# Patient Record
Sex: Female | Born: 1960 | Hispanic: No | Marital: Married | Smoking: Current every day smoker
Health system: Southern US, Community
[De-identification: ages and names within clinical notes are randomized; demographics above are authoritative.]

## PROBLEM LIST (undated history)

## (undated) DIAGNOSIS — E7800 Pure hypercholesterolemia, unspecified: Secondary | ICD-10-CM

## (undated) DIAGNOSIS — I639 Cerebral infarction, unspecified: Secondary | ICD-10-CM

## (undated) HISTORY — PX: ABDOMINAL HYSTERECTOMY: SHX81

---

## 2002-02-25 ENCOUNTER — Other Ambulatory Visit: Admission: RE | Admit: 2002-02-25 | Discharge: 2002-02-25 | Payer: Self-pay | Admitting: Obstetrics and Gynecology

## 2009-01-30 ENCOUNTER — Emergency Department (HOSPITAL_BASED_OUTPATIENT_CLINIC_OR_DEPARTMENT_OTHER): Admission: EM | Admit: 2009-01-30 | Discharge: 2009-01-30 | Payer: Self-pay | Admitting: Emergency Medicine

## 2012-04-27 IMAGING — CR LEFT HIP - COMPLETE 2+ VIEW
3 series · 3 of 3 positions shown · non-contrast
Comparison: None.

CLINICAL DATA: Left hip pain.

EXAM:
LEFT HIP - COMPLETE 2+ VIEW

[t pelvis a.p.]
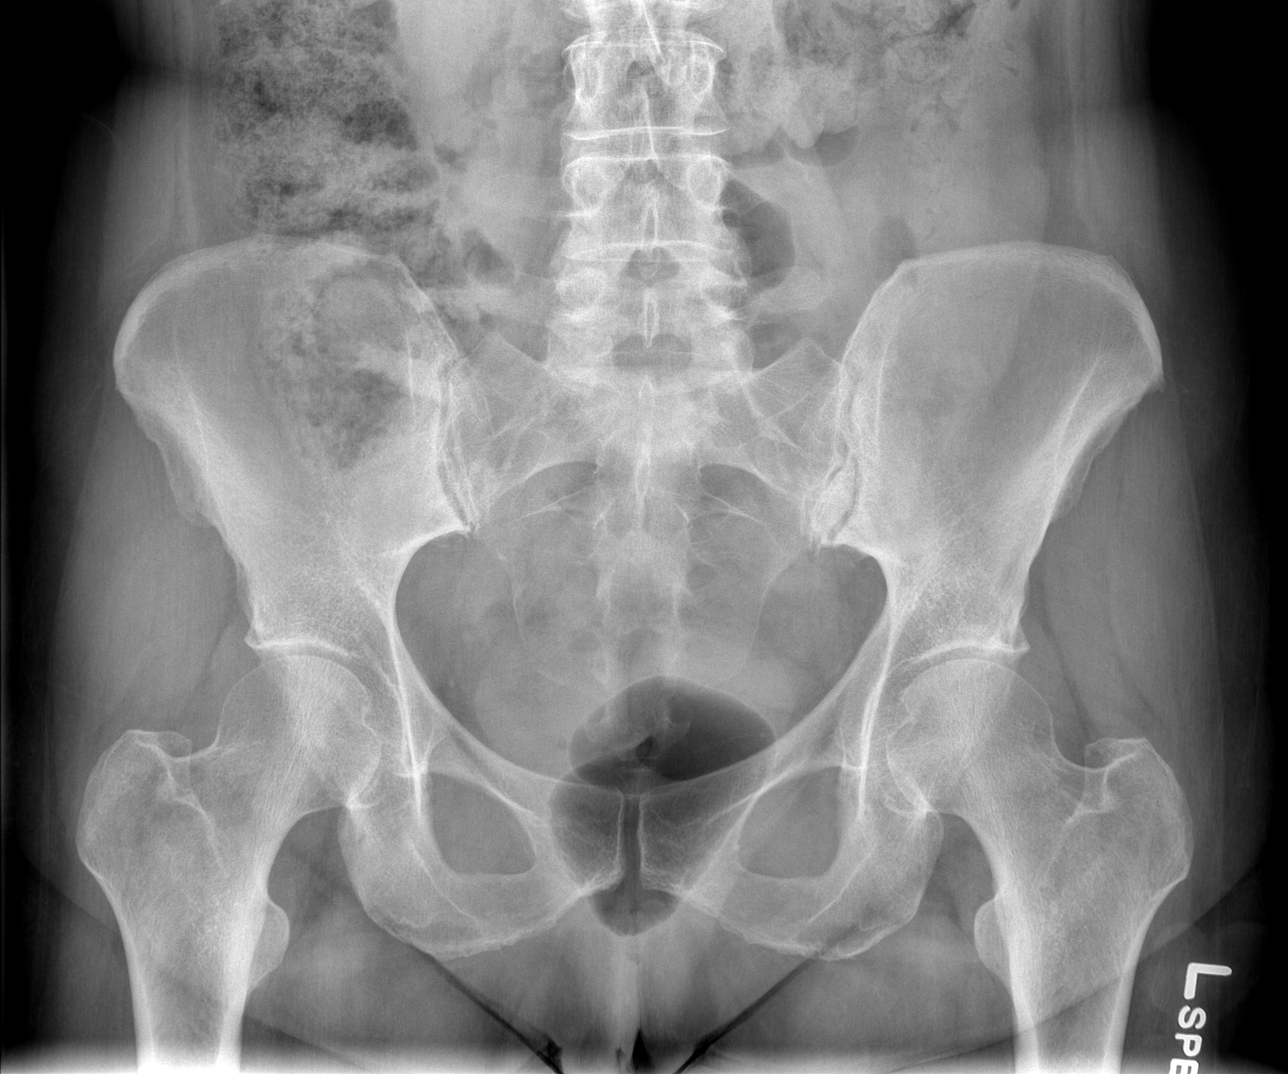

[t hip ap left]
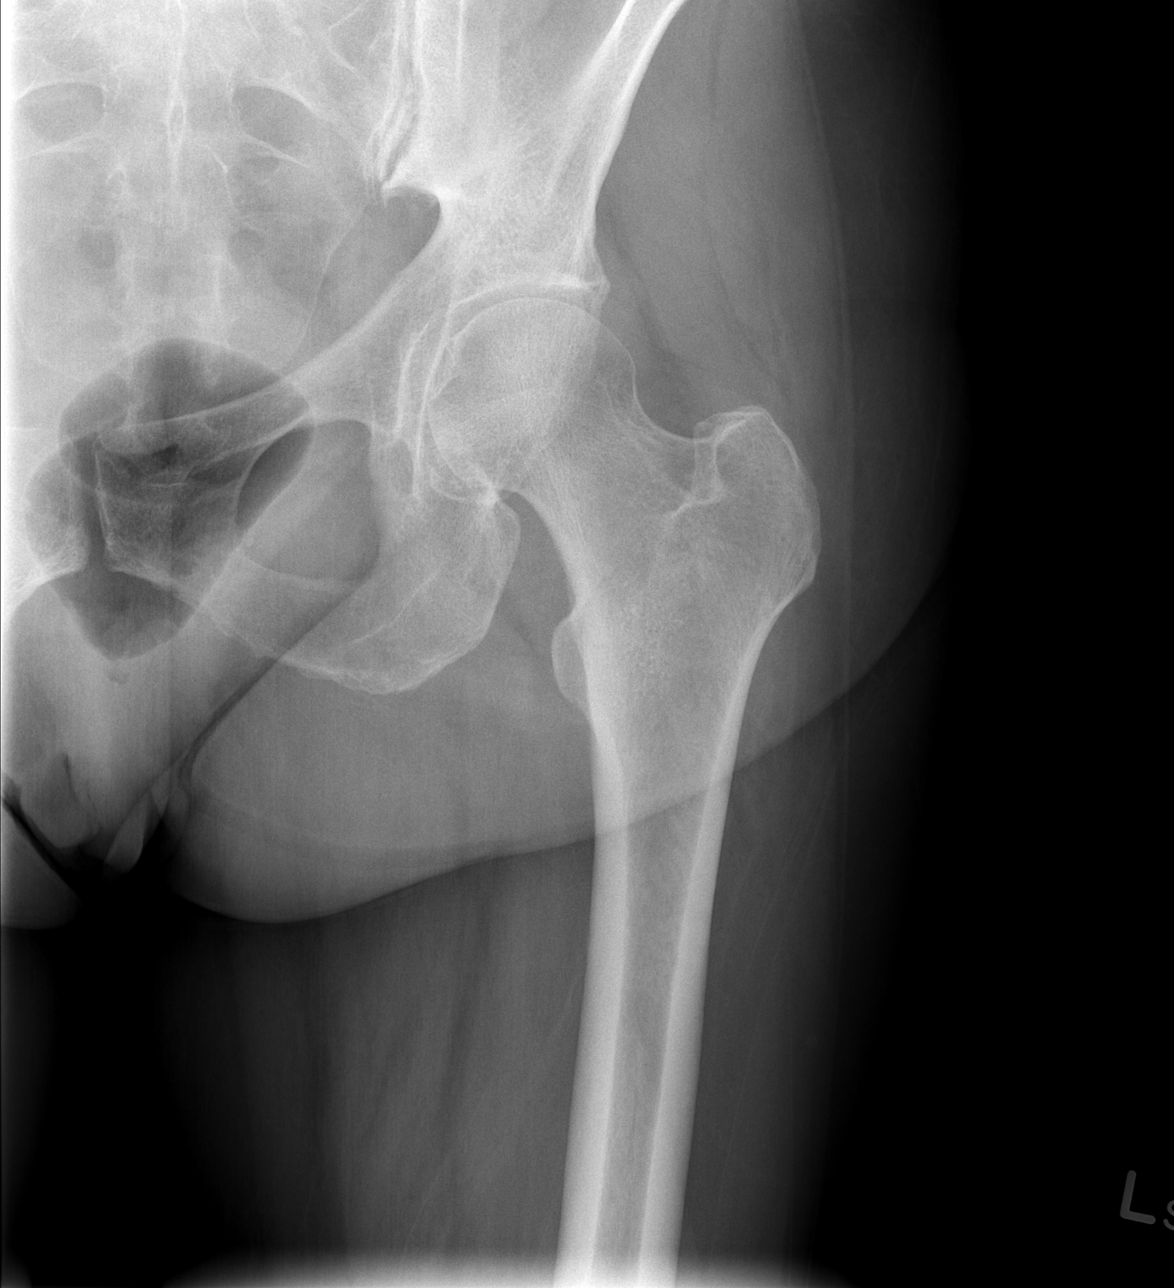

[t hip frog leg left]
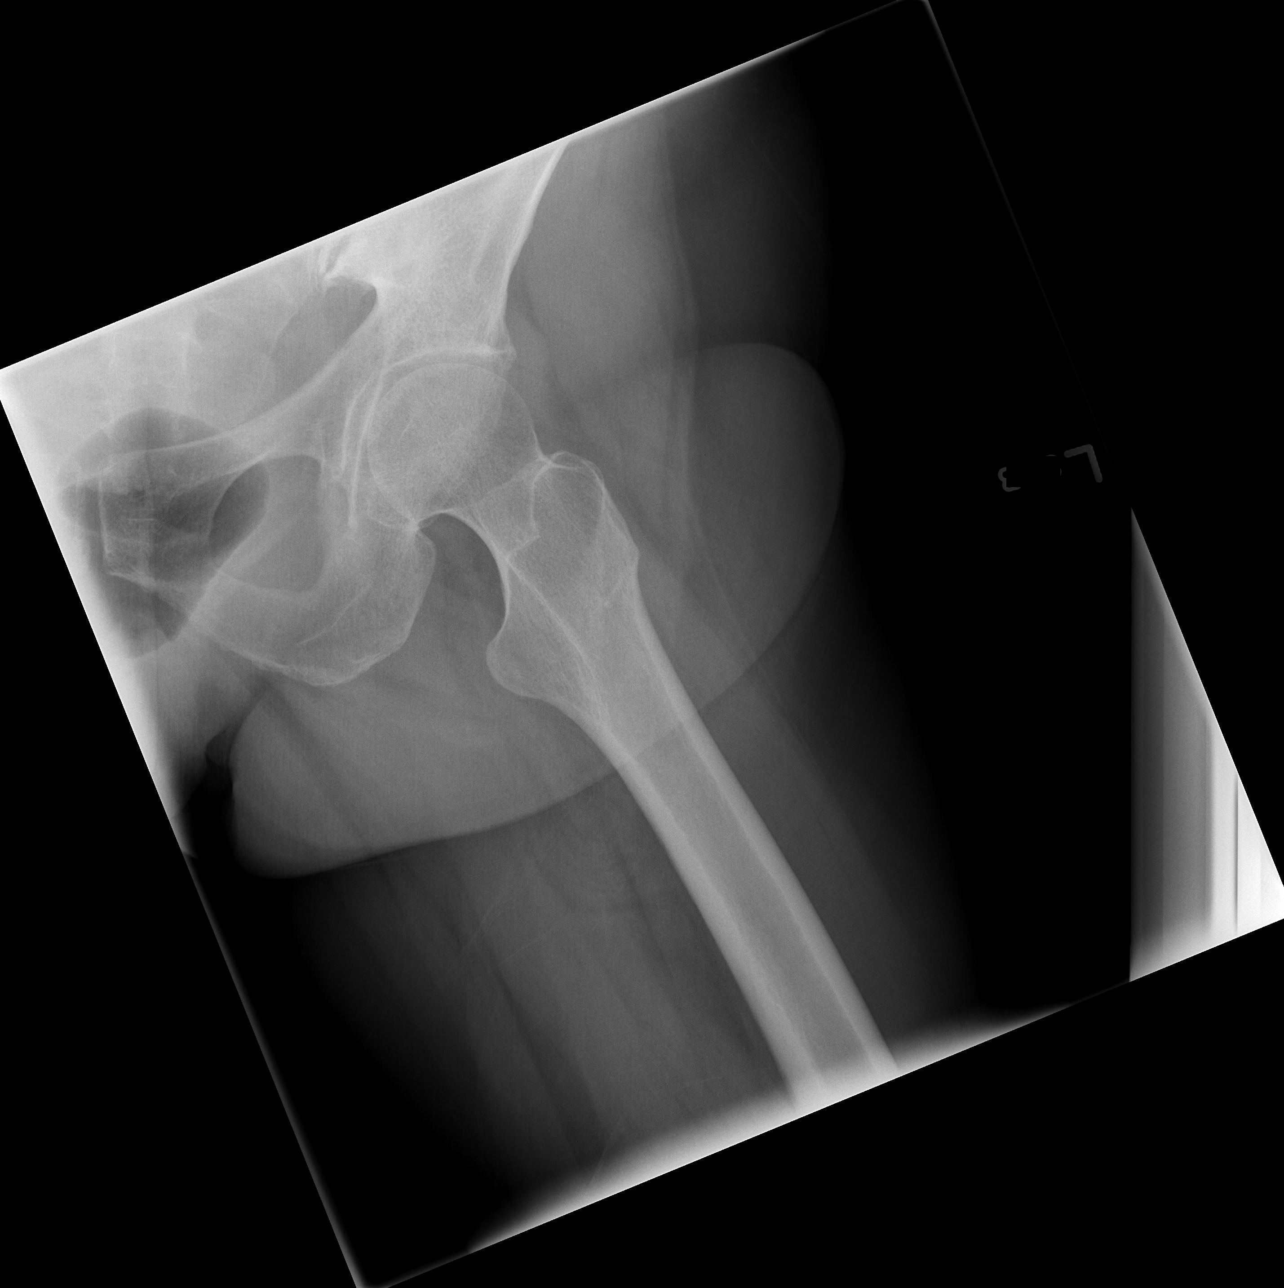

[3 of 3 positions shown; findings below may reference images not displayed]

FINDINGS: Both hips are normally located. No acute fracture or plain film
evidence of avascular necrosis. The pubic symphysis and SI joints
are intact.
IMPRESSION: No acute bony findings.

## 2013-07-08 ENCOUNTER — Encounter (HOSPITAL_BASED_OUTPATIENT_CLINIC_OR_DEPARTMENT_OTHER): Payer: Self-pay | Admitting: *Deleted

## 2013-07-08 ENCOUNTER — Emergency Department (HOSPITAL_BASED_OUTPATIENT_CLINIC_OR_DEPARTMENT_OTHER): Payer: Worker's Compensation

## 2013-07-08 ENCOUNTER — Emergency Department (HOSPITAL_BASED_OUTPATIENT_CLINIC_OR_DEPARTMENT_OTHER)
Admission: EM | Admit: 2013-07-08 | Discharge: 2013-07-08 | Disposition: A | Discharge status: Home / Self Care | Payer: Worker's Compensation | Attending: Emergency Medicine | Admitting: Emergency Medicine

## 2013-07-08 DIAGNOSIS — E7800 Pure hypercholesterolemia, unspecified: Secondary | ICD-10-CM | POA: Diagnosis not present

## 2013-07-08 DIAGNOSIS — F17200 Nicotine dependence, unspecified, uncomplicated: Secondary | ICD-10-CM | POA: Diagnosis not present

## 2013-07-08 DIAGNOSIS — M79609 Pain in unspecified limb: Secondary | ICD-10-CM | POA: Diagnosis present

## 2013-07-08 DIAGNOSIS — Z79899 Other long term (current) drug therapy: Secondary | ICD-10-CM | POA: Insufficient documentation

## 2013-07-08 DIAGNOSIS — M5430 Sciatica, unspecified side: Secondary | ICD-10-CM | POA: Insufficient documentation

## 2013-07-08 DIAGNOSIS — M5432 Sciatica, left side: Secondary | ICD-10-CM

## 2013-07-08 HISTORY — DX: Pure hypercholesterolemia, unspecified: E78.00

## 2013-07-08 MED ORDER — OXYCODONE-ACETAMINOPHEN 5-325 MG PO TABS: 1.0000 | ORAL_TABLET | Freq: Four times a day (QID) | ORAL | Status: DC | PRN

## 2013-07-08 MED ORDER — OXYCODONE-ACETAMINOPHEN 5-325 MG PO TABS
1.0000 | ORAL_TABLET | Freq: Once | ORAL | Status: AC
  Administered 2013-07-08: 1 via ORAL
  Filled 2013-07-08: qty 1

## 2013-07-08 NOTE — ED Provider Notes (Signed)
CSN: [id number]     Arrival date & time 07/08/13  1717 History   First MD Initiated Contact with Patient 07/08/13 1750     Chief Complaint  Patient presents with  . Leg Pain   (Consider location/radiation/quality/duration/timing/severity/associated sxs/prior Treatment) HPI Pt presenting with c/o left hip pain and pain radiating from left buttock down her leg.  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  Since then she has had pain in left hip.  Intermittent numbness of foot.  No weakness of leg.  She was seen by the physician recommended through her work- was given injection for pain and steroid dose pack.  She states the pain is not improved.  She does not know of any other injures.  No pain in low back.  No weakness of legs, no incontinence of bowel or bladder, no fevers.  There are no other associated systemic symptoms, there are no other alleviating or modifying factors.   Past Medical History  Diagnosis Date  . High cholesterol    Past Surgical History  Procedure Laterality Date  . Abdominal hysterectomy     No family history on file. History  Substance Use Topics  . Smoking status: Current Every Day Smoker    Types: Cigarettes  . Smokeless tobacco: Not on file  . Alcohol Use: No   OB History   Grav Para Term Preterm Abortions TAB SAB Ect Mult Living                 Review of Systems ROS reviewed and all otherwise negative except for mentioned in HPI  Allergies  Review of patient's allergies indicates no known allergies.  Home Medications   Current Outpatient Rx  Name  Route  Sig  Dispense  Refill  . rosuvastatin (CRESTOR) 20 MG tablet   Oral   Take 20 mg by mouth daily.         Marland Kitchen oxyCODONE-acetaminophen (PERCOCET/ROXICET) 5-325 MG per tablet   Oral   Take 1-2 tablets by mouth every 6 (six) hours as needed for pain.   15 tablet   0    BP 143/82  Pulse 88  Temp(Src) 98.5 F (36.9 C)  (Oral)  Resp 18  Ht 5\' 5"  (1.651 m)  Wt 158 lb (71.668 kg)  BMI 26.29 kg/m2  SpO2 96% Vitals reviewed Physical Exam Physical Examination: General appearance - alert, well appearing, and in no distress Mental status - alert, oriented to person, place, and time Eyes - no conjunctival injection, no scleral icterus Mouth - mucous membranes moist, pharynx normal without lesions Chest - clear to auscultation, no wheezes, rales or rhonchi, symmetric air entry Heart - normal rate, regular rhythm, normal S1, S2, no murmurs, rubs, clicks or gallops Abdomen - soft, nontender, nondistended, no masses or organomegaly Back exam - no midline tenderness to palpation of c/t/l spine, no paraspinal tenderness, ttp over sciatic notch of left buttock Neurological - alert, oriented, normal speech, strength 5/5 in lower extremities bilaterally, sensation intact Musculoskeletal - mild pain with internal rotation of left hip, otherwise no joint tenderness, deformity or swelling Extremities - peripheral pulses normal, no pedal edema, no clubbing or cyanosis Skin - normal coloration and turgor, no rashes  ED Course  Procedures (including critical care time)  7:38 PM there is a delay in reading the xray- images reviewed and interpreted by me as normal.  I have called radiology reading room and now  they have read the xray as normal as well.  Labs Review Labs Reviewed - No data to display Imaging Review Dg Hip Complete Left  07/08/2013   CLINICAL DATA:  Left hip pain.  EXAM: LEFT HIP - COMPLETE 2+ VIEW  COMPARISON:  None.  FINDINGS: Both hips are normally located. No acute fracture or plain film evidence of avascular necrosis. The pubic symphysis and SI joints are intact.  IMPRESSION: No acute bony findings.   Electronically Signed   By: Loralie Champagne M.D.   On: 07/08/2013 19:42    MDM   1. Sciatica neuralgia, left    Pt presenting with c/o pain in left buttock and hip with radiation down left leg- symptoms  most c/w sciatic nerve pain.  No signs or symptoms of cauda equina, epidural abscess or other emergent condtion at this time.  Pt treated with pain med- has already been on a steroid taper.  She also has rx for naproxen which she should continue taking.  Percocet for breakthrough pain.  Discharged with strict return precautions.  Pt agreeable with plan.    Ethelda Chick, MD 07/08/13 2137

## 2013-07-08 NOTE — ED Notes (Signed)
Pain in her left hip since going on a shopping trip. She has been seen numerous times and given steroids with no relief.

## 2013-07-08 NOTE — ED Notes (Signed)
MD at bedside. 

## 2020-04-03 ENCOUNTER — Emergency Department (HOSPITAL_BASED_OUTPATIENT_CLINIC_OR_DEPARTMENT_OTHER): Payer: PRIVATE HEALTH INSURANCE

## 2020-04-03 ENCOUNTER — Inpatient Hospital Stay (HOSPITAL_COMMUNITY): Payer: PRIVATE HEALTH INSURANCE

## 2020-04-03 ENCOUNTER — Encounter (HOSPITAL_BASED_OUTPATIENT_CLINIC_OR_DEPARTMENT_OTHER): Payer: Self-pay | Admitting: Emergency Medicine

## 2020-04-03 ENCOUNTER — Inpatient Hospital Stay (HOSPITAL_BASED_OUTPATIENT_CLINIC_OR_DEPARTMENT_OTHER)
Admission: EM | Admit: 2020-04-03 | Discharge: 2020-04-07 | DRG: 065 | Disposition: A | Discharge status: Rehabilitation Facility | Payer: PRIVATE HEALTH INSURANCE | Attending: Internal Medicine | Admitting: Internal Medicine

## 2020-04-03 DIAGNOSIS — Z8679 Personal history of other diseases of the circulatory system: Secondary | ICD-10-CM

## 2020-04-03 DIAGNOSIS — J439 Emphysema, unspecified: Secondary | ICD-10-CM | POA: Diagnosis present

## 2020-04-03 DIAGNOSIS — I671 Cerebral aneurysm, nonruptured: Secondary | ICD-10-CM | POA: Diagnosis present

## 2020-04-03 DIAGNOSIS — E7800 Pure hypercholesterolemia, unspecified: Secondary | ICD-10-CM | POA: Diagnosis present

## 2020-04-03 DIAGNOSIS — R29701 NIHSS score 1: Secondary | ICD-10-CM | POA: Diagnosis present

## 2020-04-03 DIAGNOSIS — R3129 Other microscopic hematuria: Secondary | ICD-10-CM | POA: Diagnosis present

## 2020-04-03 DIAGNOSIS — G8191 Hemiplegia, unspecified affecting right dominant side: Secondary | ICD-10-CM

## 2020-04-03 DIAGNOSIS — Z90710 Acquired absence of both cervix and uterus: Secondary | ICD-10-CM | POA: Diagnosis not present

## 2020-04-03 DIAGNOSIS — Z20822 Contact with and (suspected) exposure to covid-19: Secondary | ICD-10-CM | POA: Diagnosis present

## 2020-04-03 DIAGNOSIS — I6349 Cerebral infarction due to embolism of other cerebral artery: Secondary | ICD-10-CM | POA: Diagnosis not present

## 2020-04-03 DIAGNOSIS — G8194 Hemiplegia, unspecified affecting left nondominant side: Secondary | ICD-10-CM | POA: Diagnosis present

## 2020-04-03 DIAGNOSIS — Z79891 Long term (current) use of opiate analgesic: Secondary | ICD-10-CM | POA: Diagnosis not present

## 2020-04-03 DIAGNOSIS — R590 Localized enlarged lymph nodes: Secondary | ICD-10-CM | POA: Diagnosis present

## 2020-04-03 DIAGNOSIS — Z79899 Other long term (current) drug therapy: Secondary | ICD-10-CM | POA: Diagnosis not present

## 2020-04-03 DIAGNOSIS — I10 Essential (primary) hypertension: Secondary | ICD-10-CM | POA: Diagnosis present

## 2020-04-03 DIAGNOSIS — I69351 Hemiplegia and hemiparesis following cerebral infarction affecting right dominant side: Secondary | ICD-10-CM | POA: Diagnosis not present

## 2020-04-03 DIAGNOSIS — R531 Weakness: Secondary | ICD-10-CM

## 2020-04-03 DIAGNOSIS — E785 Hyperlipidemia, unspecified: Secondary | ICD-10-CM | POA: Diagnosis present

## 2020-04-03 DIAGNOSIS — J984 Other disorders of lung: Secondary | ICD-10-CM | POA: Diagnosis not present

## 2020-04-03 DIAGNOSIS — Z720 Tobacco use: Secondary | ICD-10-CM | POA: Diagnosis not present

## 2020-04-03 DIAGNOSIS — R319 Hematuria, unspecified: Secondary | ICD-10-CM

## 2020-04-03 DIAGNOSIS — I6340 Cerebral infarction due to embolism of unspecified cerebral artery: Secondary | ICD-10-CM | POA: Diagnosis present

## 2020-04-03 DIAGNOSIS — Z8673 Personal history of transient ischemic attack (TIA), and cerebral infarction without residual deficits: Secondary | ICD-10-CM | POA: Diagnosis not present

## 2020-04-03 DIAGNOSIS — I639 Cerebral infarction, unspecified: Secondary | ICD-10-CM | POA: Diagnosis not present

## 2020-04-03 DIAGNOSIS — I63413 Cerebral infarction due to embolism of bilateral middle cerebral arteries: Secondary | ICD-10-CM | POA: Diagnosis not present

## 2020-04-03 DIAGNOSIS — F17210 Nicotine dependence, cigarettes, uncomplicated: Secondary | ICD-10-CM | POA: Diagnosis present

## 2020-04-03 DIAGNOSIS — R569 Unspecified convulsions: Secondary | ICD-10-CM | POA: Diagnosis present

## 2020-04-03 DIAGNOSIS — K5901 Slow transit constipation: Secondary | ICD-10-CM

## 2020-04-03 DIAGNOSIS — R911 Solitary pulmonary nodule: Secondary | ICD-10-CM | POA: Diagnosis not present

## 2020-04-03 DIAGNOSIS — G459 Transient cerebral ischemic attack, unspecified: Secondary | ICD-10-CM | POA: Diagnosis present

## 2020-04-03 DIAGNOSIS — R918 Other nonspecific abnormal finding of lung field: Secondary | ICD-10-CM

## 2020-04-03 DIAGNOSIS — F17200 Nicotine dependence, unspecified, uncomplicated: Secondary | ICD-10-CM | POA: Diagnosis not present

## 2020-04-03 DIAGNOSIS — Z7982 Long term (current) use of aspirin: Secondary | ICD-10-CM | POA: Diagnosis not present

## 2020-04-03 HISTORY — DX: Cerebral infarction, unspecified: I63.9

## 2020-04-03 LAB — DIFFERENTIAL
Abs Immature Granulocytes: 0.02 10*3/uL (ref 0.00–0.07)
Basophils Absolute: 0.1 10*3/uL (ref 0.0–0.1)
Basophils Relative: 1 %
Eosinophils Absolute: 0.2 10*3/uL (ref 0.0–0.5)
Eosinophils Relative: 3 %
Immature Granulocytes: 0 %
Lymphocytes Relative: 27 %
Lymphs Abs: 2.2 10*3/uL (ref 0.7–4.0)
Monocytes Absolute: 0.8 10*3/uL (ref 0.1–1.0)
Monocytes Relative: 10 %
Neutro Abs: 4.8 10*3/uL (ref 1.7–7.7)
Neutrophils Relative %: 59 %

## 2020-04-03 LAB — PROTIME-INR
INR: 1.1 (ref 0.8–1.2)
Prothrombin Time: 13.4 seconds (ref 11.4–15.2)

## 2020-04-03 LAB — CBC
HCT: 41.3 % (ref 36.0–46.0)
Hemoglobin: 13.3 g/dL (ref 12.0–15.0)
MCH: 29.3 pg (ref 26.0–34.0)
MCHC: 32.2 g/dL (ref 30.0–36.0)
MCV: 91 fL (ref 80.0–100.0)
Platelets: 188 10*3/uL (ref 150–400)
RBC: 4.54 MIL/uL (ref 3.87–5.11)
RDW: 13.2 % (ref 11.5–15.5)
WBC: 8.1 10*3/uL (ref 4.0–10.5)
nRBC: 0 % (ref 0.0–0.2)

## 2020-04-03 LAB — CBG MONITORING, ED: Glucose-Capillary: 93 mg/dL (ref 70–99)

## 2020-04-03 LAB — COMPREHENSIVE METABOLIC PANEL
ALT: 22 U/L (ref 0–44)
AST: 18 U/L (ref 15–41)
Albumin: 4 g/dL (ref 3.5–5.0)
Alkaline Phosphatase: 108 U/L (ref 38–126)
Anion gap: 11 (ref 5–15)
BUN: 10 mg/dL (ref 8–23)
CO2: 24 mmol/L (ref 22–32)
Calcium: 8.9 mg/dL (ref 8.9–10.3)
Chloride: 107 mmol/L (ref 98–111)
Creatinine, Ser: 0.48 mg/dL (ref 0.44–1.00)
GFR calc Af Amer: >60 mL/min (ref >60)
GFR calc non Af Amer: >60 mL/min (ref >60)
Glucose, Bld: 101 mg/dL — ABNORMAL HIGH (ref 70–99)
Potassium: 3.8 mmol/L (ref 3.5–5.1)
Sodium: 142 mmol/L (ref 135–145)
Total Bilirubin: 0.7 mg/dL (ref 0.3–1.2)
Total Protein: 7.1 g/dL (ref 6.5–8.1)

## 2020-04-03 LAB — APTT: aPTT: 25 seconds (ref 24–36)

## 2020-04-03 LAB — ETHANOL: Alcohol, Ethyl (B): <10 mg/dL (ref <10)

## 2020-04-03 LAB — SARS CORONAVIRUS 2 BY RT PCR (HOSPITAL ORDER, PERFORMED IN ~~LOC~~ HOSPITAL LAB): SARS Coronavirus 2: NEGATIVE

## 2020-04-03 IMAGING — CR DG CHEST 2V
2 series · 2 of 2 positions shown · non-contrast
Comparison: Radiograph [DATE]

CLINICAL DATA: TIA

EXAM:
CHEST - 2 VIEW

[chest lat]
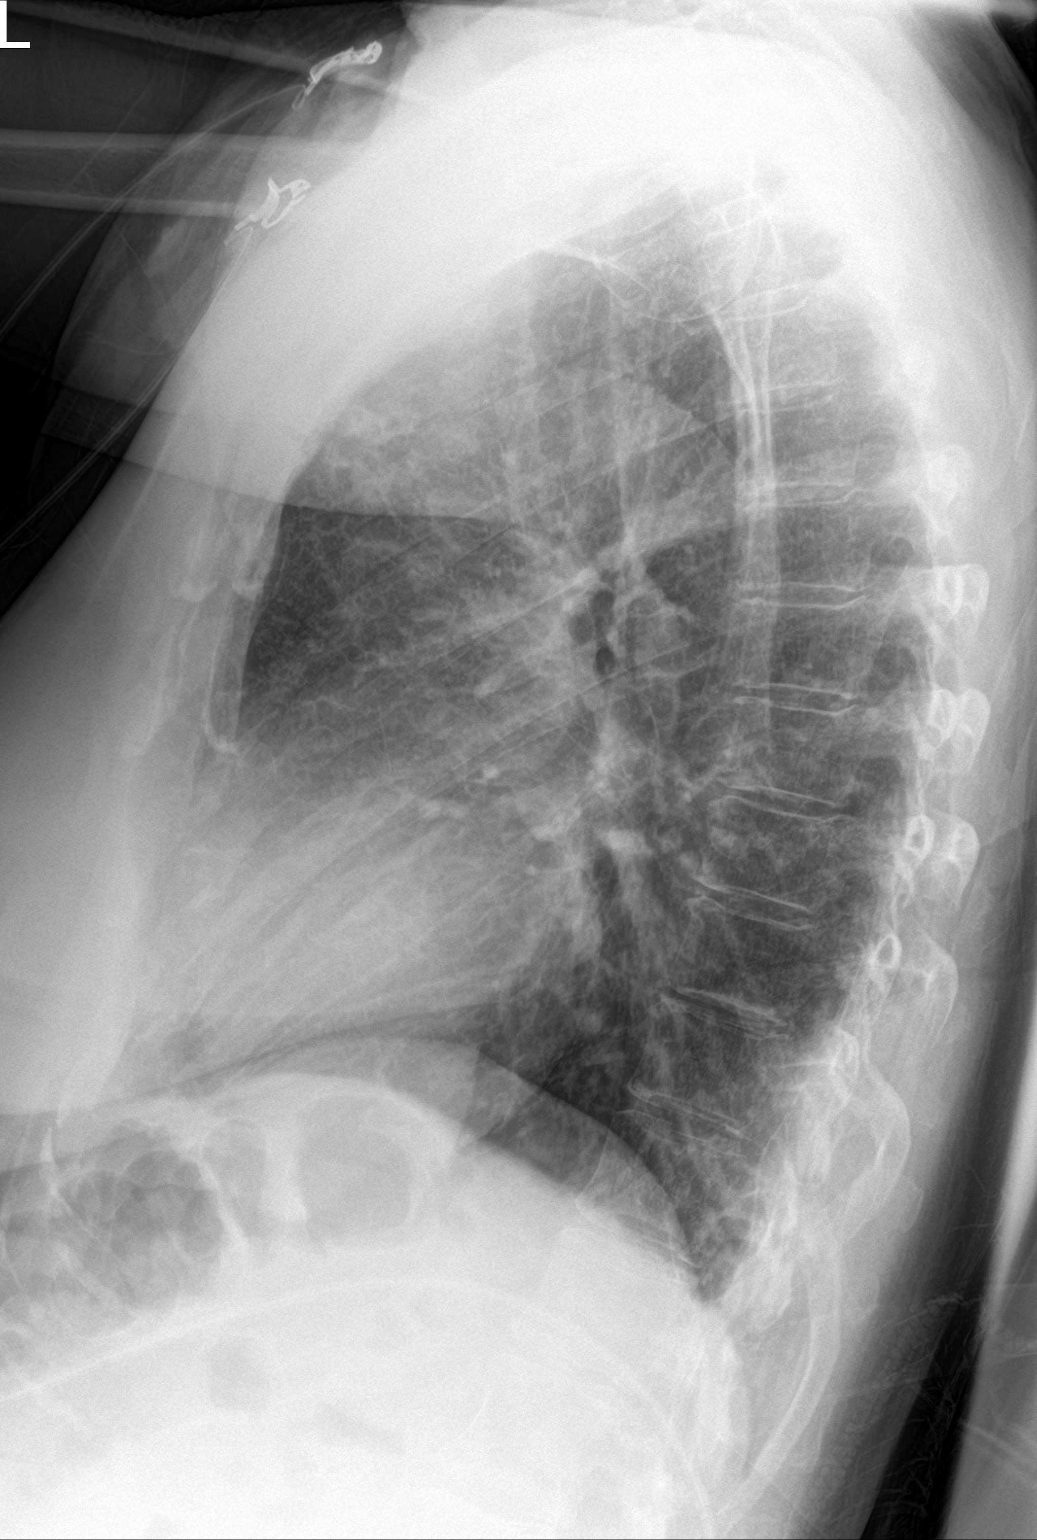

[chest ap]
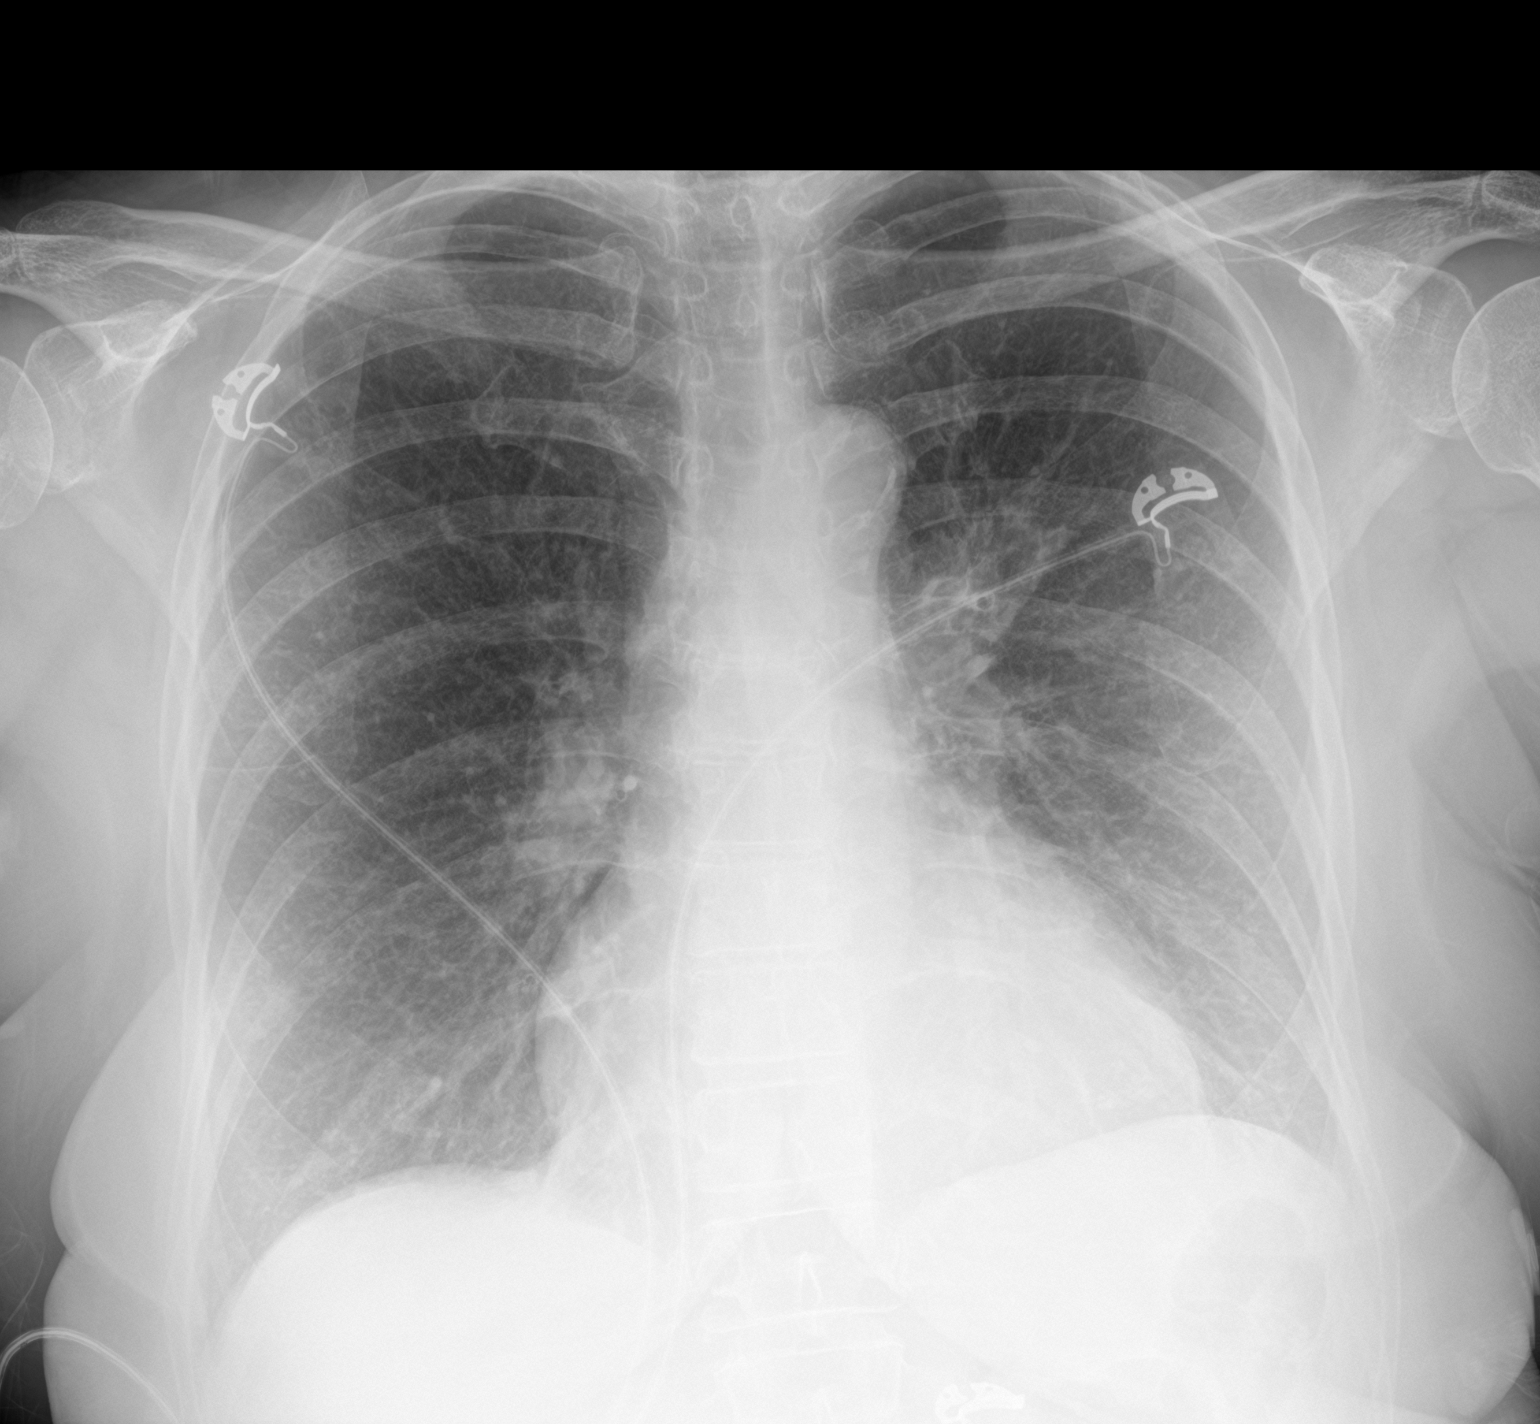

[2 of 2 positions shown; findings below may reference images not displayed]

FINDINGS: Low lung volumes with streaky basilar areas of atelectasis. A more
nodular masslike opacity is present in the right mid to lower lung.
There is mild central vascular congestion and central airways
thickening. No pneumothorax or visible effusion. The aorta is
calcified. The remaining cardiomediastinal contours are
unremarkable. No acute osseous or soft tissue abnormality.
IMPRESSION: 1. Low lung volumes with streaky basilar atelectasis.
2. Additional central vascular congestion, airways thickening, and
few septal lines could reflect developing interstitial edema.
3. Nodular opacity in the right mid to lower lung, recommend further
evaluation with CT imaging.

## 2020-04-03 IMAGING — CT CT HEAD W/O CM
3 series · 15 of 47 positions shown, 18 images · non-contrast
Comparison: [DATE]

CLINICAL DATA: Right-sided weakness/numbness which has resolved.
Previous history of coil embolization of intracerebral aneurysms.

EXAM:
CT HEAD WITHOUT CONTRAST
TECHNIQUE: Contiguous axial images were obtained from the base of the skull
through the vertex without intravenous contrast.

[Series 2: head wo · axial · 0.41mm/px · z∈[-164,-39]mm · 9 of 30 slices shown, 12 images]
[im 3/30  brain]
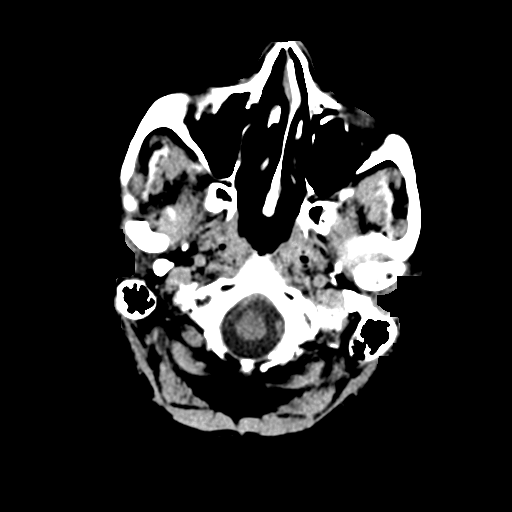
[im 3/30  bone]
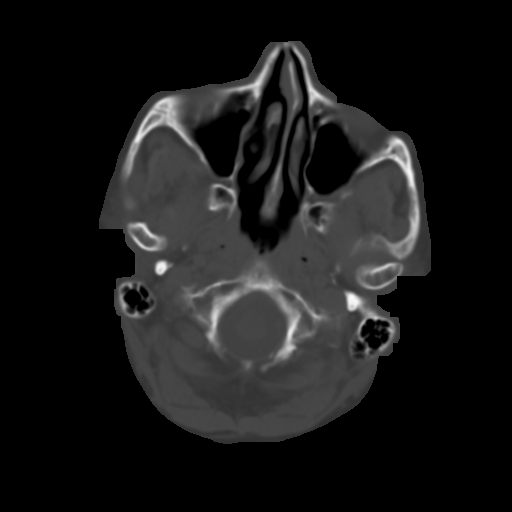
[im 6/30  brain]
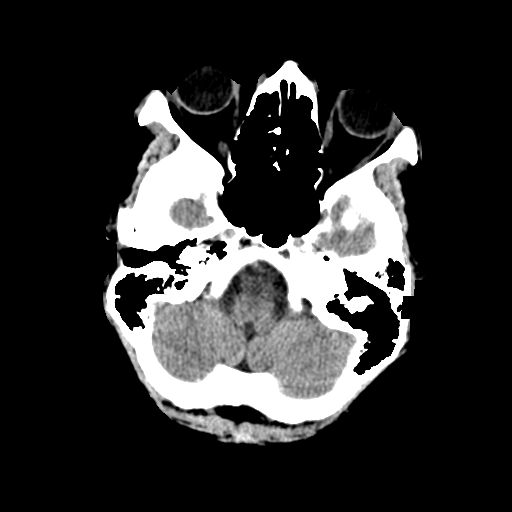
[im 9/30  brain]
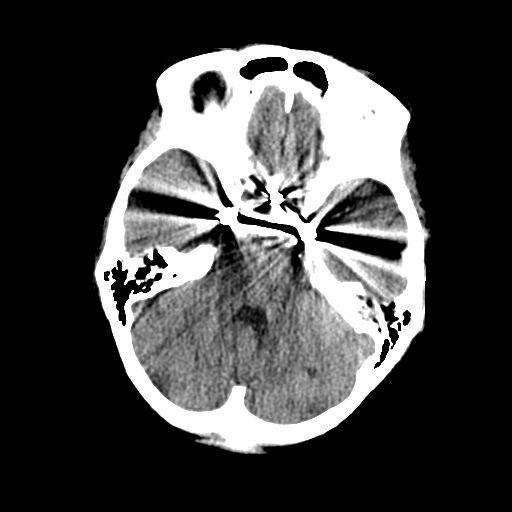
[im 12/30  brain]
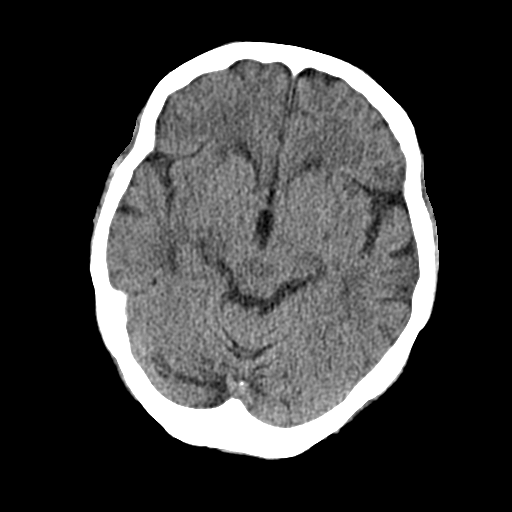
[im 16/30  brain]
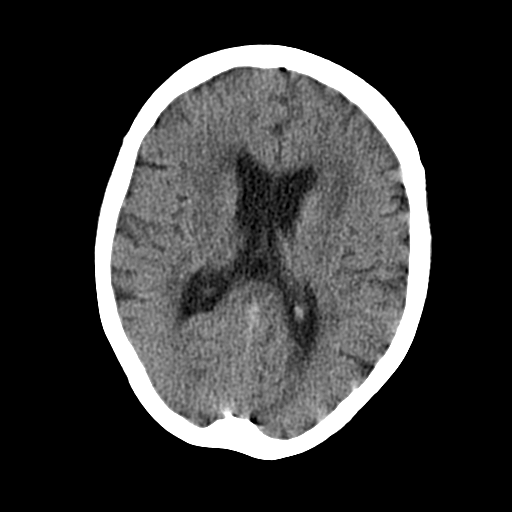
[im 16/30  bone]
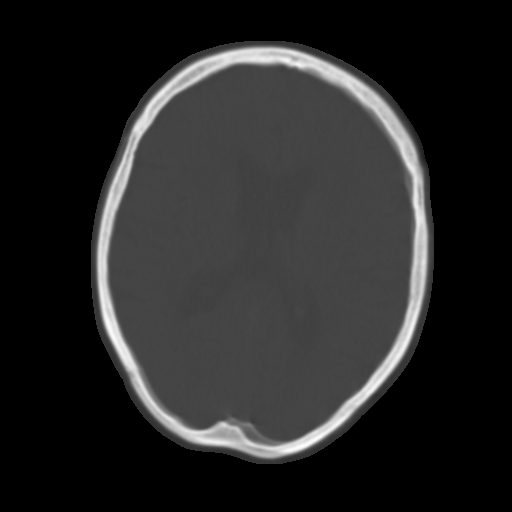
[im 19/30  brain]
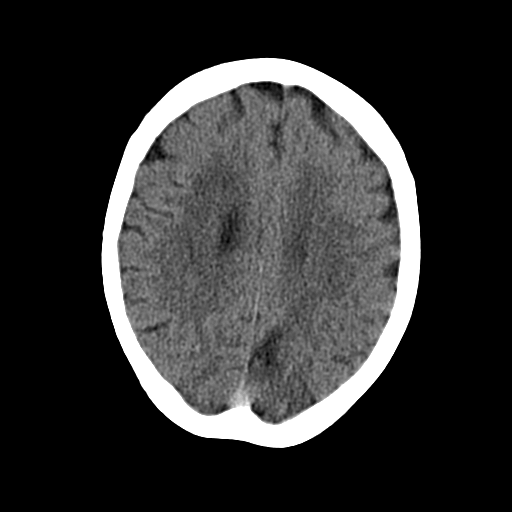
[im 22/30  brain]
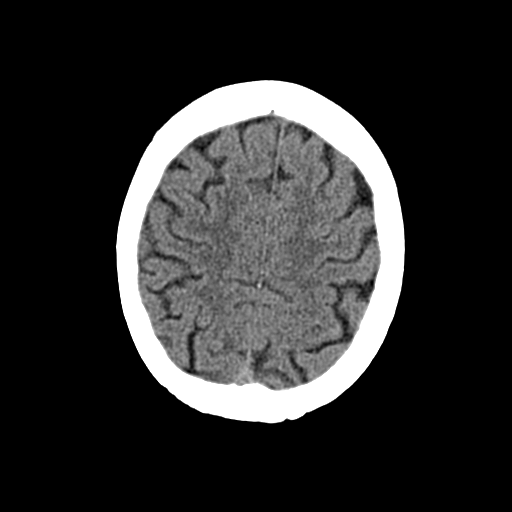
[im 25/30  brain]
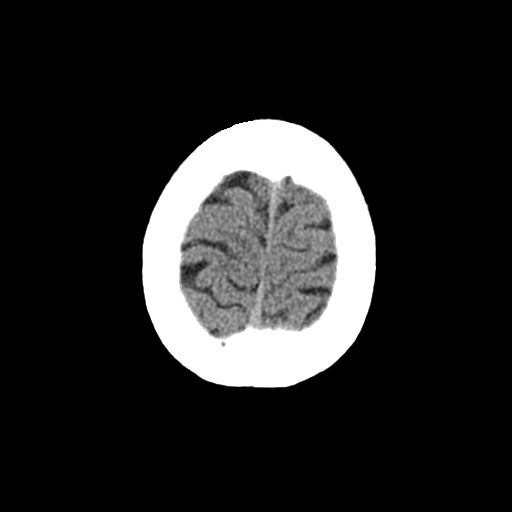
[im 28/30  brain]
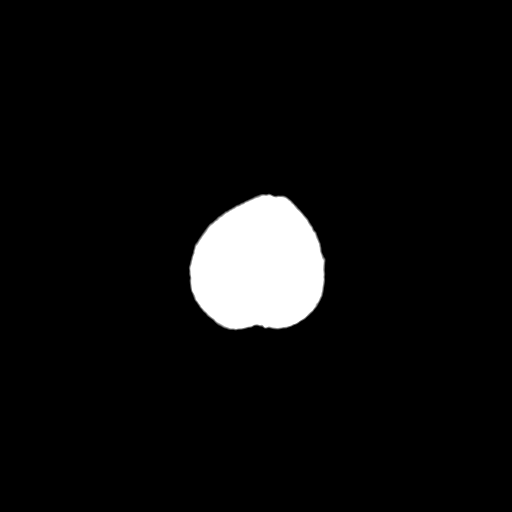
[im 28/30  bone]
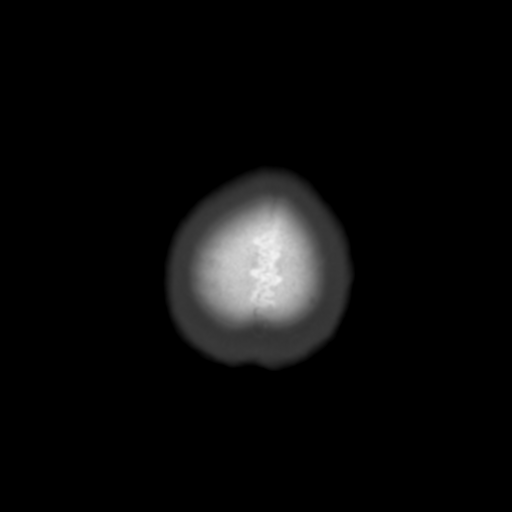

[Series 4: cor soft · coronal · 0.32mm/px · 3 of 66 slices shown]
[im 22/66  brain]
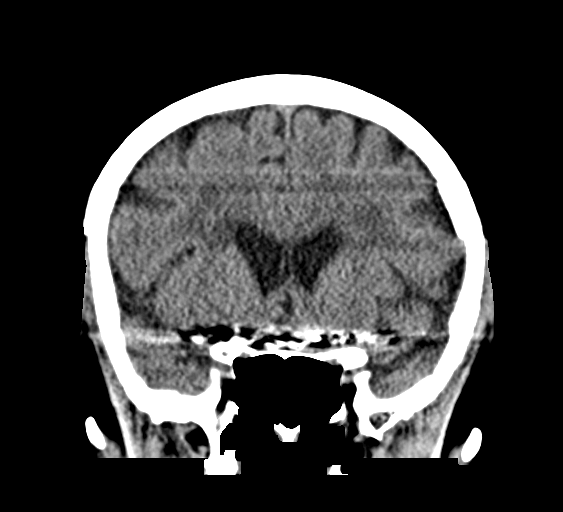
[im 29/66  brain]
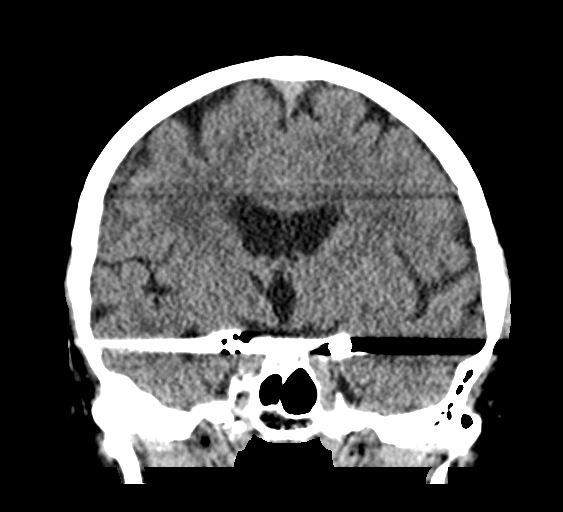
[im 37/66  brain]
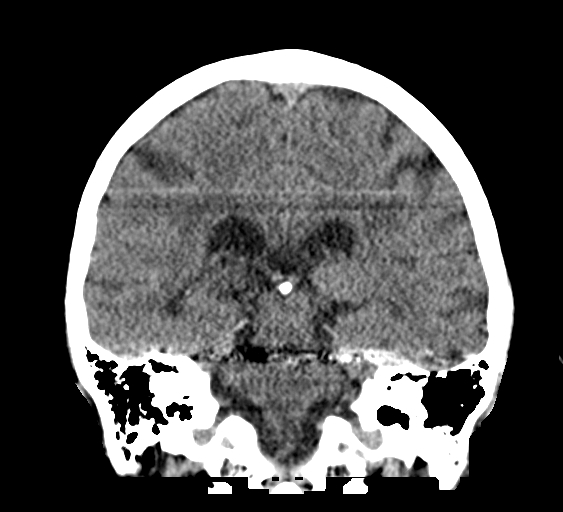

[Series 5: sag soft · sagittal · 0.32mm/px · 3 of 54 slices shown]
[im 18/54  brain]
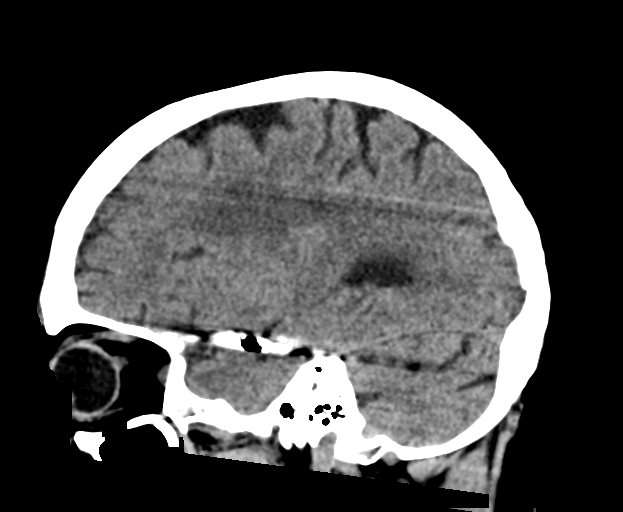
[im 27/54  brain]
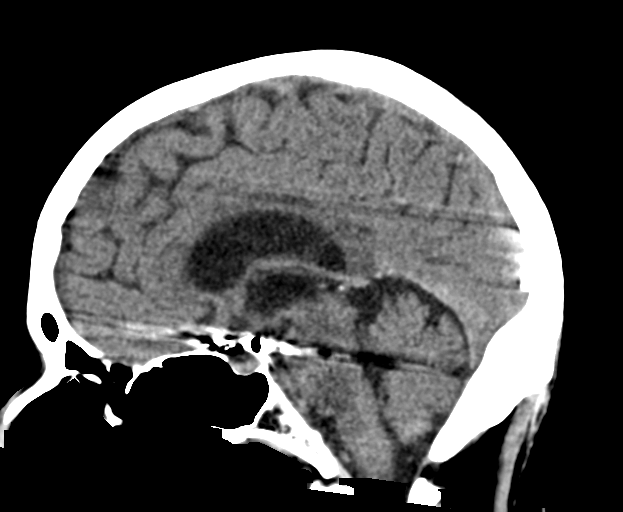
[im 36/54  brain]
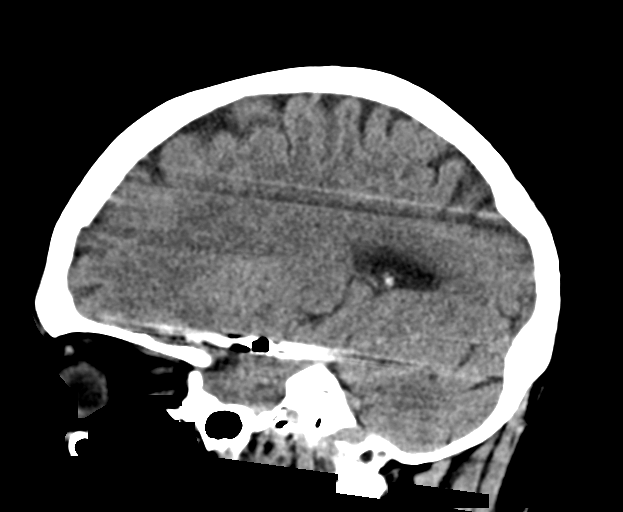

[15 of 47 positions shown; findings below may reference images not displayed]

FINDINGS: Brain: Ventricles, cisterns and other CSF spaces are within normal.
There is evidence of chronic ischemic microvascular disease. There
is a small old left occipital infarct. Possible old lacunar infarct
over the left cerebellum. Moderate streak artifact from patient's
previous aneurysm coiling involving bilateral posterior
communicating arteries as well as region of the anterior
communicating artery. No evidence of mass, mass effect or acute
hemorrhage. No evidence of acute infarction.

Vascular: Embolization coils as described.

Skull: Normal. Negative for fracture or focal lesion.

Sinuses/Orbits: No acute finding.

Other: None.
IMPRESSION: 1.  No acute findings.

2. Evidence of previous coil embolization of multiple intracerebral
aneurysms as described.

3. Chronic ischemic microvascular disease. Possible old left
cerebellar lacunar infarct as well as small old left occipital
infarct.

## 2020-04-03 IMAGING — MR MR MRA NECK W/O CM
2 series · 18 of 48 positions shown · IV contrast (gadavist)
Comparison: None.

CLINICAL DATA: TIA. Right-sided numbness and weakness. History of
aneurysm coiling.

EXAM:
MRI HEAD WITHOUT AND WITH CONTRAST
MRA HEAD WITHOUT   CONTRAST
MRA NECK WITHOUT   CONTRAST
TECHNIQUE: Multiplanar, multiecho pulse sequences of the brain and surrounding
structures were obtained without and with intravenous contrast.
Angiographic images of the head were obtained using MRA technique
without contrast. Angiographic sequences of the neck and surrounding
structures were obtained without intravenous contrast.
CONTRAST:  7.3mL GADAVIST GADOBUTROL 1 MMOL/ML IV SOLN

[Series 4: fl_tof_2d · axial · 3.0mm · 0.39mm/px · z∈[-251,-42]mm · 17 of 110 slices shown]
[im 1/110]
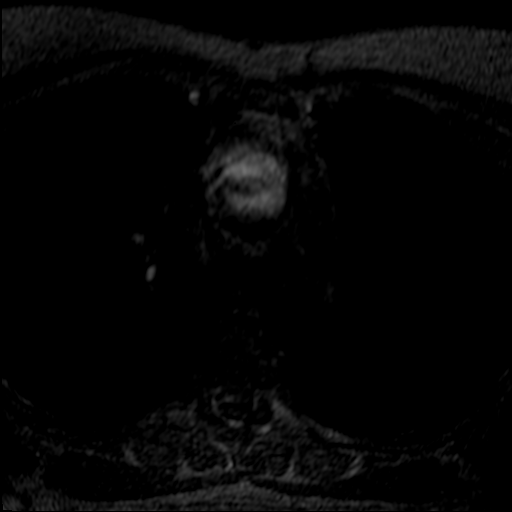
[im 3/110]
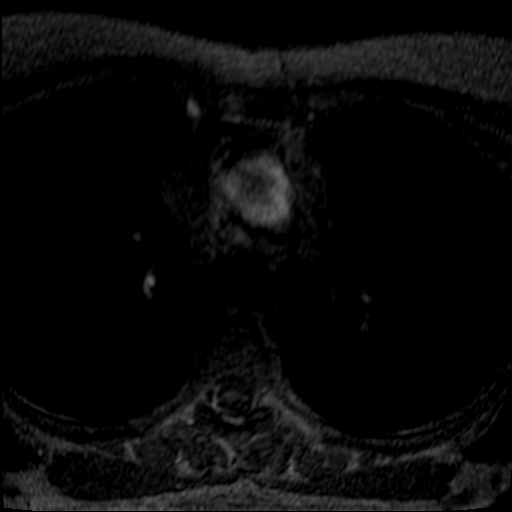
[im 5/110]
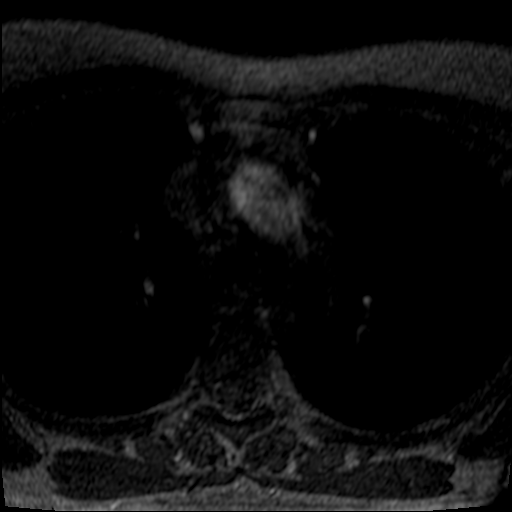
[im 8/110]
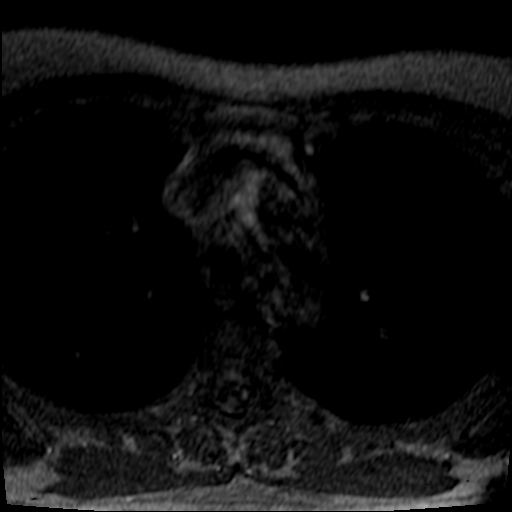
[im 10/110]
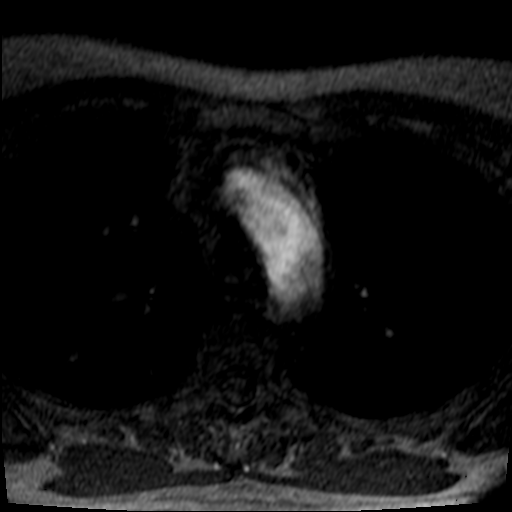
[im 12/110]
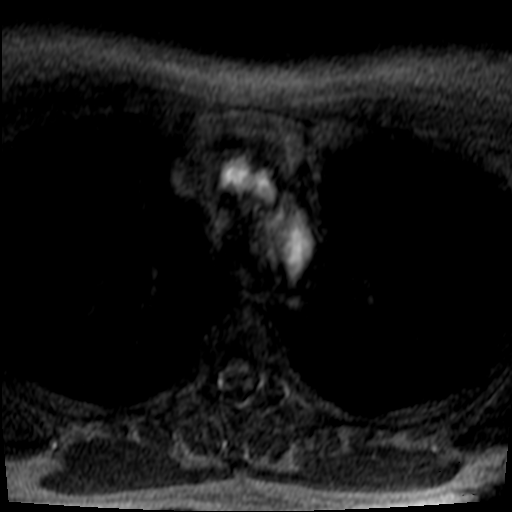
[im 15/110]
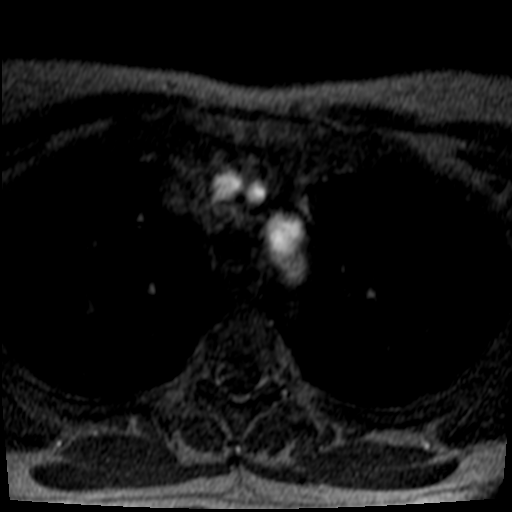
[im 17/110]
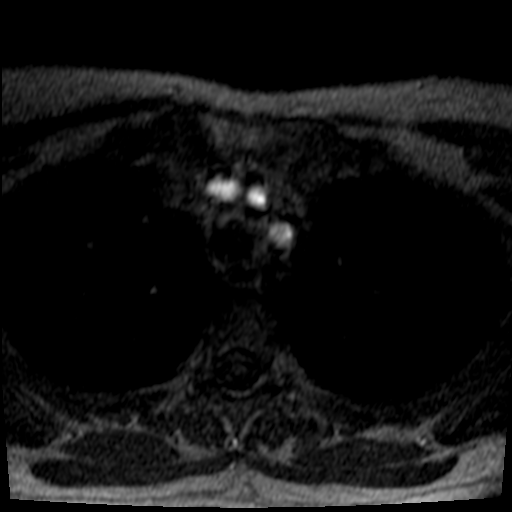
[im 19/110]
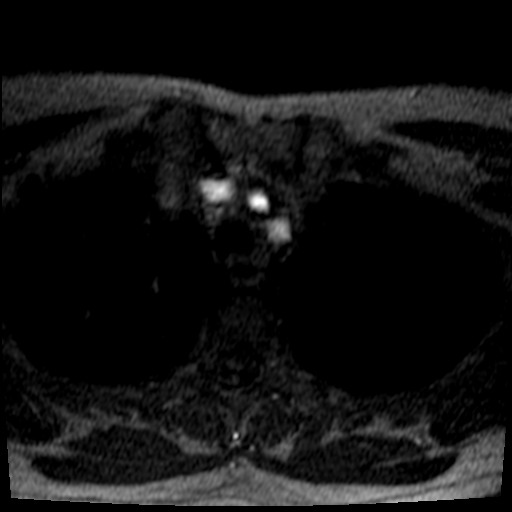
[im 34/110]
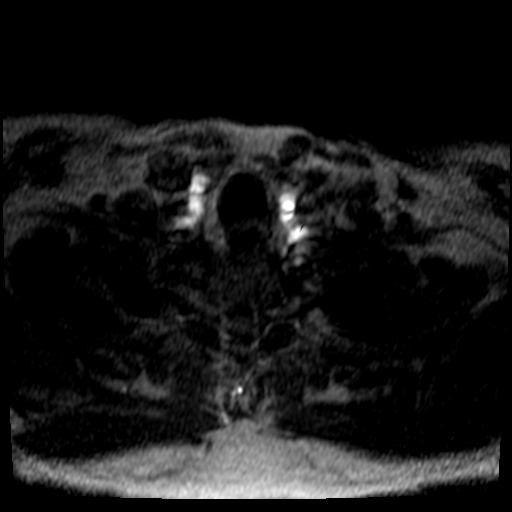
[im 48/110]
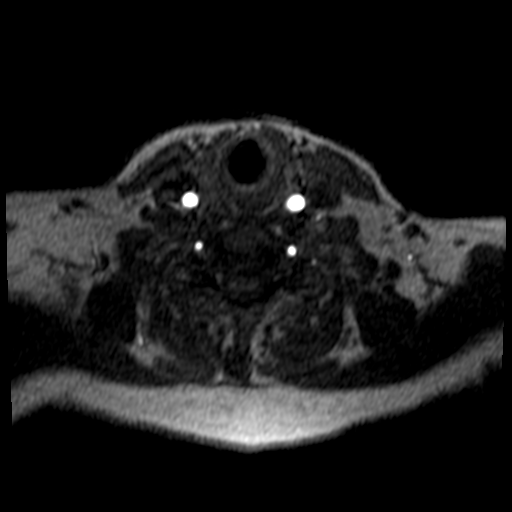
[im 55/110]
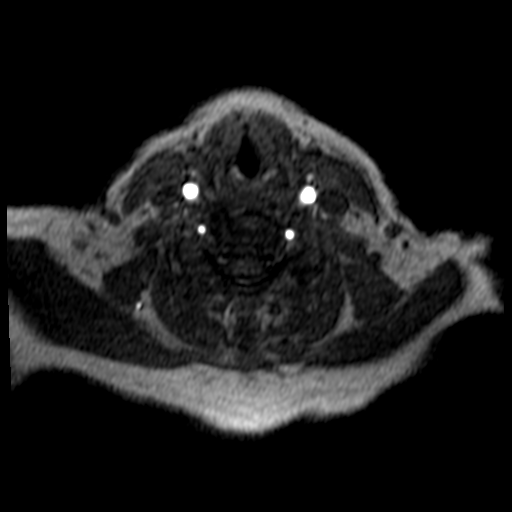
[im 62/110]
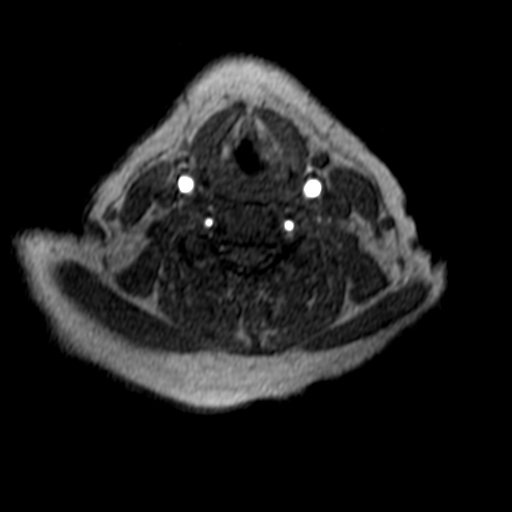
[im 76/110]
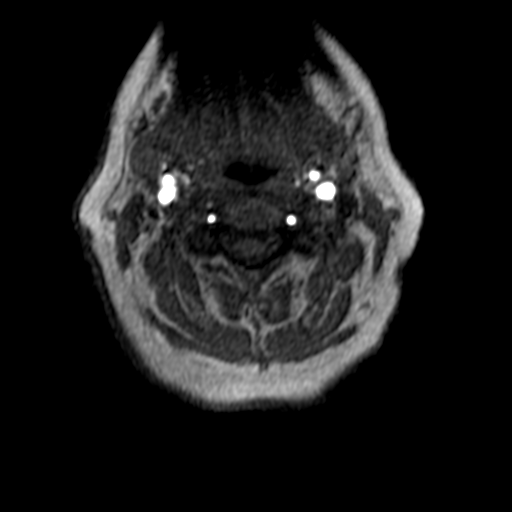
[im 91/110]
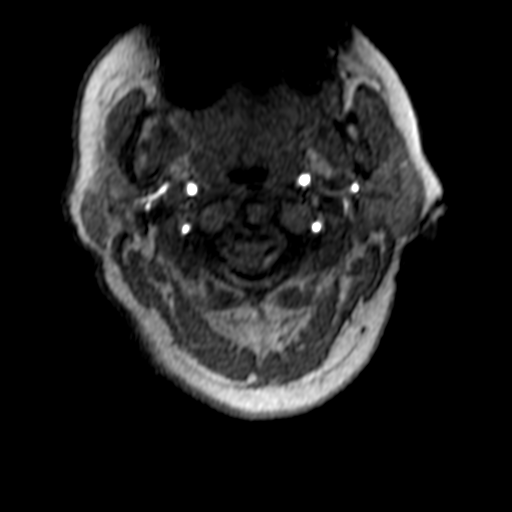
[im 93/110]
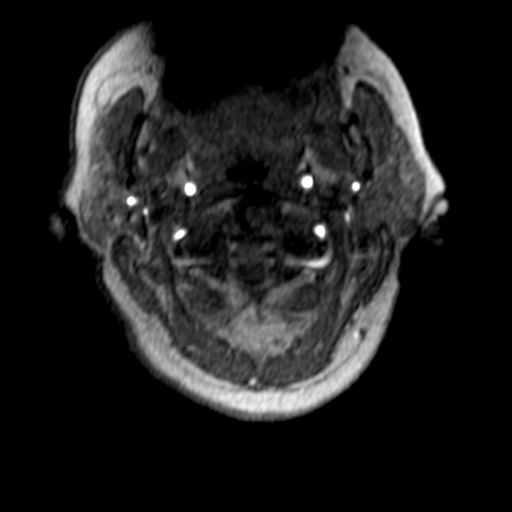
[im 105/110]
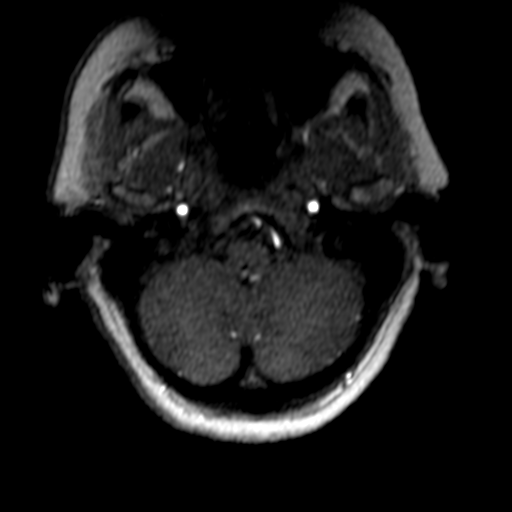

[Series 100: neck l-r · axial · 0.46mm/px · 1 of 1 slices shown]
[im 1/1]
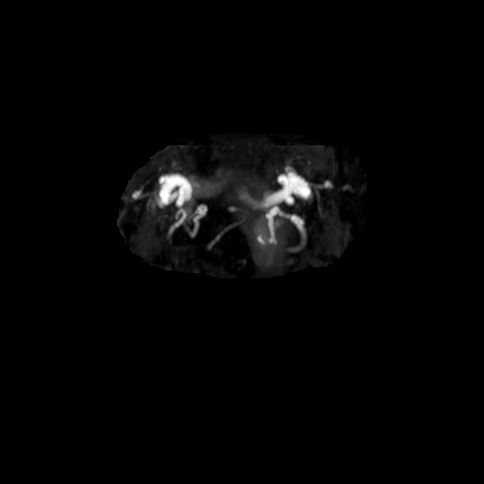

[18 of 48 positions shown; findings below may reference images not displayed]

FINDINGS: MRI HEAD FINDINGS

Brain: Multiple areas of recent infarction bilaterally.
Acute/subacute infarct in the left frontal lobe over the convexity
involving cortex and white matter. Cortical enhancement over the
left frontal convexity without restricted diffusion suggesting late
subacute infarct.

Small area of acute or subacute infarct right medial parietal lobe.
Subacute infarct with mild enhancement in the left parietal lobe.
Cluster of small acute infarcts in the right posterior cerebellum
with mild enhancement.

Moderate chronic microvascular ischemic change in the white matter
and pons. Mild atrophy without hydrocephalus. No intracranial
hemorrhage or mass.

Vascular: Normal arterial flow voids.

Skull and upper cervical spine: No focal skeletal lesion.

Sinuses/Orbits: Paranasal sinuses clear.  Normal orbit

Other: None

MRA HEAD FINDINGS

Both vertebral arteries patent to the basilar. Left PICA patent.
Right PICA not visualized but there is a right AICA which may
contribute to this territory. Basilar widely patent. Fetal origin
right posterior cerebral artery. Mild stenosis right P2 segment.
Left posterior cerebral artery widely patent.

Internal carotid artery patent bilaterally without stenosis.
Hypoplastic right A1 segment which is small. Left A1 widely patent.
Occlusion left A2 segment. Mild stenosis right A2 segment.

Middle cerebral arteries patent bilaterally without significant
stenosis.

MRA NECK FINDINGS

Antegrade flow in carotid and vertebral arteries bilaterally.

Carotid bifurcation patent bilaterally without stenosis. Both
vertebral arteries are patent without stenosis.
IMPRESSION: 1. Multiple areas of acute and subacute infarct in the brain
bilaterally suggestive of emboli.
2. Moderate chronic microvascular ischemic change in the white
matter and pons.
3. Occlusion left A2 segment and mild stenosis right A2 segment.
Hypoplastic right A1 segment.
4. Mild stenosis right P2 segment. Middle cerebral arteries patent
bilaterally.

## 2020-04-03 IMAGING — MR MR HEAD WO/W CM
12 of 13 series · 46 of 48 positions shown · IV contrast (gadavist)
Comparison: None.

CLINICAL DATA: TIA. Right-sided numbness and weakness. History of
aneurysm coiling.

EXAM:
MRI HEAD WITHOUT AND WITH CONTRAST
MRA HEAD WITHOUT   CONTRAST
MRA NECK WITHOUT   CONTRAST
TECHNIQUE: Multiplanar, multiecho pulse sequences of the brain and surrounding
structures were obtained without and with intravenous contrast.
Angiographic images of the head were obtained using MRA technique
without contrast. Angiographic sequences of the neck and surrounding
structures were obtained without intravenous contrast.
CONTRAST:  7.3mL GADAVIST GADOBUTROL 1 MMOL/ML IV SOLN

[Series 2: T1 · sagittal · 5.0mm · 0.90mm/px · 2 of 27 slices shown (1 of 2)]
[im 1/27]
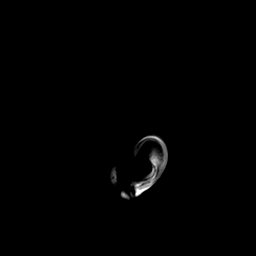
[im 27/27]
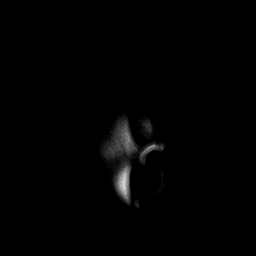

[Series 3: DWI · axial · 3.0mm · 1.88mm/px · z∈[-34,+99]mm · 7 of 84 slices shown (1 of 4)]
[im 1/84]
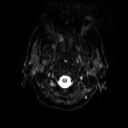
[im 14/84]
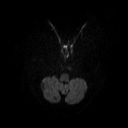
[im 28/84]
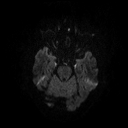
[im 42/84]
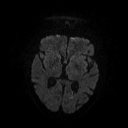
[im 56/84]
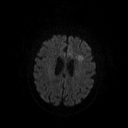
[im 70/84]
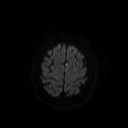
[im 84/84]
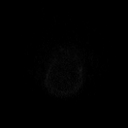

[Series 4: DWI · axial · 3.0mm · 1.88mm/px · z∈[-34,+99]mm · 3 of 41 slices shown (2 of 4)]
[im 1/41]
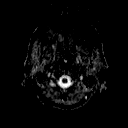
[im 21/41]
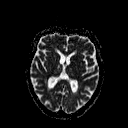
[im 41/41]
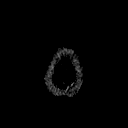

[Series 5: DWI · coronal · 3.0mm · 1.88mm/px · 7 of 93 slices shown (3 of 4)]
[im 1/93]
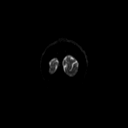
[im 16/93]
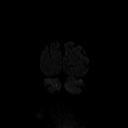
[im 31/93]
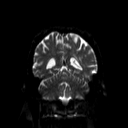
[im 47/93]
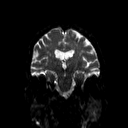
[im 62/93]
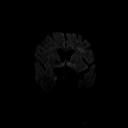
[im 77/93]
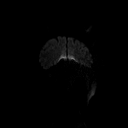
[im 93/93]
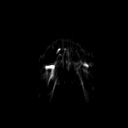

[Series 6: DWI · coronal · 3.0mm · 1.88mm/px · 4 of 48 slices shown (4 of 4)]
[im 1/48]
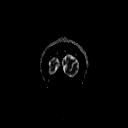
[im 16/48]
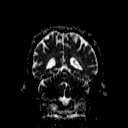
[im 32/48]
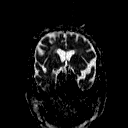
[im 48/48]
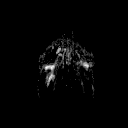

[Series 7: T2 · axial · 5.0mm · 0.66mm/px · z∈[-41,+95]mm · 2 of 24 slices shown (1 of 3)]
[im 1/24]
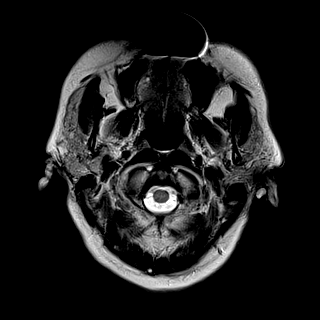
[im 24/24]
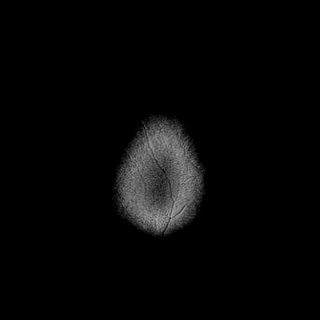

[Series 8: T2 · axial · 5.0mm · 0.41mm/px · z∈[-41,+95]mm · 2 of 24 slices shown (2 of 3)]
[im 1/24]
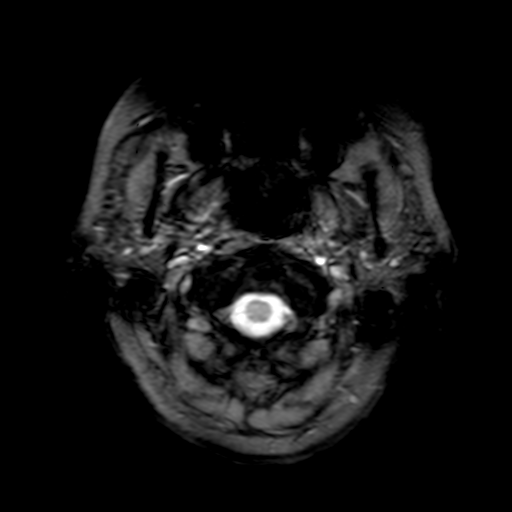
[im 24/24]
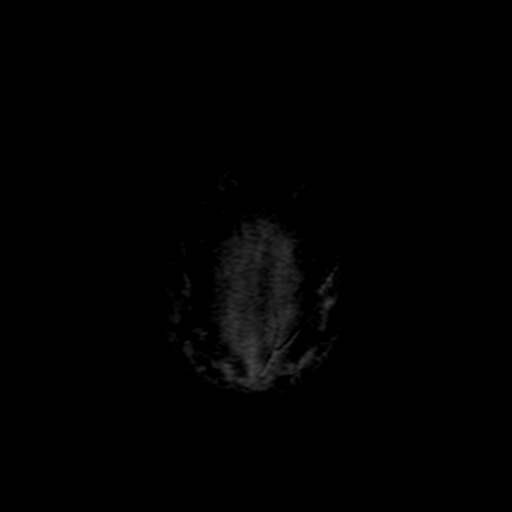

[Series 9: FLAIR · axial · 3.0mm · 0.41mm/px · z∈[-41,+96]mm · 3 of 36 slices shown]
[im 1/36]
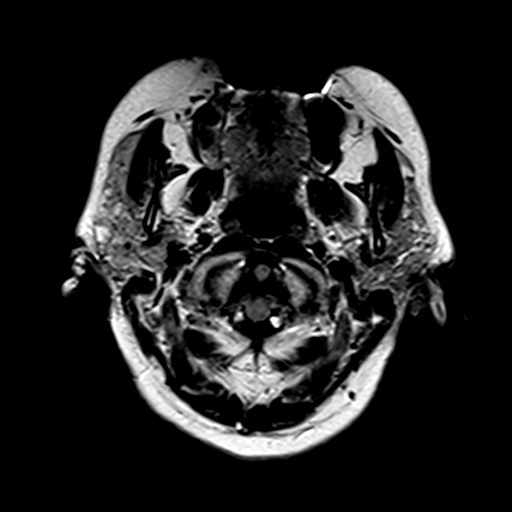
[im 18/36]
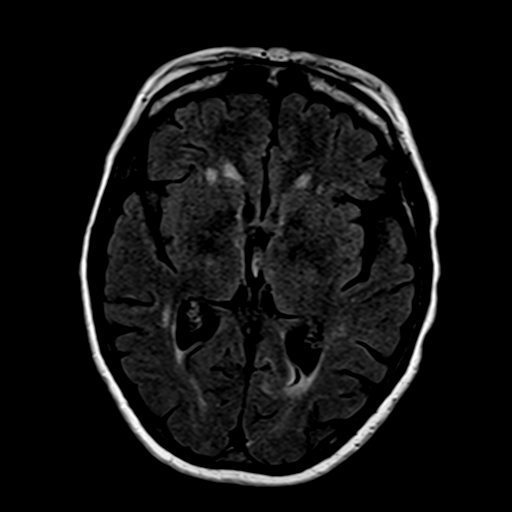
[im 36/36]
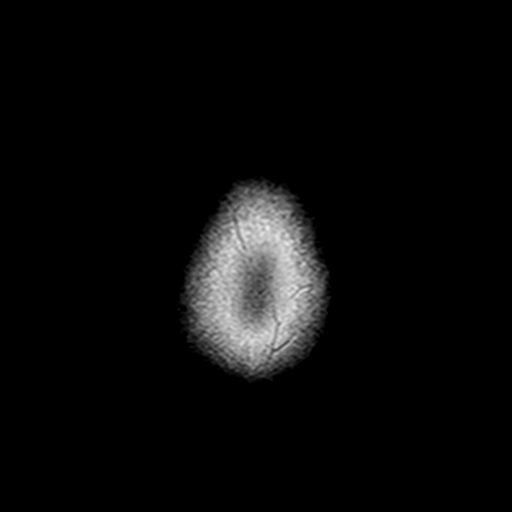

[Series 10: t1_3d_tra · axial · 2.0mm · 0.86mm/px · z∈[-45,+111]mm · 6 of 80 slices shown]
[im 1/80]
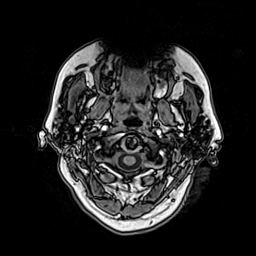
[im 16/80]
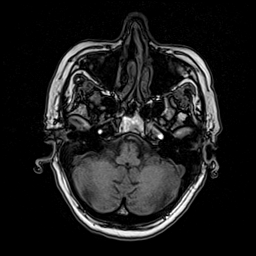
[im 32/80]
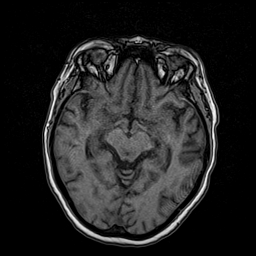
[im 48/80]
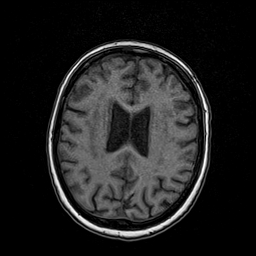
[im 64/80]
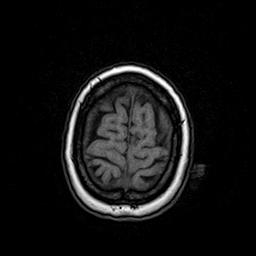
[im 80/80]
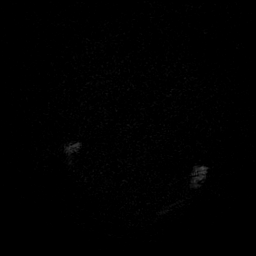

[Series 11: T2 · coronal · 5.0mm · 0.66mm/px · 2 of 28 slices shown (3 of 3)]
[im 1/28]
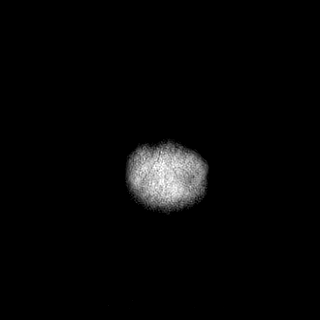
[im 28/28]
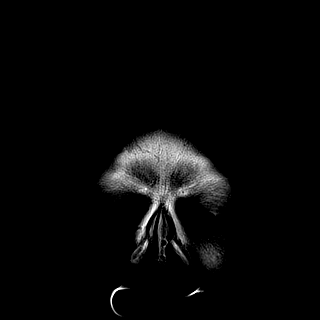

[Series 12: t1_3d_tra +c · axial · 2.0mm · 0.86mm/px · z∈[-45,+111]mm · 6 of 80 slices shown]
[im 1/80]
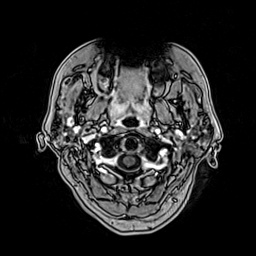
[im 16/80]
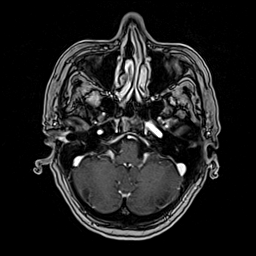
[im 32/80]
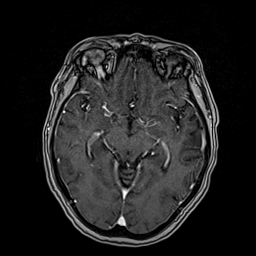
[im 48/80]
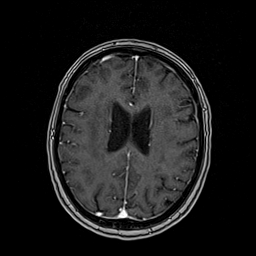
[im 64/80]
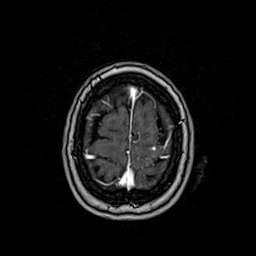
[im 80/80]
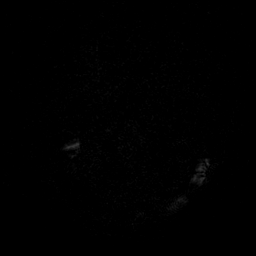

[Series 13: T1 · coronal · 5.0mm · 0.82mm/px · 2 of 28 slices shown (2 of 2)]
[im 1/28]
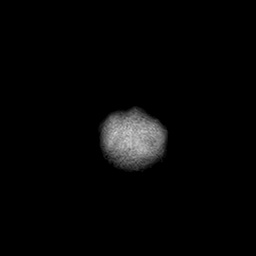
[im 28/28]
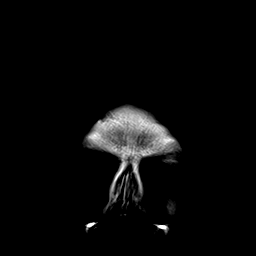

[46 of 48 positions shown; findings below may reference images not displayed]

FINDINGS: MRI HEAD FINDINGS

Brain: Multiple areas of recent infarction bilaterally.
Acute/subacute infarct in the left frontal lobe over the convexity
involving cortex and white matter. Cortical enhancement over the
left frontal convexity without restricted diffusion suggesting late
subacute infarct.

Small area of acute or subacute infarct right medial parietal lobe.
Subacute infarct with mild enhancement in the left parietal lobe.
Cluster of small acute infarcts in the right posterior cerebellum
with mild enhancement.

Moderate chronic microvascular ischemic change in the white matter
and pons. Mild atrophy without hydrocephalus. No intracranial
hemorrhage or mass.

Vascular: Normal arterial flow voids.

Skull and upper cervical spine: No focal skeletal lesion.

Sinuses/Orbits: Paranasal sinuses clear.  Normal orbit

Other: None

MRA HEAD FINDINGS

Both vertebral arteries patent to the basilar. Left PICA patent.
Right PICA not visualized but there is a right AICA which may
contribute to this territory. Basilar widely patent. Fetal origin
right posterior cerebral artery. Mild stenosis right P2 segment.
Left posterior cerebral artery widely patent.

Internal carotid artery patent bilaterally without stenosis.
Hypoplastic right A1 segment which is small. Left A1 widely patent.
Occlusion left A2 segment. Mild stenosis right A2 segment.

Middle cerebral arteries patent bilaterally without significant
stenosis.

MRA NECK FINDINGS

Antegrade flow in carotid and vertebral arteries bilaterally.

Carotid bifurcation patent bilaterally without stenosis. Both
vertebral arteries are patent without stenosis.
IMPRESSION: 1. Multiple areas of acute and subacute infarct in the brain
bilaterally suggestive of emboli.
2. Moderate chronic microvascular ischemic change in the white
matter and pons.
3. Occlusion left A2 segment and mild stenosis right A2 segment.
Hypoplastic right A1 segment.
4. Mild stenosis right P2 segment. Middle cerebral arteries patent
bilaterally.

## 2020-04-03 IMAGING — MR MR MRA HEAD W/O CM
1 series · 44 of 48 positions shown · IV contrast (gadavist)
Comparison: None.

CLINICAL DATA: TIA. Right-sided numbness and weakness. History of
aneurysm coiling.

EXAM:
MRI HEAD WITHOUT AND WITH CONTRAST
MRA HEAD WITHOUT   CONTRAST
MRA NECK WITHOUT   CONTRAST
TECHNIQUE: Multiplanar, multiecho pulse sequences of the brain and surrounding
structures were obtained without and with intravenous contrast.
Angiographic images of the head were obtained using MRA technique
without contrast. Angiographic sequences of the neck and surrounding
structures were obtained without intravenous contrast.
CONTRAST:  7.3mL GADAVIST GADOBUTROL 1 MMOL/ML IV SOLN

[Series 3: tof_3d_multi-slab · axial · 0.7mm · 0.56mm/px · z∈[-61,+40]mm · 44 of 152 slices shown]
[im 1/152]
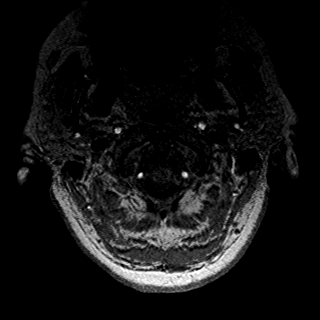
[im 4/152]
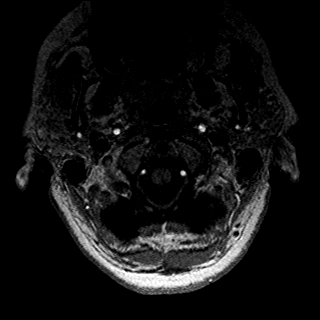
[im 7/152]
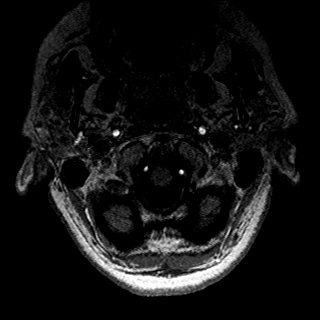
[im 10/152]
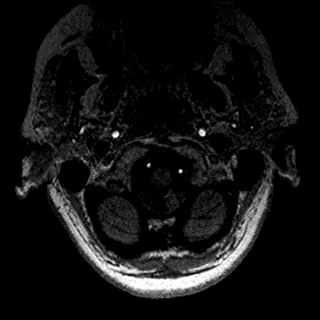
[im 13/152]
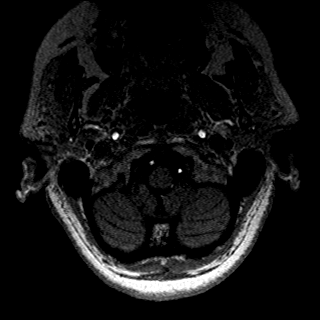
[im 17/152]
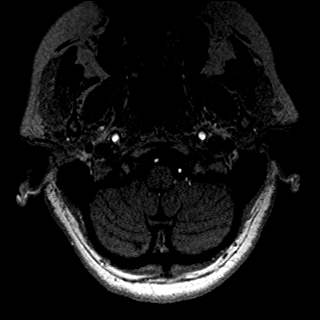
[im 20/152]
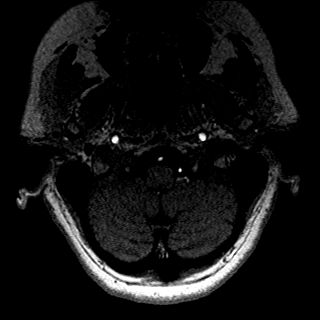
[im 23/152]
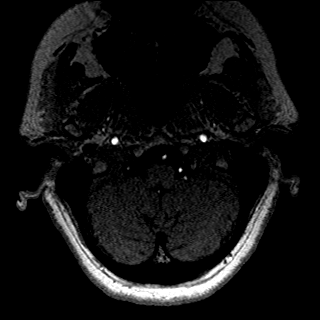
[im 26/152]
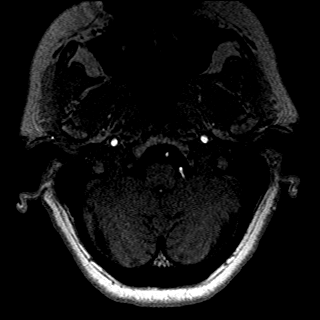
[im 29/152]
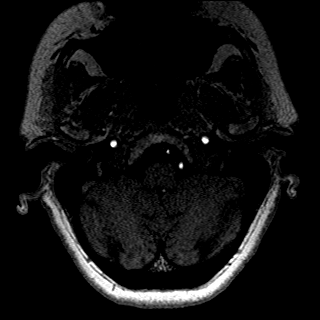
[im 33/152]
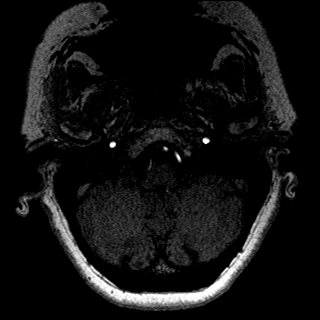
[im 36/152]
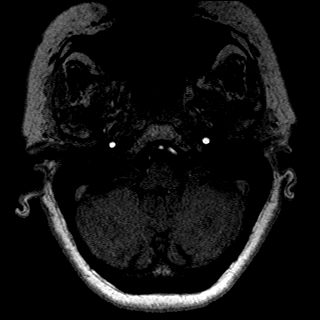
[im 39/152]
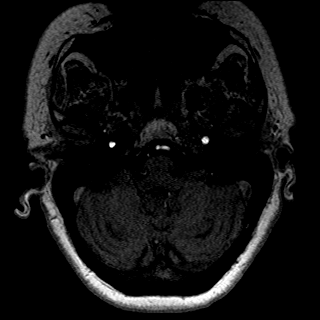
[im 42/152]
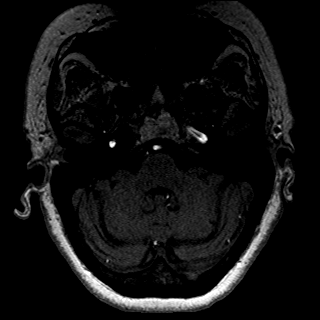
[im 45/152]
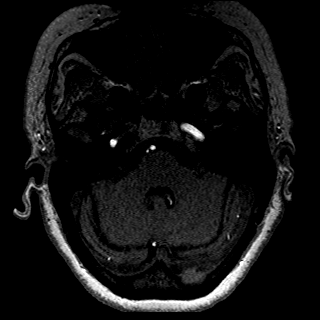
[im 49/152]
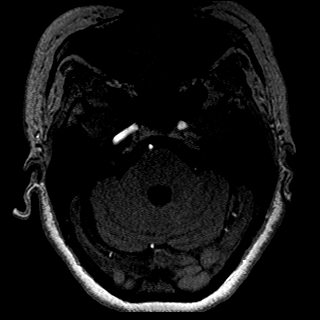
[im 52/152]
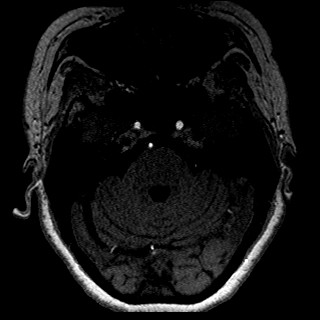
[im 55/152]
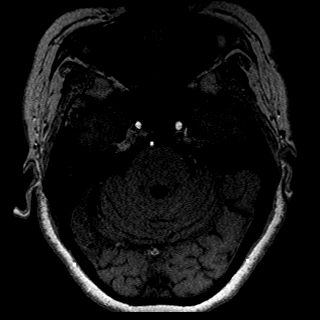
[im 58/152]
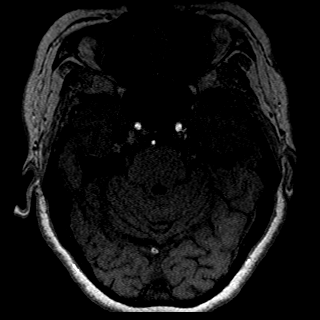
[im 62/152]
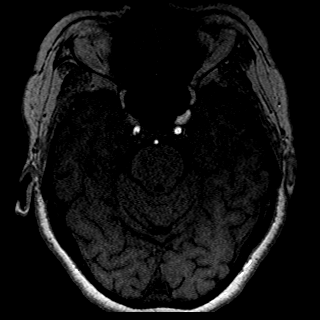
[im 65/152]
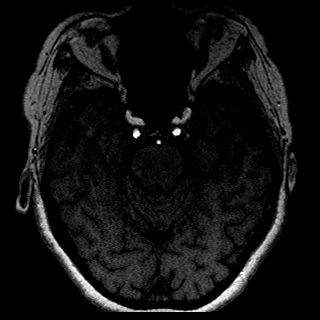
[im 68/152]
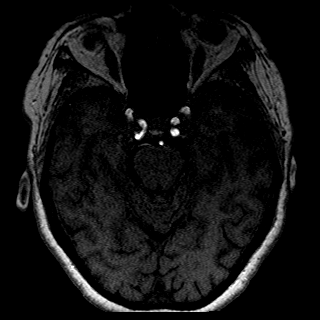
[im 71/152]
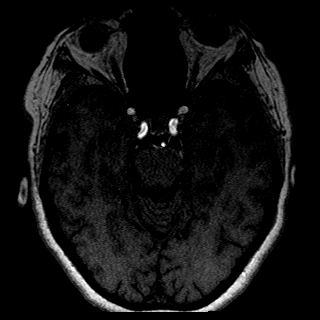
[im 74/152]
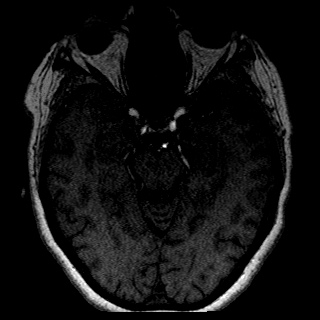
[im 78/152]
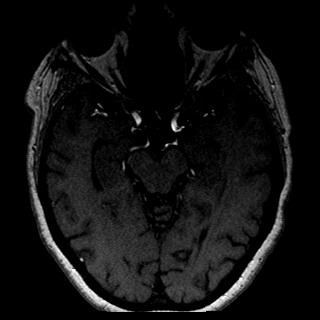
[im 81/152]
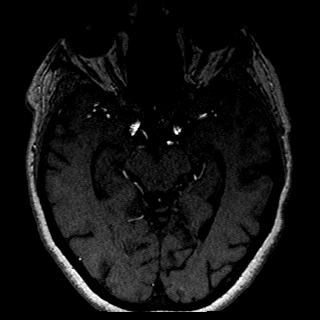
[im 84/152]
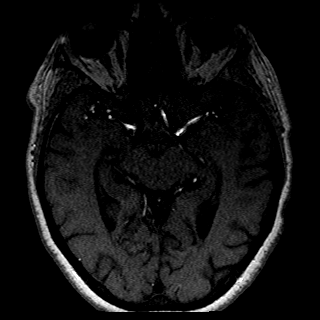
[im 87/152]
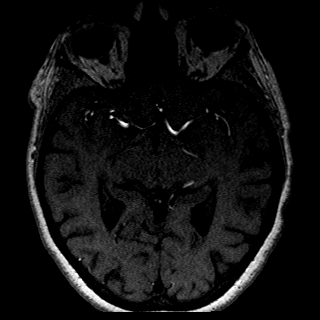
[im 90/152]
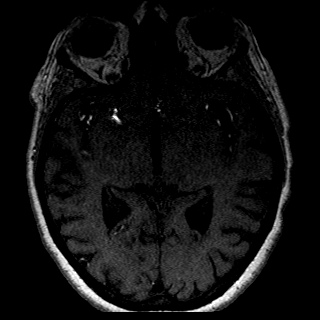
[im 94/152]
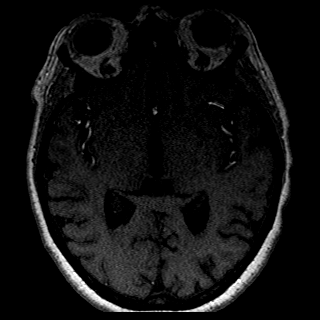
[im 97/152]
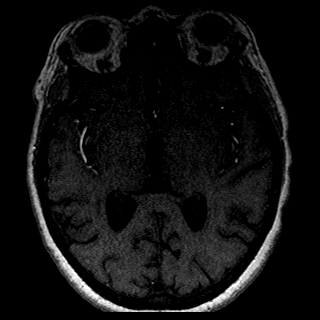
[im 100/152]
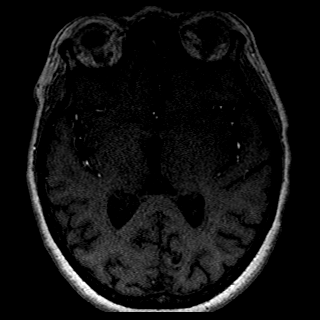
[im 103/152]
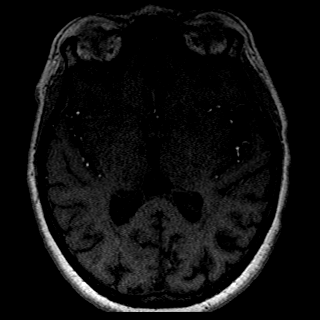
[im 107/152]
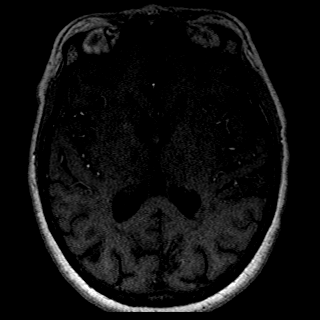
[im 110/152]
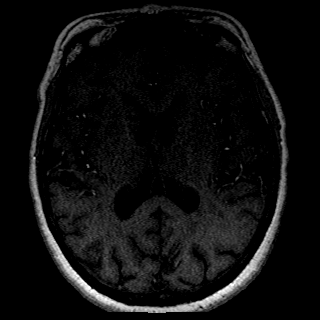
[im 113/152]
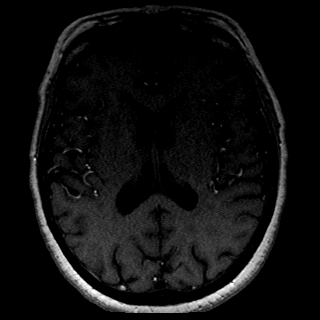
[im 116/152]
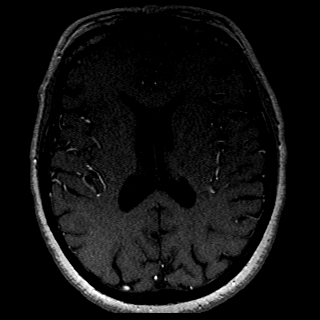
[im 119/152]
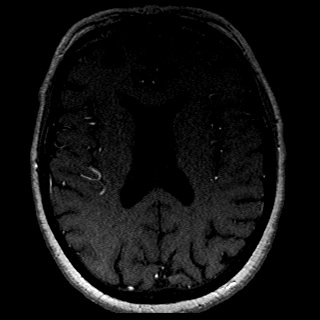
[im 123/152]
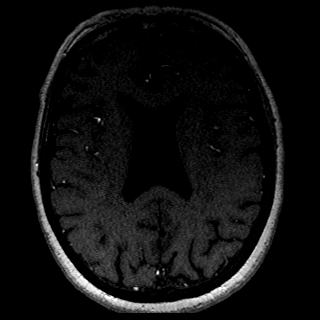
[im 126/152]
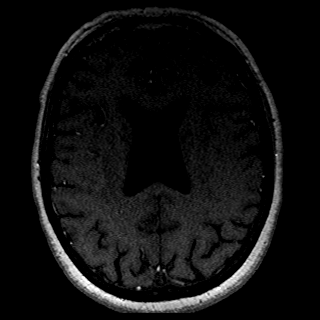
[im 129/152]
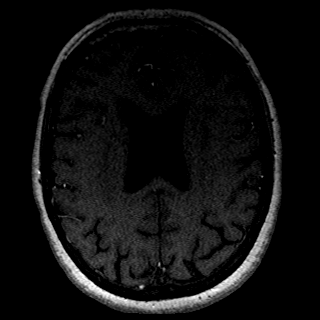
[im 132/152]
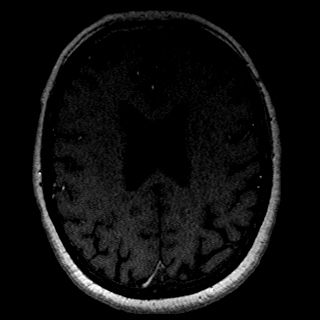
[im 135/152]
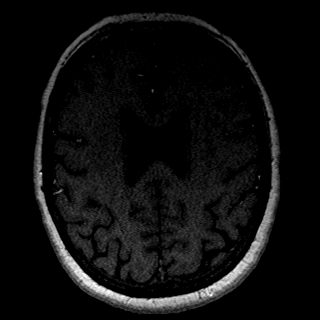
[im 145/152]
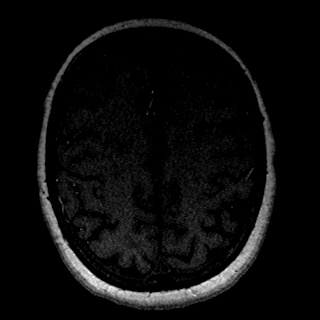

[44 of 48 positions shown; findings below may reference images not displayed]

FINDINGS: MRI HEAD FINDINGS

Brain: Multiple areas of recent infarction bilaterally.
Acute/subacute infarct in the left frontal lobe over the convexity
involving cortex and white matter. Cortical enhancement over the
left frontal convexity without restricted diffusion suggesting late
subacute infarct.

Small area of acute or subacute infarct right medial parietal lobe.
Subacute infarct with mild enhancement in the left parietal lobe.
Cluster of small acute infarcts in the right posterior cerebellum
with mild enhancement.

Moderate chronic microvascular ischemic change in the white matter
and pons. Mild atrophy without hydrocephalus. No intracranial
hemorrhage or mass.

Vascular: Normal arterial flow voids.

Skull and upper cervical spine: No focal skeletal lesion.

Sinuses/Orbits: Paranasal sinuses clear.  Normal orbit

Other: None

MRA HEAD FINDINGS

Both vertebral arteries patent to the basilar. Left PICA patent.
Right PICA not visualized but there is a right AICA which may
contribute to this territory. Basilar widely patent. Fetal origin
right posterior cerebral artery. Mild stenosis right P2 segment.
Left posterior cerebral artery widely patent.

Internal carotid artery patent bilaterally without stenosis.
Hypoplastic right A1 segment which is small. Left A1 widely patent.
Occlusion left A2 segment. Mild stenosis right A2 segment.

Middle cerebral arteries patent bilaterally without significant
stenosis.

MRA NECK FINDINGS

Antegrade flow in carotid and vertebral arteries bilaterally.

Carotid bifurcation patent bilaterally without stenosis. Both
vertebral arteries are patent without stenosis.
IMPRESSION: 1. Multiple areas of acute and subacute infarct in the brain
bilaterally suggestive of emboli.
2. Moderate chronic microvascular ischemic change in the white
matter and pons.
3. Occlusion left A2 segment and mild stenosis right A2 segment.
Hypoplastic right A1 segment.
4. Mild stenosis right P2 segment. Middle cerebral arteries patent
bilaterally.

## 2020-04-03 MED ORDER — ASPIRIN EC 81 MG PO TBEC
81.0000 mg | DELAYED_RELEASE_TABLET | Freq: Every day | ORAL | Status: DC
  Administered 2020-04-03 – 2020-04-07 (×5): 81 mg via ORAL
  Filled 2020-04-03 (×5): qty 1

## 2020-04-03 MED ORDER — CLOPIDOGREL BISULFATE 75 MG PO TABS
75.0000 mg | ORAL_TABLET | Freq: Every day | ORAL | Status: DC
  Administered 2020-04-04: 75 mg via ORAL
  Filled 2020-04-03: qty 1

## 2020-04-03 MED ORDER — GADOBUTROL 1 MMOL/ML IV SOLN
7.3000 mL | Freq: Once | INTRAVENOUS | Status: AC | PRN
  Administered 2020-04-03: 7.3 mL via INTRAVENOUS

## 2020-04-03 MED ORDER — ACETAMINOPHEN 325 MG PO TABS
650.0000 mg | ORAL_TABLET | ORAL | Status: DC | PRN
  Filled 2020-04-03: qty 2

## 2020-04-03 MED ORDER — CLOPIDOGREL BISULFATE 75 MG PO TABS
300.0000 mg | ORAL_TABLET | Freq: Once | ORAL | Status: AC
  Administered 2020-04-03: 300 mg via ORAL
  Filled 2020-04-03: qty 4

## 2020-04-03 MED ORDER — AMLODIPINE BESYLATE 5 MG PO TABS: 5.0000 mg | ORAL_TABLET | Freq: Every day | ORAL | Status: DC

## 2020-04-03 MED ORDER — ACETAMINOPHEN 650 MG RE SUPP: 650.0000 mg | RECTAL | Status: DC | PRN

## 2020-04-03 MED ORDER — SENNOSIDES-DOCUSATE SODIUM 8.6-50 MG PO TABS: 1.0000 | ORAL_TABLET | Freq: Every evening | ORAL | Status: DC | PRN

## 2020-04-03 MED ORDER — ACETAMINOPHEN 160 MG/5ML PO SOLN: 650.0000 mg | ORAL | Status: DC | PRN

## 2020-04-03 MED ORDER — STROKE: EARLY STAGES OF RECOVERY BOOK
Freq: Once | Status: AC
  Administered 2020-04-03: 1

## 2020-04-03 NOTE — ED Provider Notes (Signed)
[facility] EMERGENCY DEPARTMENT Provider Note   CSN: [id number] Arrival date & time: 04/03/20  5364     History Chief Complaint  Patient presents with  . Numbness    Jessica [name] is a 59 y.o. female.  Patient states that she woke up this morning around 4 AM with right-sided heaviness and numbness.  She went to bed at 10 PM.  Has a history of stroke but no chronic deficits from that.  She states that this feeling lasted several minutes and then resolved.  She felt unsteady when she was walking but denies any vision or speech changes.  She was convinced by her coworkers to come for evaluation for stroke.  She has no headache, no abdominal pain, no chest pain.  The history is provided by the patient.  Neurologic Problem This is a new problem. The current episode started 6 to 12 hours ago. The problem has been resolved. Pertinent negatives include no chest pain, no abdominal pain, no headaches and no shortness of breath. Nothing aggravates the symptoms. Nothing relieves the symptoms. She has tried nothing for the symptoms. The treatment provided no relief.       Past Medical History:  Diagnosis Date  . High cholesterol   . Stroke [facility])     There are no problems to display for this patient.   Past Surgical History:  Procedure Laterality Date  . ABDOMINAL HYSTERECTOMY       OB History   No obstetric history on file.     No family history on file.  Social History   Tobacco Use  . Smoking status: Current Every Day Smoker    Types: Cigarettes  . Smokeless tobacco: Never Used  Substance Use Topics  . Alcohol use: No  . Drug use: No    Home Medications Prior to Admission medications   Medication Sig Start Date End Date Taking? Authorizing Provider  oxyCODONE-acetaminophen (PERCOCET/ROXICET) 5-325 MG per tablet Take 1-2 tablets by mouth every 6 (six) hours as needed for pain. 07/08/13   Mabe, Latanya Maudlin, MD  rosuvastatin (CRESTOR) 20 MG tablet Take 20 mg by  mouth daily.    [provider]    Allergies    Patient has no known allergies.  Review of Systems   Review of Systems  Constitutional: Negative for chills and fever.  HENT: Negative for ear pain and sore throat.   Eyes: Negative for pain and visual disturbance.  Respiratory: Negative for cough and shortness of breath.   Cardiovascular: Negative for chest pain and palpitations.  Gastrointestinal: Negative for abdominal pain and vomiting.  Genitourinary: Negative for dysuria and hematuria.  Musculoskeletal: Negative for arthralgias and back pain.  Skin: Negative for color change and rash.  Neurological: Positive for weakness and numbness. Negative for dizziness, tremors, seizures, syncope, facial asymmetry, speech difficulty, light-headedness and headaches.  All other systems reviewed and are negative.   Physical Exam Updated Vital Signs BP (!) 165/79   Pulse 77   Temp 98.2 F (36.8 C) (Oral)   Resp 13   Ht 5\' 5"  (1.651 m)   Wt 73.8 kg   SpO2 92%   BMI 27.09 kg/m   Physical Exam Vitals and nursing note reviewed.  Constitutional:      General: She is not in acute distress.    Appearance: She is well-developed. She is not ill-appearing.  HENT:     Head: Normocephalic and atraumatic.     Nose: Nose normal.     Mouth/Throat:  Mouth: Mucous membranes are moist.  Eyes:     Extraocular Movements: Extraocular movements intact.     Conjunctiva/sclera: Conjunctivae normal.     Pupils: Pupils are equal, round, and reactive to light.  Cardiovascular:     Rate and Rhythm: Normal rate and regular rhythm.     Pulses: Normal pulses.     Heart sounds: Normal heart sounds. No murmur heard.   Pulmonary:     Effort: Pulmonary effort is normal. No respiratory distress.     Breath sounds: Normal breath sounds.  Abdominal:     Palpations: Abdomen is soft.     Tenderness: There is no abdominal tenderness.  Musculoskeletal:        General: Normal range of motion.      Cervical back: Normal range of motion and neck supple.  Skin:    General: Skin is warm and dry.     Capillary Refill: Capillary refill takes less than 2 seconds.  Neurological:     General: No focal deficit present.     Mental Status: She is alert and oriented to person, place, and time.     Cranial Nerves: No cranial nerve deficit.     Sensory: No sensory deficit.     Motor: No weakness.     Coordination: Coordination normal.     Comments: 5+ out of 5 strength throughout, normal sensation, no drift, normal finger-to-nose finger, no visual field deficit, normal speech  Psychiatric:        Mood and Affect: Mood normal.     ED Results / Procedures / Treatments   Labs (all labs ordered are listed, but only abnormal results are displayed) Labs Reviewed  COMPREHENSIVE METABOLIC PANEL - Abnormal; Notable for the following components:      Result Value   Glucose, Bld 101 (*)    All other components within normal limits  ETHANOL  PROTIME-INR  APTT  CBC  DIFFERENTIAL  URINALYSIS, ROUTINE W REFLEX MICROSCOPIC  CBG MONITORING, ED    EKG EKG Interpretation  Date/Time:  Saturday April 03 2020 09:46:30 EDT Ventricular Rate:  73 PR Interval:    QRS Duration: 104 QT Interval:  441 QTC Calculation: 486 R Axis:   -79 Text Interpretation: Sinus rhythm LAD, consider left anterior fascicular block Nonspecific T abnormalities, inferior leads Borderline prolonged QT interval Confirmed by Lennice Sites [phone]) on 04/03/2020 9:56:45 AM   Radiology CT Head Wo Contrast  Result Date: 04/03/2020 CLINICAL DATA:  Right-sided weakness/numbness which has resolved. Previous history of coil embolization of intracerebral aneurysms. EXAM: CT HEAD WITHOUT CONTRAST TECHNIQUE: Contiguous axial images were obtained from the base of the skull through the vertex without intravenous contrast. COMPARISON:  01/20/2017 FINDINGS: Brain: Ventricles, cisterns and other CSF spaces are within normal. There is evidence  of chronic ischemic microvascular disease. There is a small old left occipital infarct. Possible old lacunar infarct over the left cerebellum. Moderate streak artifact from patient's previous aneurysm coiling involving bilateral posterior communicating arteries as well as region of the anterior communicating artery. No evidence of mass, mass effect or acute hemorrhage. No evidence of acute infarction. Vascular: Embolization coils as described. Skull: Normal. Negative for fracture or focal lesion. Sinuses/Orbits: No acute finding. Other: None. IMPRESSION: 1.  No acute findings. 2. 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 3. Chronic ischemic microvascular disease. Possible old left cerebellar lacunar infarct as well as small old left occipital infarct. Electronically Signed   By: Marin Olp M.D.  On: 04/03/2020 10:54    Procedures Procedures (including critical care time)  Medications Ordered in ED Medications - No data to display  ED Course  I have reviewed the triage vital signs and the nursing notes.  Pertinent labs & imaging results that were available during my care of the patient were reviewed by me and considered in my medical decision making (see chart for details).    MDM Rules/Calculators/A&P                          Jessica [name] is a 59 year old female with history of cerebral aneurysms status post coiling, high cholesterol who presents to the ED with right sided numbness and weakness.  Patient with normal vitals except for mild hypertension.  No fever.  Patient states that she went to bed last night at 10 PM and woke up around 4 AM with right-sided numbness and weakness.  Overall it is difficult for her to exactly say how that felt but she felt unsteady when she was trying to walk.  She felt like the right side of her body was asleep and felt numb and heavy.  Symptoms lasted for several minutes.  No vision or speech problems during that  time.  No headaches.  Patient ended up going to work and coworkers convinced her to come for evaluation.  Overall she appears to have normal neurological exam.  No headache.  Patient on aspirin but no other blood thinners.  Upon chart review it appears that she had ICH several years ago and had coiling to multiple arteries for aneurysms.  She has been stable since then.  Recent MRA showed occluded aneurysm status post coiling.  She follows with neurology.  Will get CT head and basic labs and have telemetry neurology evaluate the patient as possible TIA.  CT scan unremarkable.  No significant anemia, lateral abnormality, kidney injury.  Telemetry neurology recommends MRI with and without contrast and EEG and admission for TIA versus seizure.  Contacted Dr. Laurence Slate with neurology at [facility] to make them aware of this.  He recommends add an MRA of the head and neck as well.  To be admitted to hospitalist for further work-up.  We will obtain MRI imagings here at [facility] as have capacity to do this at this time while patient is awaiting for bed.   Patient transferred in stable condition.  MRIs have been done but reports have not been read.  This chart was dictated using voice recognition software.  Despite best efforts to proofread,  errors can occur which can change the documentation meaning.    Final Clinical Impression(s) / ED Diagnoses Final diagnoses:  TIA (transient ischemic attack)    Rx / DC Orders ED Discharge Orders    None       Virgina Norfolk, DO 04/03/20 1440

## 2020-04-03 NOTE — Consult Note (Signed)
[facility] TeleNeurology Consult Services  Stat Consult  Date of Service:   04/03/2020 11:39:23  Impression:     .  G45.9 - Transient cerebral ischemic attack, unspecified     .  R56.9 - Seizures  Comments/Sign-Out: Pt is a 59 YOF with PMH of HLD, ICH secondary to [place] who presented with complaint of right sided weakness/numbness/tingling.  NIHSS: 0. Deferred TPA as no outside window. Deferred CTA for now. Admit for TIA vs SEIZURE workup.   CT HEAD: Showed No Acute Hemorrhage or Acute Core Infarct.  Metrics: [facility] Notification Time: 04/03/2020 11:37:10 Stamp Time: 04/03/2020 11:39:23 Callback Response Time: 04/03/2020 11:40:01  Our recommendations are outlined below.  Recommendations:     Marland Kitchen  Goal SBP b/w 100-140.     Marland Kitchen  Start ASA + STATIN if no contraindications.     .  Get MRI BRAIN W/ and W/O, CAROTID US, and ECHO.     Marland Kitchen  Get EEG.     .  Start LEV 500 mg bid after MRI/EEG completed.     .  Get ESR/CRP, TROP, TSH, B12, CK, LACTIC ACID, LIPID PANEL, and A1C.     .  Get WORKUP for TOXIC/METABOLIC/INFECTIOUS causes.  Imaging Studies:     .  MRI Head with and Without Contrast     .  Carotid Dopplers     .  Echocardiogram - Transthoracic Echocardiogram  Therapies:     .  Physical Therapy, Occupational Therapy, Speech Therapy Assessment When Applicable  Other WorkUp:     .  Infectious/metabolic workup per primary team     .  Check an ammonia level     .  Check B12 level     .  Check TSH     .  Check Urinalysis  Disposition: Neurology Follow Up Recommended  Sign Out:     .  Discussed with Emergency Department Provider  ----------------------------------------------------------------------------------------------------  Chief Complaint: right sided weakness/numbness/tingling  History of Present Illness: Patient is a 59 year old Female.  Pt is a 59 YOF with PMH of HLD, ICH secondary to [place] who presented  with complaint of right sided weakness/numbness/tingling. She woke up at 0400 (went to bed around 2200 last night) with right sided symptoms. She went to work and wasn't feeling right (unsteady on feet). Her co-workers convinced her to go to ED. She denied any headache, slurred speech, fall or injury, no language issues. She denied any seizure activity today.   Past Medical History:     . Hyperlipidemia     . There is NO history of Hypertension     . There is NO history of Diabetes Mellitus     . There is NO history of Atrial Fibrillation     . There is NO history of Coronary Artery Disease     . There is NO history of Stroke  Anticoagulant use:  No  Antiplatelet use: ASA    Examination: BP(162/82), Pulse(75), Blood Glucose(101) 1A: Level of Consciousness - Alert; keenly responsive + 0 1B: Ask Month and Age - Both Questions Right + 0 1C: Blink Eyes & Squeeze Hands - Performs Both Tasks + 0 2: Test Horizontal Extraocular Movements - Normal + 0 3: Test Visual Fields - No Visual Loss + 0 4: Test Facial Palsy (Use Grimace if Obtunded) - Normal symmetry + 0 5A: Test Left Arm Motor Drift - No Drift for 10 Seconds + 0 5B: Test Right Arm Motor Drift - No Drift  for 10 Seconds + 0 6A: Test Left Leg Motor Drift - No Drift for 5 Seconds + 0 6B: Test Right Leg Motor Drift - No Drift for 5 Seconds + 0 7: Test Limb Ataxia (FNF/Heel-Shin) - No Ataxia + 0 8: Test Sensation - Normal; No sensory loss + 0 9: Test Language/Aphasia - Normal; No aphasia + 0 10: Test Dysarthria - Normal + 0 11: Test Extinction/Inattention - No abnormality + 0  NIHSS Score: 0   Patient/Family was informed the Neurology Consult would occur via TeleHealth consult by way of interactive audio and video telecommunications and consented to receiving care in this manner.  Patient is being evaluated for possible acute neurologic impairment and high probability of imminent or life-threatening deterioration. I spent total of 35  minutes providing care to this patient, including time for face to face visit via telemedicine, review of medical records, imaging studies and discussion of findings with providers, the patient and/or family.   Dr Currie Paris   [facility] [phone]  Case [id number]

## 2020-04-03 NOTE — Plan of Care (Signed)
Received signout from Dr. Lockie Mola at [facility]. Ms. Jessica [name] is a 59 y/o F with pmh HTN and SAH s/p coiling who presented with complaints of right-sided weakness lasting several minutes prior to self resolving.  Initial CT scan of the brain negative for any acute abnormalities and labs within normal limits.  Telemetry neurology was consulted and question TIA versus seizure.  Neurology here at [facility] was consulted and recommended MRI of the head neck which is ordered.  Please call upon arrival to [facility] and they will evaluate for need of EEG.  Accepted to a medical telemetry bed as observation.

## 2020-04-03 NOTE — Consult Note (Signed)
Neurology Consultation Reason for Consult: Stroke Referring Physician: Maurine Minister  CC: Stroke  History is obtained from:patient  HPI: Jessica [name] is a 59 y.o. female with a history of high cholesterol and previous stroke who presents with right-sided numbness and weakness which mostly resolved.  She states that she noticed that her right leg >right arm was numb and weak, and she also noticed shaking of her right leg.  She states that the severe symptoms including the severe shaking lasted for about 30 minutes before improving.  On my exam, I noticed that she had some tremor in her right arm on finger-nose-finger, and I asked if this is like the shaking she had with her leg and she endorsed that it was a very similar movement.   LKW: 10 PM tpa given?: no, outside of window  ROS: A 14 point ROS was performed and is negative except as noted in the HPI.  Past Medical History:  Diagnosis Date  . High cholesterol   . Stroke [facility])      FHx: No Hx similar   Social History:  reports that she has been smoking cigarettes. She has never used smokeless tobacco. She reports that she does not drink alcohol and does not use drugs.   Exam: Current vital signs: BP (!) 155/105 (BP Location: Right Arm)   Pulse 80   Temp 98 F (36.7 C) (Oral)   Resp (!) 21   Ht 5\' 5"  (1.651 m)   Wt 73.8 kg   SpO2 95%   BMI 27.09 kg/m  Vital signs in last 24 hours: Temp:  [98 F (36.7 C)-98.2 F (36.8 C)] 98 F (36.7 C) (06/26 1946) Pulse Rate:  [73-90] 80 (06/26 1811) Resp:  [13-23] 21 (06/26 1946) BP: (155-173)/(74-105) 155/105 (06/26 1946) SpO2:  [91 %-96 %] 95 % (06/26 1946) Weight:  [73.8 kg] 73.8 kg (06/26 0943)   Physical Exam  Constitutional: Appears well-developed and well-nourished.  Psych: Affect appropriate to situation Eyes: No scleral injection HENT: No OP obstrucion MSK: no joint deformities.  Cardiovascular: Normal rate and regular rhythm.  Respiratory: Effort normal, non-labored  breathing GI: Soft.  No distension. There is no tenderness.  Skin: WDI  Neuro: Mental Status: Patient is awake, alert, oriented to person, place, month, year, and situation. Patient is able to give a clear and coherent history. No signs of aphasia or neglect Cranial Nerves: II: Visual Fields are full. Pupils are equal, round, and reactive to light.   III,IV, VI: EOMI without ptosis or diploplia.  V: Facial sensation is symmetric to temperature VII: Facial movement is  VIII: hearing is intact to voice X: Uvula elevates symmetrically XI: Shoulder shrug is symmetric. XII: tongue is midline without atrophy or fasciculations.  Motor: Tone is normal. Bulk is normal. 5/5 strength was present on the left, on the right she has very mild 4+/5 weakness of the arma nd 4/5 in the leg.  Sensory: Sensation is symmetric to light touch and temperature in the arms and legs. Deep Tendon Reflexes: 2+ and symmetric in the biceps and patellae.  Cerebellar: FNF with tremor without passpointing on the right.    I have reviewed labs in epic and the results pertinent to this consultation are: cmp - unremarkable  I have reviewed the images obtained: MRI brain - multifocal infarcts and A2 occlusion on the left.   Impression: 59 year old female with multifocal infarcts suggestive of embolic disease.  I suspect that the left A2 occlusion is the most recent symptomatic event.  Her symptoms are mild, and the occlusion is relatively distal, making her not a candidate for thrombectomy.  She will need work-up to determine source of her strokes.  Given the MRI and description of her shaking, I do not think an EEG is needed.  Recommendations: - HgbA1c, fasting lipid panel - Frequent neuro checks - Echocardiogram - Prophylactic therapy-Antiplatelet med: ASA 81 mg daily and plavix 75mg  daily following 300mg  load.  - Risk factor modification - Telemetry monitoring - PT consult, OT consult, Speech consult - Stroke  team to follow   Roland Rack, MD [facility] [phone]  If 7pm- 7am, please page neurology on call as listed in [place].\

## 2020-04-03 NOTE — Progress Notes (Signed)
Patient received from [facility] transport to 3w18. Patient is alert and oriented X4; patient denies any numbness; patient denies any headache or dizziness; patient is very weak to get off of the stretcher; had to be assisted; patient very slow moving to the edge of the bed to stand for the bedside commode; high fall risk upon arrival. Moving very slow and stiff-like all over her body. Remains oriented and clear spoken.  Patient oriented to room and unit routine; fall safety reviewed; bed low, locked, and alarm set.

## 2020-04-03 NOTE — H&P (Addendum)
History and Physical    Jessica [name] JJH:[id number] DOB: 1960/11/18 DOA: 04/03/2020  PCP: Patient, No Pcp Per  Patient coming from: home  I have personally briefly reviewed patient's old medical records in [place]  Chief Complaint: R Leg  numbness  HPI: Jessica [name] is a 59 y.o. female with medical history significant of  HLD , cerebral aneurysms status post coiling,presents to ed with complaint of R leg numbness that she noted on awaking at 4am the morning of admission. She describes symptoms as heaviness and numbness of her right leg.She states the prior evening before going to bed at 10 pm she  was in her normal state of health. She states her symptoms lasted 30 minutes then resolved. She however s/p resolution of described symptoms felt that her right side was still  Somewhat weak compared to her baseline. However she denies any falls or difficulty with her balance or ambulation.  She denies any associated, confusion, HA, speech difficulties, difficulties with swallowing, facial asymmetry, but does mention milder R upper extremity symptoms but no any other focal areas of paresthesias or limb weakness. She also denied any vision changes, presyncope, sob, chest pain, fever chills, n/v, back pain , dysuria, or bowel or bladder incontinence. She does however note slowing of her thought process.    ED Course:  Afeb, bp 166/78, hr 73, rr 21 , sat 95% on ra  Neuro exam non-focal no noted deficits   CT scan:  1. No acute findings.  2. 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  3. Chronic ischemic microvascular disease. Possible old left cerebellar lacunar infarct as well as small old left occipital infarct.  MRI/MRA: 1. Multiple areas of acute and subacute infarct in the brain bilaterally suggestive of emboli. 2. Moderate chronic microvascular ischemic change in the white matter and pons. 3. Occlusion left A2 segment and mild  stenosis right A2 segment. Hypoplastic right A1 segment. 4. Mild stenosis right P2 segment. Middle cerebral arteries patent bilaterally.   Labs:  Cr 0.48, , gluc 101K 3.8 etoh <10 Wbc 8.1, hgb 13.3 plt188 PT13.4 inr 1.1  Neurology Dr [place] Pink  Patient started on dual antiplatelet therapy    Review of Systems: As per HPI otherwise 10 point review of systems negative.   Past Medical History:  Diagnosis Date  . High cholesterol   . Stroke [facility])     Past Surgical History:  Procedure Laterality Date  . ABDOMINAL HYSTERECTOMY       reports that she has been smoking cigarettes. She has never used smokeless tobacco. She reports that she does not drink alcohol and does not use drugs.  No Known Allergies  No family history on file.  HTN, DMII  Prior to Admission medications   Medication Sig Start Date End Date Taking? Authorizing Provider  aspirin EC 81 MG tablet Take 81 mg by mouth daily. Swallow whole.   Yes [provider]  atorvastatin (LIPITOR) 80 MG tablet Take 80 mg by mouth at bedtime.  03/08/20  Yes [provider]  oxyCODONE-acetaminophen (PERCOCET/ROXICET) 5-325 MG per tablet Take 1-2 tablets by mouth every 6 (six) hours as needed for pain. Patient not taking: Reported on 04/03/2020 07/08/13   Pixie Casino, MD    Physical Exam: Vitals:   04/03/20 1230 04/03/20 1430 04/03/20 1601 04/03/20 1811  BP: (!) 173/86 (!) 163/92 (!) 160/74 (!) 172/80  Pulse: 87 76 90 80  Resp: (!) 22 (!) 22  18 16  Temp:    98.2 F (36.8 C)  TempSrc:    Oral  SpO2: 96% 91% 94% 95%  Weight:      Height:        Constitutional: NAD, calm, comfortable Vitals:   04/03/20 1230 04/03/20 1430 04/03/20 1601 04/03/20 1811  BP: (!) 173/86 (!) 163/92 (!) 160/74 (!) 172/80  Pulse: 87 76 90 80  Resp: (!) 22 (!) 22 18 16   Temp:    98.2 F (36.8 C)  TempSrc:    Oral  SpO2: 96% 91% 94% 95%  Weight:      Height:       Eyes: PERRL, lids and conjunctivae  normal ENMT: Mucous membranes are moist. Posterior pharynx clear of any exudate or lesions.Normal dentition.  Neck: normal, supple, no masses, no thyromegaly Respiratory: clear to auscultation bilaterally, no wheezing, no crackles. Normal respiratory effort. No accessory muscle use.  Cardiovascular: Regular rate and rhythm, no murmurs / rubs / gallops. No extremity edema. 2+ pedal pulses. No carotid bruits.  Abdomen: no tenderness, no masses palpated. No hepatosplenomegaly. Bowel sounds positive.  Musculoskeletal: no clubbing / cyanosis. No joint deformity upper and lower extremities. Good ROM, no contractures. Normal muscle tone.Skin: no rashes, lesions, ulcers. No induration Neurologic: CN 2-12 grossly intact. Sensation intact, DTR normal. Strength 5/5 in all 4.   Right lower extremity with slight tremor on strength testing Notes slowed response to question. Patient however is noted to have clear speech Psychiatric: Normal judgment and insight. Alert and oriented x 3. Normal mood.    Labs on Admission: I have personally reviewed following labs and imaging studies  CBC: Recent Labs  Lab 04/03/20 1004  WBC 8.1  NEUTROABS 4.8  HGB 13.3  HCT 41.3  MCV 91.0  PLT 188   Basic Metabolic Panel: Recent Labs  Lab 04/03/20 1004  NA 142  K 3.8  CL 107  CO2 24  GLUCOSE 101*  BUN 10  CREATININE 0.48  CALCIUM 8.9   GFR: Estimated Creatinine Clearance: 74.3 mL/min (by C-G formula based on SCr of 0.48 mg/dL). Liver Function Tests: Recent Labs  Lab 04/03/20 1004  AST 18  ALT 22  ALKPHOS 108  BILITOT 0.7  PROT 7.1  ALBUMIN 4.0   No results for input(s): LIPASE, AMYLASE in the last 168 hours. No results for input(s): AMMONIA in the last 168 hours. Coagulation Profile: Recent Labs  Lab 04/03/20 1004  INR 1.1   Cardiac Enzymes: No results for input(s): CKTOTAL, CKMB, CKMBINDEX, TROPONINI in the last 168 hours. BNP (last 3 results) No results for input(s): PROBNP in the last  8760 hours. HbA1C: No results for input(s): HGBA1C in the last 72 hours. CBG: Recent Labs  Lab 04/03/20 0943  GLUCAP 93   Lipid Profile: No results for input(s): CHOL, HDL, LDLCALC, TRIG, CHOLHDL, LDLDIRECT in the last 72 hours. Thyroid Function Tests: No results for input(s): TSH, T4TOTAL, FREET4, T3FREE, THYROIDAB in the last 72 hours. Anemia Panel: No results for input(s): VITAMINB12, FOLATE, FERRITIN, TIBC, IRON, RETICCTPCT in the last 72 hours. Urine analysis: No results found for: COLORURINE, APPEARANCEUR, LABSPEC, PHURINE, GLUCOSEU, HGBUR, BILIRUBINUR, KETONESUR, PROTEINUR, UROBILINOGEN, NITRITE, LEUKOCYTESUR  Radiological Exams on Admission: CT Head Wo Contrast  Result Date: 04/03/2020 CLINICAL DATA:  Right-sided weakness/numbness which has resolved. Previous history of coil embolization of intracerebral aneurysms. EXAM: CT HEAD WITHOUT CONTRAST TECHNIQUE: Contiguous axial images were obtained from the base of the skull through the vertex without intravenous contrast. COMPARISON:  01/20/2017 FINDINGS:  Brain: Ventricles, cisterns and other CSF spaces are within normal. There is evidence of chronic ischemic microvascular disease. There is a small old left occipital infarct. Possible old lacunar infarct over the left cerebellum. Moderate streak artifact from patient's previous aneurysm coiling involving bilateral posterior communicating arteries as well as region of the anterior communicating artery. No evidence of mass, mass effect or acute hemorrhage. No evidence of acute infarction. Vascular: Embolization coils as described. Skull: Normal. Negative for fracture or focal lesion. Sinuses/Orbits: No acute finding. Other: None. IMPRESSION: 1.  No acute findings. 2. 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 3. Chronic ischemic microvascular disease. Possible old left cerebellar lacunar infarct as well as small old left occipital infarct.  Electronically Signed   By: Elberta Fortis M.D.   On: 04/03/2020 10:54   MR ANGIO HEAD WO CONTRAST  Result Date: 04/03/2020 CLINICAL DATA:  TIA. Right-sided numbness and weakness. History of aneurysm coiling. EXAM: MRI HEAD WITHOUT AND WITH CONTRAST MRA HEAD WITHOUT   CONTRAST MRA NECK WITHOUT   CONTRAST TECHNIQUE: Multiplanar, multiecho pulse sequences of the brain and surrounding structures were obtained without and with intravenous contrast. Angiographic images of the head were obtained using MRA technique without contrast. Angiographic sequences of the neck and surrounding structures were obtained without intravenous contrast. CONTRAST:  7.55mL GADAVIST GADOBUTROL 1 MMOL/ML IV SOLN COMPARISON:  None. FINDINGS: MRI HEAD FINDINGS Brain: Multiple areas of recent infarction bilaterally. Acute/subacute infarct in the left frontal lobe over the convexity involving cortex and white matter. Cortical enhancement over the left frontal convexity without restricted diffusion suggesting late subacute infarct. Small area of acute or subacute infarct right medial parietal lobe. Subacute infarct with mild enhancement in the left parietal lobe. Cluster of small acute infarcts in the right posterior cerebellum with mild enhancement. Moderate chronic microvascular ischemic change in the white matter and pons. Mild atrophy without hydrocephalus. No intracranial hemorrhage or mass. Vascular: Normal arterial flow voids. Skull and upper cervical spine: No focal skeletal lesion. Sinuses/Orbits: Paranasal sinuses clear.  Normal orbit Other: None MRA HEAD FINDINGS Both vertebral arteries patent to the basilar. Left PICA patent. Right PICA not visualized but there is a right AICA which may contribute to this territory. Basilar widely patent. Fetal origin right posterior cerebral artery. Mild stenosis right P2 segment. Left posterior cerebral artery widely patent. Internal carotid artery patent bilaterally without stenosis. Hypoplastic  right A1 segment which is small. Left A1 widely patent. Occlusion left A2 segment. Mild stenosis right A2 segment. Middle cerebral arteries patent bilaterally without significant stenosis. MRA NECK FINDINGS Antegrade flow in carotid and vertebral arteries bilaterally. Carotid bifurcation patent bilaterally without stenosis. Both vertebral arteries are patent without stenosis. IMPRESSION: 1. Multiple areas of acute and subacute infarct in the brain bilaterally suggestive of emboli. 2. Moderate chronic microvascular ischemic change in the white matter and pons. 3. Occlusion left A2 segment and mild stenosis right A2 segment. Hypoplastic right A1 segment. 4. Mild stenosis right P2 segment. Middle cerebral arteries patent bilaterally. Electronically Signed   By: Marlan Palau M.D.   On: 04/03/2020 14:38   MR ANGIO NECK WO CONTRAST  Result Date: 04/03/2020 CLINICAL DATA:  TIA. Right-sided numbness and weakness. History of aneurysm coiling. EXAM: MRI HEAD WITHOUT AND WITH CONTRAST MRA HEAD WITHOUT   CONTRAST MRA NECK WITHOUT   CONTRAST TECHNIQUE: Multiplanar, multiecho pulse sequences of the brain and surrounding structures were obtained without and with intravenous contrast. Angiographic images of the head were obtained using  MRA technique without contrast. Angiographic sequences of the neck and surrounding structures were obtained without intravenous contrast. CONTRAST:  7.513mL GADAVIST GADOBUTROL 1 MMOL/ML IV SOLN COMPARISON:  None. FINDINGS: MRI HEAD FINDINGS Brain: Multiple areas of recent infarction bilaterally. Acute/subacute infarct in the left frontal lobe over the convexity involving cortex and white matter. Cortical enhancement over the left frontal convexity without restricted diffusion suggesting late subacute infarct. Small area of acute or subacute infarct right medial parietal lobe. Subacute infarct with mild enhancement in the left parietal lobe. Cluster of small acute infarcts in the right posterior  cerebellum with mild enhancement. Moderate chronic microvascular ischemic change in the white matter and pons. Mild atrophy without hydrocephalus. No intracranial hemorrhage or mass. Vascular: Normal arterial flow voids. Skull and upper cervical spine: No focal skeletal lesion. Sinuses/Orbits: Paranasal sinuses clear.  Normal orbit Other: None MRA HEAD FINDINGS Both vertebral arteries patent to the basilar. Left PICA patent. Right PICA not visualized but there is a right AICA which may contribute to this territory. Basilar widely patent. Fetal origin right posterior cerebral artery. Mild stenosis right P2 segment. Left posterior cerebral artery widely patent. Internal carotid artery patent bilaterally without stenosis. Hypoplastic right A1 segment which is small. Left A1 widely patent. Occlusion left A2 segment. Mild stenosis right A2 segment. Middle cerebral arteries patent bilaterally without significant stenosis. MRA NECK FINDINGS Antegrade flow in carotid and vertebral arteries bilaterally. Carotid bifurcation patent bilaterally without stenosis. Both vertebral arteries are patent without stenosis. IMPRESSION: 1. Multiple areas of acute and subacute infarct in the brain bilaterally suggestive of emboli. 2. Moderate chronic microvascular ischemic change in the white matter and pons. 3. Occlusion left A2 segment and mild stenosis right A2 segment. Hypoplastic right A1 segment. 4. Mild stenosis right P2 segment. Middle cerebral arteries patent bilaterally. Electronically Signed   By: Marlan Palauharles  Clark M.D.   On: 04/03/2020 14:38   MR Brain W and Wo Contrast  Result Date: 04/03/2020 CLINICAL DATA:  TIA. Right-sided numbness and weakness. History of aneurysm coiling. EXAM: MRI HEAD WITHOUT AND WITH CONTRAST MRA HEAD WITHOUT   CONTRAST MRA NECK WITHOUT   CONTRAST TECHNIQUE: Multiplanar, multiecho pulse sequences of the brain and surrounding structures were obtained without and with intravenous contrast. Angiographic  images of the head were obtained using MRA technique without contrast. Angiographic sequences of the neck and surrounding structures were obtained without intravenous contrast. CONTRAST:  7.543mL GADAVIST GADOBUTROL 1 MMOL/ML IV SOLN COMPARISON:  None. FINDINGS: MRI HEAD FINDINGS Brain: Multiple areas of recent infarction bilaterally. Acute/subacute infarct in the left frontal lobe over the convexity involving cortex and white matter. Cortical enhancement over the left frontal convexity without restricted diffusion suggesting late subacute infarct. Small area of acute or subacute infarct right medial parietal lobe. Subacute infarct with mild enhancement in the left parietal lobe. Cluster of small acute infarcts in the right posterior cerebellum with mild enhancement. Moderate chronic microvascular ischemic change in the white matter and pons. Mild atrophy without hydrocephalus. No intracranial hemorrhage or mass. Vascular: Normal arterial flow voids. Skull and upper cervical spine: No focal skeletal lesion. Sinuses/Orbits: Paranasal sinuses clear.  Normal orbit Other: None MRA HEAD FINDINGS Both vertebral arteries patent to the basilar. Left PICA patent. Right PICA not visualized but there is a right AICA which may contribute to this territory. Basilar widely patent. Fetal origin right posterior cerebral artery. Mild stenosis right P2 segment. Left posterior cerebral artery widely patent. Internal carotid artery patent bilaterally without stenosis. Hypoplastic right A1 segment which  is small. Left A1 widely patent. Occlusion left A2 segment. Mild stenosis right A2 segment. Middle cerebral arteries patent bilaterally without significant stenosis. MRA NECK FINDINGS Antegrade flow in carotid and vertebral arteries bilaterally. Carotid bifurcation patent bilaterally without stenosis. Both vertebral arteries are patent without stenosis. IMPRESSION: 1. Multiple areas of acute and subacute infarct in the brain bilaterally  suggestive of emboli. 2. Moderate chronic microvascular ischemic change in the white matter and pons. 3. Occlusion left A2 segment and mild stenosis right A2 segment. Hypoplastic right A1 segment. 4. Mild stenosis right P2 segment. Middle cerebral arteries patent bilaterally. Electronically Signed   By: Marlan Palau M.D.   On: 04/03/2020 14:38    EKG: Independently reviewed.   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 -Right side lower extremity numbness and heaviness   Close to complete resolution -dual antiplatelet therapy with plavix 75, asa 81 , patient is also s/p plavix load 300mg  -permissive HTN, prn for bp >185/110 mmHg -will also order echo per cva protocol  -place on tia/cva protocol : neuro checks q 4h, tele, PT/OT/speech  -await further neurology recs   HLD -continue statin   HTN  -uncontrolled , new diagnosis - permissive hypertension  -consider starting standing oral in 48 hours  -prn for bp >185/110   Abn cxr  -?right sided Infiltrate vs edema  -BNP /CT thorax pending -npo for due to concern for aspiration until formal speech eval  FEN:  Electrolytes stable  Replete lytes prn    DVT prophylaxis:scd  Code Status: Full  Family Communication: n/a Disposition Plan:  48-72 hours  Consults called: DR Admission status: inpatient    Amada Jupiter MD [facility]  If 7PM-7AM, please contact night-coverage [email or web address] Password [facility]  04/03/2020, 7:12 PM

## 2020-04-03 NOTE — ED Notes (Signed)
Patient transported to MRI 

## 2020-04-03 NOTE — ED Triage Notes (Signed)
Woke up at 4 am with R leg numbness. Went to bed at 10pm. Hx of CVA

## 2020-04-04 ENCOUNTER — Inpatient Hospital Stay (HOSPITAL_COMMUNITY): Payer: PRIVATE HEALTH INSURANCE

## 2020-04-04 ENCOUNTER — Other Ambulatory Visit: Payer: Self-pay

## 2020-04-04 DIAGNOSIS — E7800 Pure hypercholesterolemia, unspecified: Secondary | ICD-10-CM

## 2020-04-04 DIAGNOSIS — G459 Transient cerebral ischemic attack, unspecified: Secondary | ICD-10-CM

## 2020-04-04 DIAGNOSIS — I6340 Cerebral infarction due to embolism of unspecified cerebral artery: Principal | ICD-10-CM

## 2020-04-04 DIAGNOSIS — I10 Essential (primary) hypertension: Secondary | ICD-10-CM

## 2020-04-04 DIAGNOSIS — R911 Solitary pulmonary nodule: Secondary | ICD-10-CM

## 2020-04-04 DIAGNOSIS — F17200 Nicotine dependence, unspecified, uncomplicated: Secondary | ICD-10-CM

## 2020-04-04 DIAGNOSIS — I639 Cerebral infarction, unspecified: Secondary | ICD-10-CM

## 2020-04-04 DIAGNOSIS — J984 Other disorders of lung: Secondary | ICD-10-CM

## 2020-04-04 DIAGNOSIS — J439 Emphysema, unspecified: Secondary | ICD-10-CM

## 2020-04-04 LAB — BRAIN NATRIURETIC PEPTIDE: B Natriuretic Peptide: 144.9 pg/mL — ABNORMAL HIGH (ref 0.0–100.0)

## 2020-04-04 LAB — ECHOCARDIOGRAM COMPLETE
Height: 65 in
Weight: 2604.8 [oz_av]

## 2020-04-04 LAB — LIPID PANEL
Cholesterol: 139 mg/dL (ref 0–200)
HDL: 37 mg/dL — ABNORMAL LOW (ref >40)
LDL Cholesterol: 82 mg/dL (ref 0–99)
Total CHOL/HDL Ratio: 3.8 RATIO
Triglycerides: 98 mg/dL (ref <150)
VLDL: 20 mg/dL (ref 0–40)

## 2020-04-04 LAB — URINALYSIS, COMPLETE (UACMP) WITH MICROSCOPIC
Bilirubin Urine: NEGATIVE
Glucose, UA: NEGATIVE mg/dL
Ketones, ur: NEGATIVE mg/dL
Leukocytes,Ua: NEGATIVE
Nitrite: NEGATIVE
Protein, ur: NEGATIVE mg/dL
RBC / HPF: >50 RBC/hpf — ABNORMAL HIGH (ref 0–5)
Specific Gravity, Urine: 1.012 (ref 1.005–1.030)
pH: 6 (ref 5.0–8.0)

## 2020-04-04 LAB — TSH: TSH: 1.083 u[IU]/mL (ref 0.350–4.500)

## 2020-04-04 LAB — SEDIMENTATION RATE: Sed Rate: 6 mm/hr (ref 0–22)

## 2020-04-04 LAB — HIV ANTIBODY (ROUTINE TESTING W REFLEX): HIV Screen 4th Generation wRfx: NONREACTIVE

## 2020-04-04 LAB — RAPID URINE DRUG SCREEN, HOSP PERFORMED
Amphetamines: NOT DETECTED
Barbiturates: NOT DETECTED
Benzodiazepines: NOT DETECTED
Cocaine: NOT DETECTED
Opiates: NOT DETECTED
Tetrahydrocannabinol: NOT DETECTED

## 2020-04-04 LAB — C-REACTIVE PROTEIN: CRP: 0.5 mg/dL (ref <1.0)

## 2020-04-04 LAB — RPR: RPR Ser Ql: NONREACTIVE

## 2020-04-04 LAB — VITAMIN B12: Vitamin B-12: 407 pg/mL (ref 180–914)

## 2020-04-04 LAB — HEMOGLOBIN A1C
Hgb A1c MFr Bld: 6 % — ABNORMAL HIGH (ref 4.8–5.6)
Mean Plasma Glucose: 125.5 mg/dL

## 2020-04-04 IMAGING — CT CT CHEST W/O CM
2 of 3 series · 15 of 36 positions shown, 18 images · non-contrast
Comparison: [DATE]

CLINICAL DATA: Shortness of breath, right lung nodularity on chest
x-ray

EXAM:
CT CHEST WITHOUT CONTRAST
TECHNIQUE: Multidetector CT imaging of the chest was performed following the
standard protocol without IV contrast.

[Series 3: chest w/o 2mm st · axial · non-contrast · 0.81mm/px · z∈[-31,+257]mm · 12 of 170 slices shown, 15 images]
[im 13/170  mediastinal]
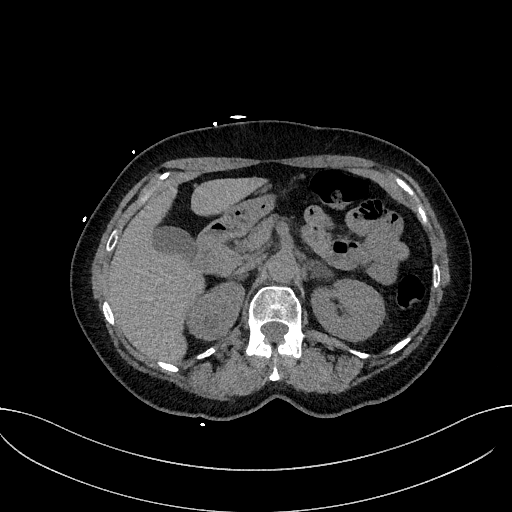
[im 13/170  lung]
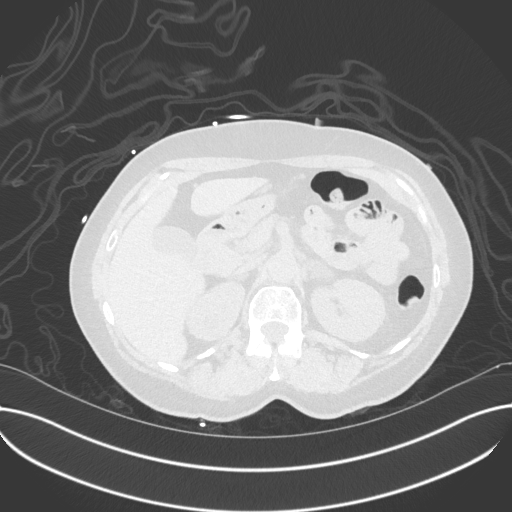
[im 26/170  lung]
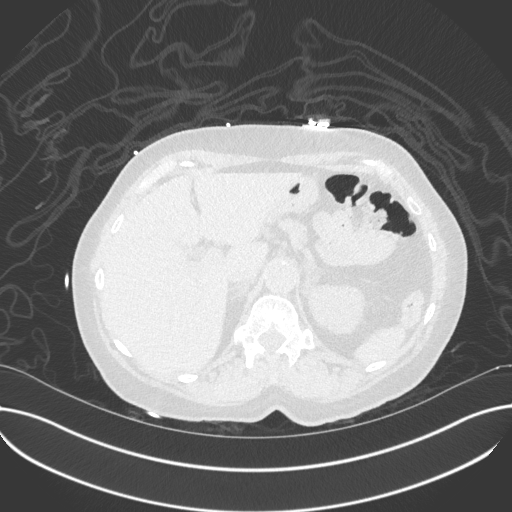
[im 38/170  lung]
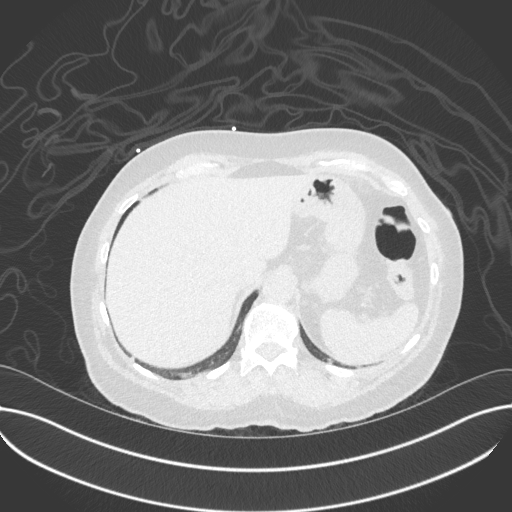
[im 51/170  lung]
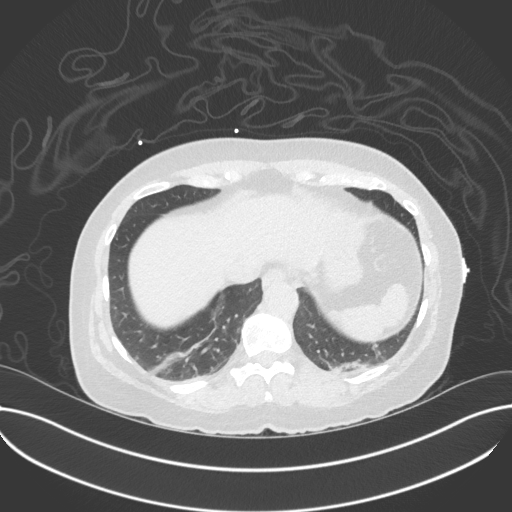
[im 63/170  mediastinal]
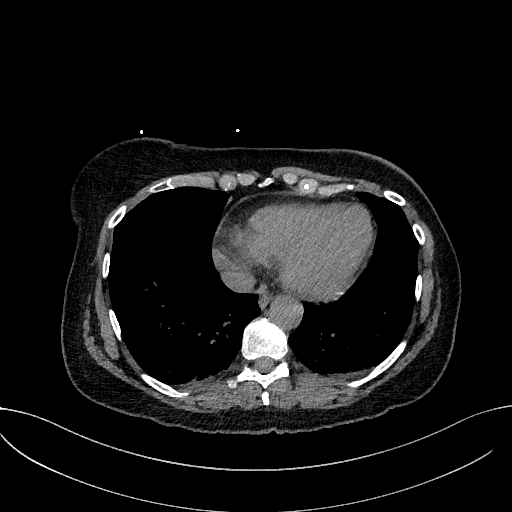
[im 63/170  lung]
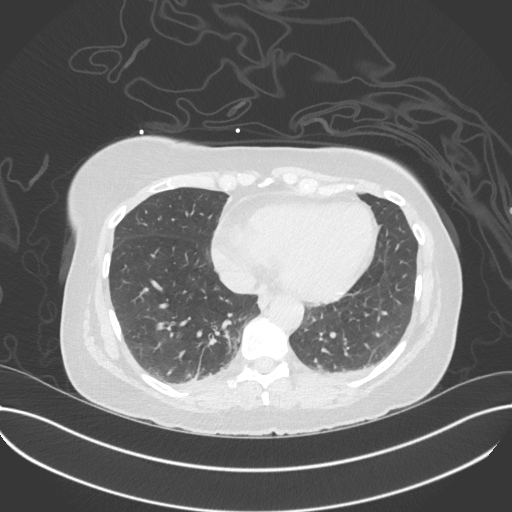
[im 76/170  lung]
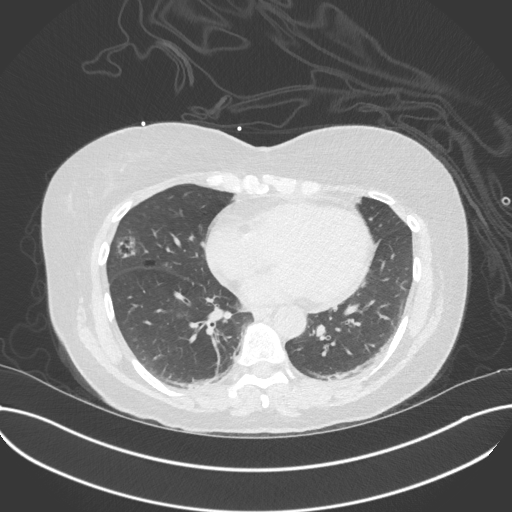
[im 94/170  lung]
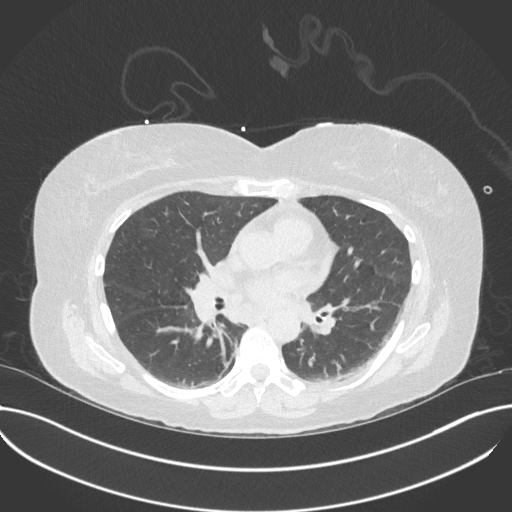
[im 107/170  lung]
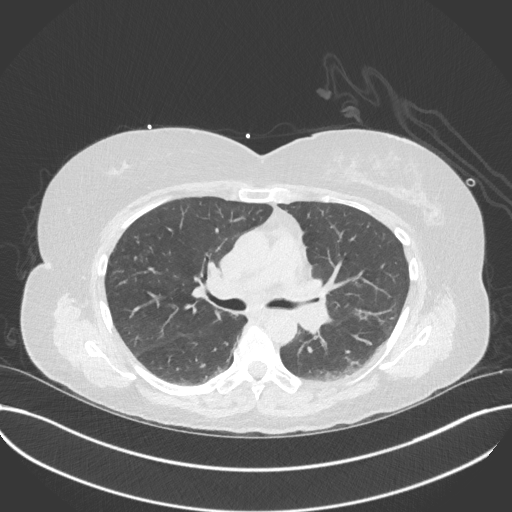
[im 119/170  mediastinal]
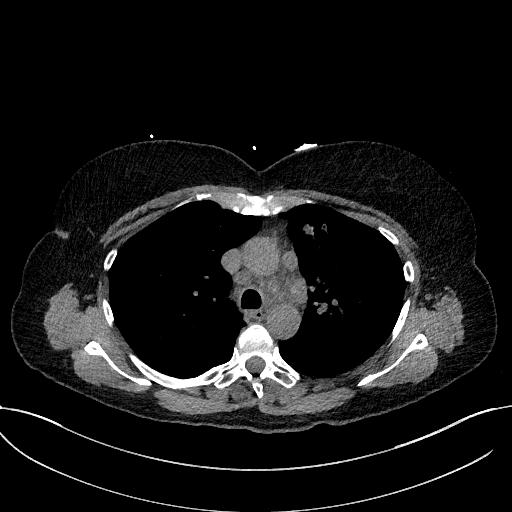
[im 119/170  lung]
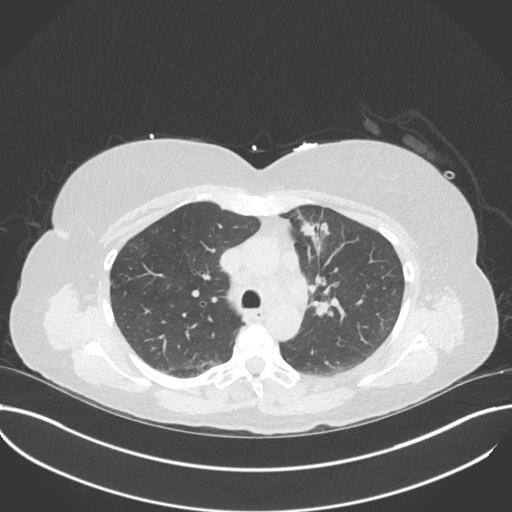
[im 132/170  lung]
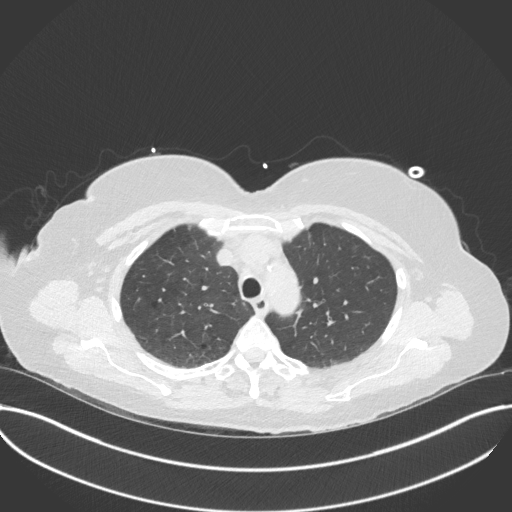
[im 144/170  lung]
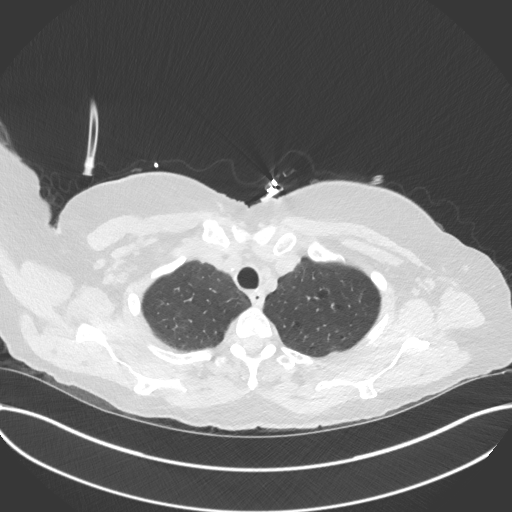
[im 157/170  lung]
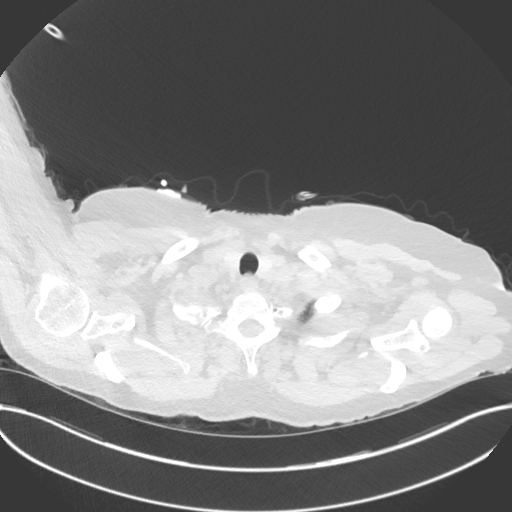

[Series 5: chest w/o 2mm st cor · coronal · non-contrast · 0.66mm/px · 3 of 144 slices shown]
[im 29/144  lung]
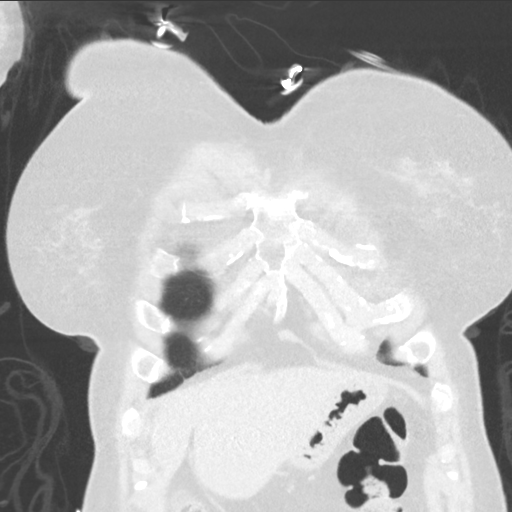
[im 58/144  lung]
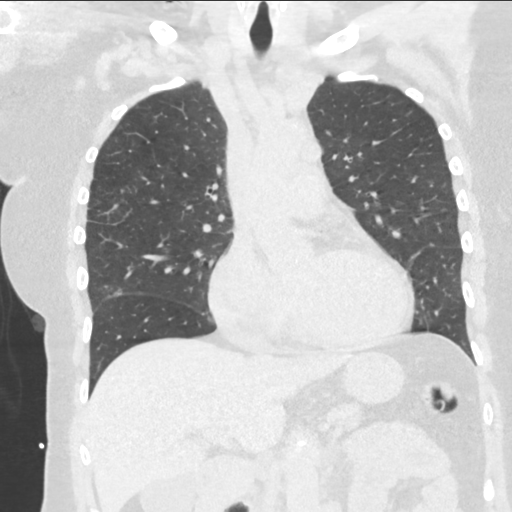
[im 86/144  lung]
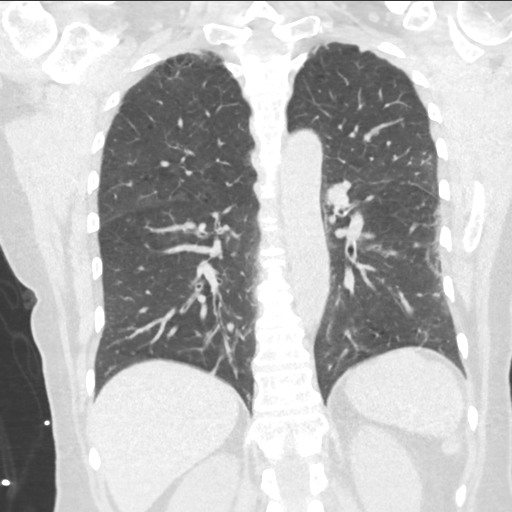

[15 of 36 positions shown; findings below may reference images not displayed]

FINDINGS: Cardiovascular: Unenhanced imaging of the heart and great vessels
demonstrates no pericardial effusion. Mild atherosclerosis of the
thoracic aorta.

Mediastinum/Nodes: Pathologic mediastinal adenopathy is identified.
Largest lymph node in the AP window measures 13 mm in short axis,
reference image 52/3. Precarinal adenopathy measures up to 14 mm in
short axis, reference image 56/3. Subcarinal and hilar adenopathy
are also identified, more difficult to measure given lack of IV
contrast.

The thyroid, trachea, and esophagus are grossly unremarkable.

Lungs/Pleura: Spiculated left upper lobe mass is identified with
peripheral cavitation, measuring up to 3.3 x 2.2 cm reference image
48/4.

There is a second cavitating nodule within the lateral segment right
middle lobe measuring 2.0 x 1.9 cm reference image 98/4. This
corresponds to chest x-ray finding.

There is background emphysema. No effusion or pneumothorax.
Dependent hypoventilatory changes are noted.

Upper Abdomen: Indeterminate 10 mm nodule within the left adrenal
gland is noted. The remainder of the upper abdomen is unremarkable.

Musculoskeletal: No acute or destructive bony lesions. Reconstructed
images demonstrate no additional findings.
IMPRESSION: 1. Left upper lobe and right middle lobe cavitating pulmonary
nodules. The left upper lobe nodule is spiculated. Malignancy is the
diagnosis of exclusion. Far less likely cavitating infection or
septic emboli could be considered in the appropriate setting.
2. Pathologic mediastinal and hilar adenopathy worrisome for nodal
metastases.
3. Nonspecific left adrenal nodule, metastatic disease not excluded.
4. Aortic Atherosclerosis ([QP]-[QP]) and Emphysema ([QP]-[QP]).

These results will be called to the ordering clinician or
representative by the Radiologist Assistant, and communication
documented in the PACS or [REDACTED].

## 2020-04-04 IMAGING — US US RENAL
1 series · 14 of 25 positions shown · non-contrast
Comparison: CT dated [DATE]

CLINICAL DATA: Hematuria

EXAM:
RENAL / URINARY TRACT ULTRASOUND COMPLETE

[Series 1: us renal · 14 of 29 slices shown]
[im 1/29]
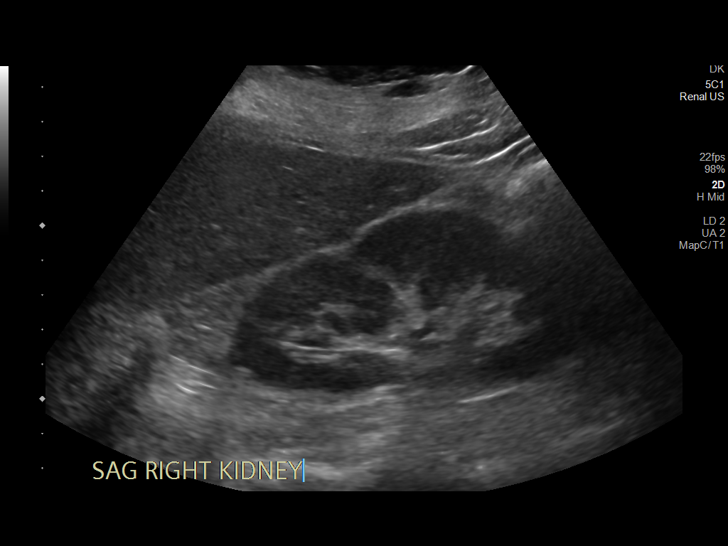
[im 3/29]
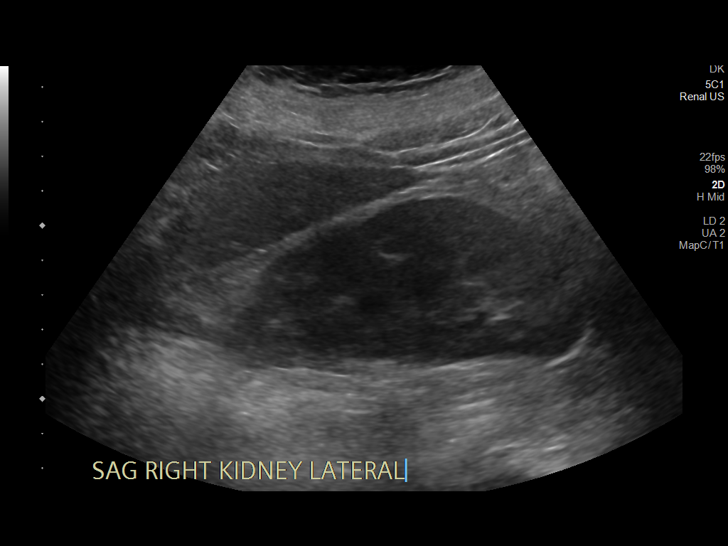
[im 5/29]
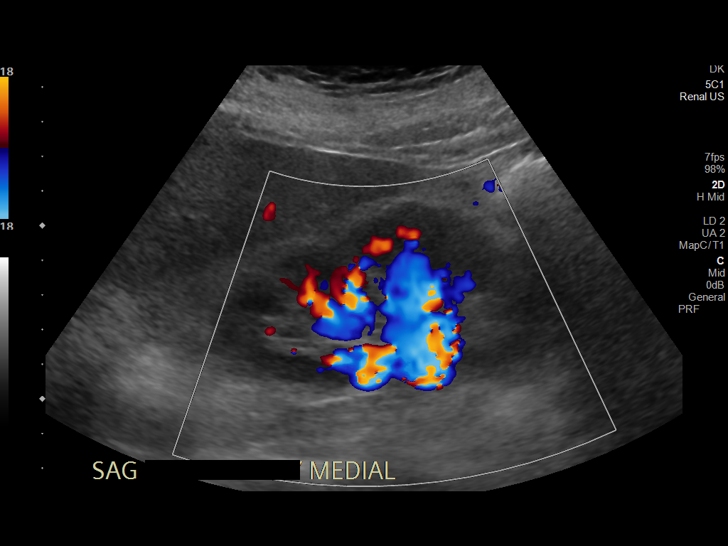
[im 8/29]
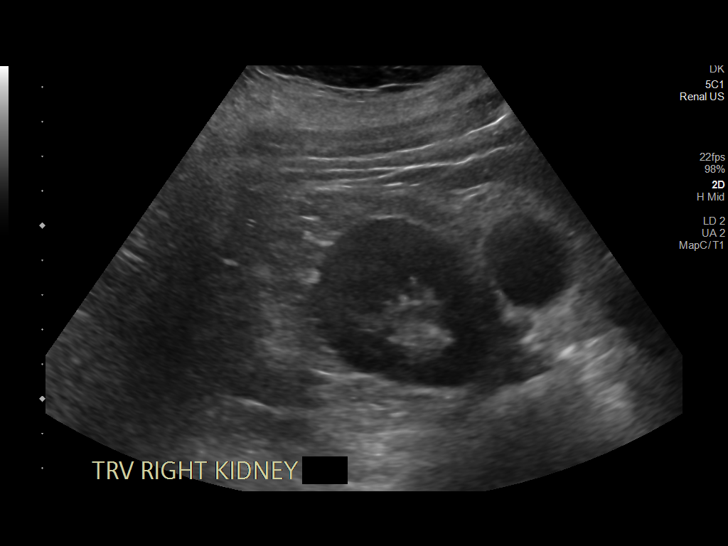
[im 10/29]
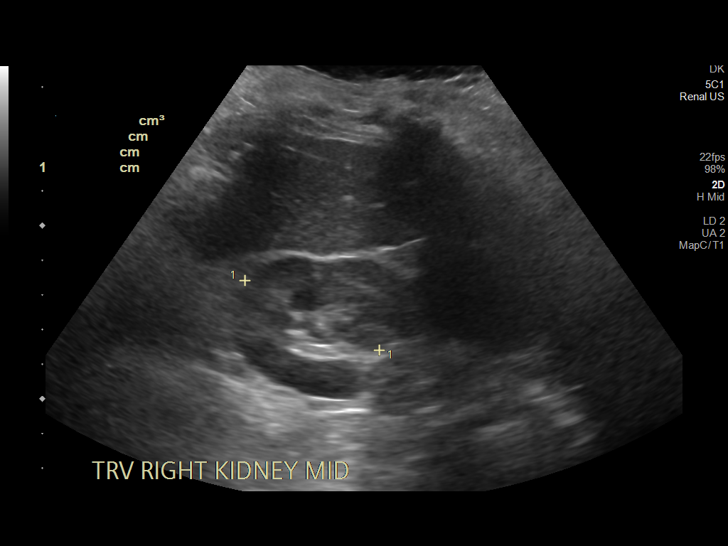
[im 11/29]
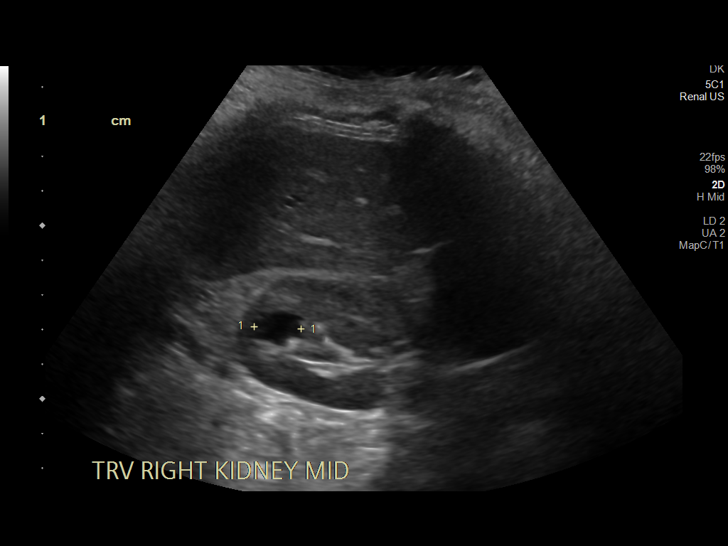
[im 13/29]
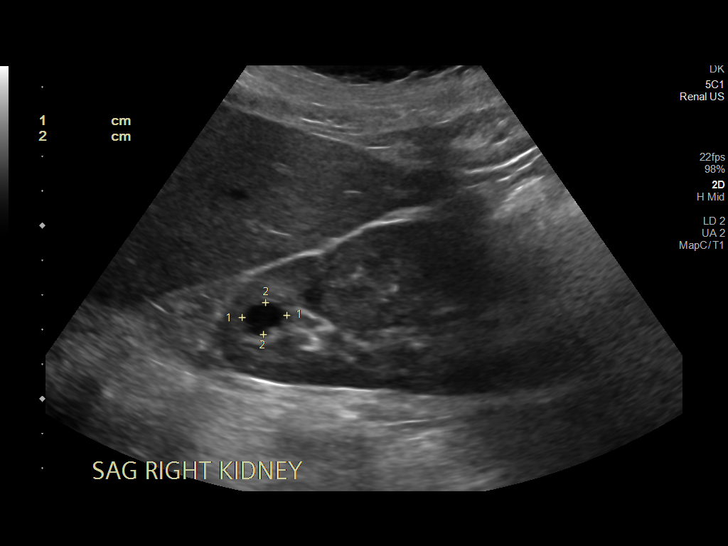
[im 16/29]
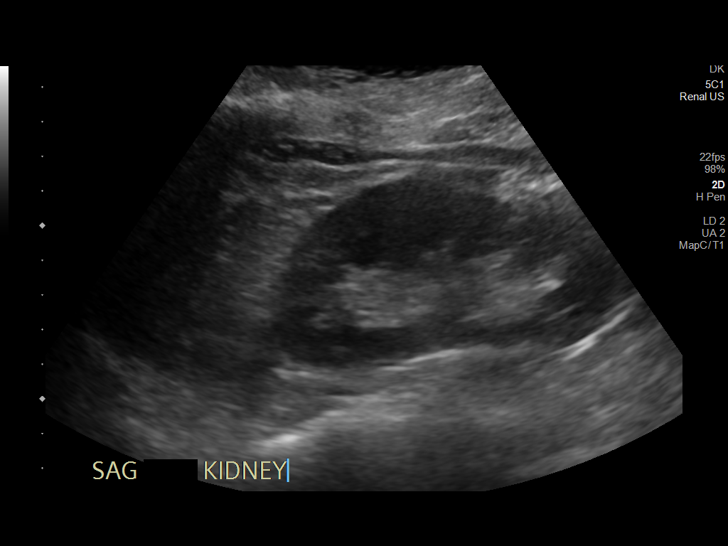
[im 18/29]
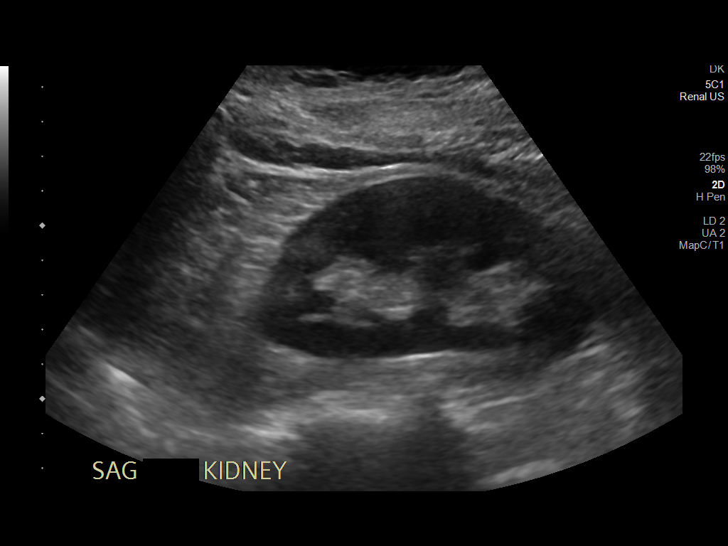
[im 19/29]
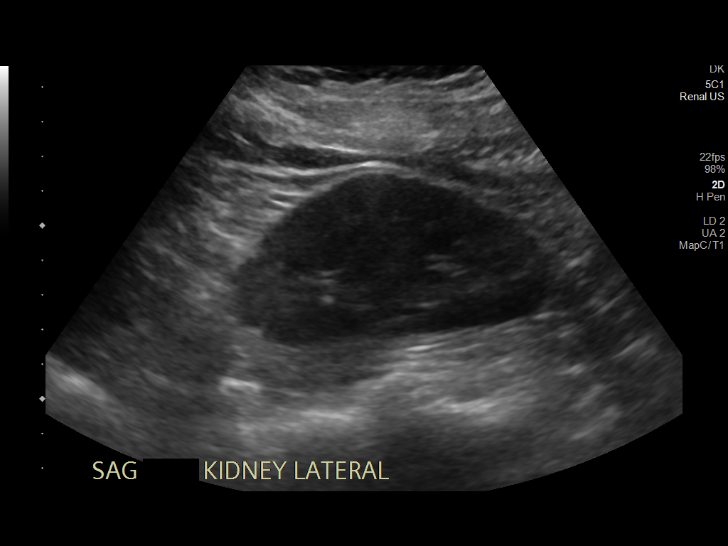
[im 22/29]
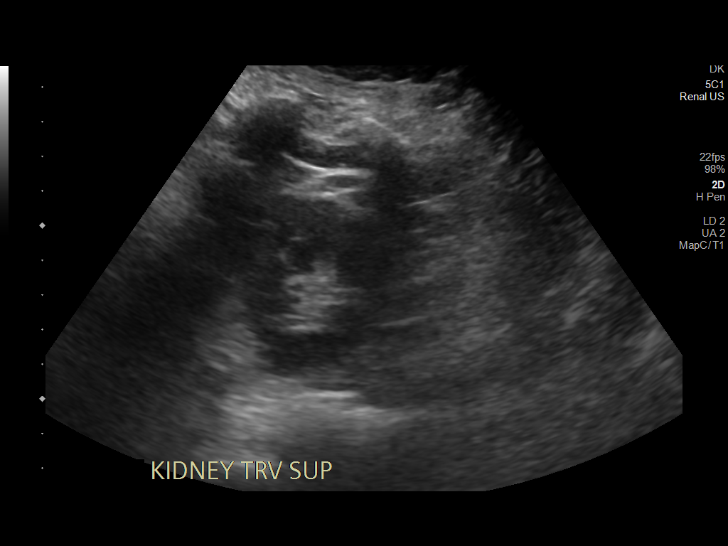
[im 24/29]
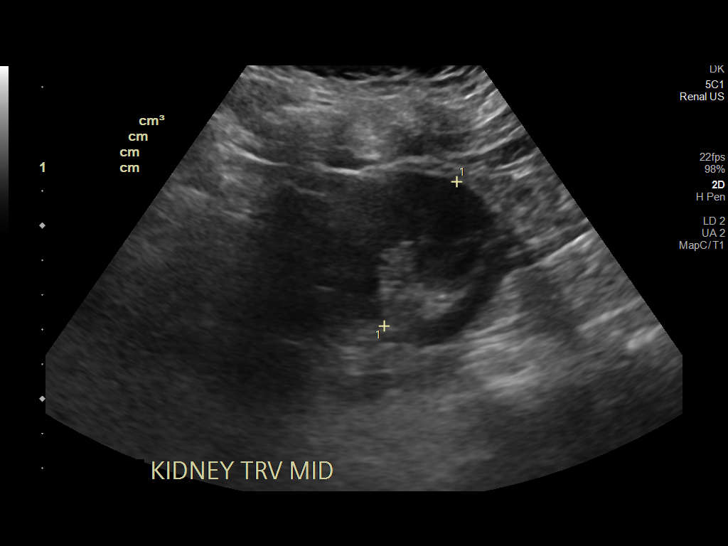
[im 26/29]
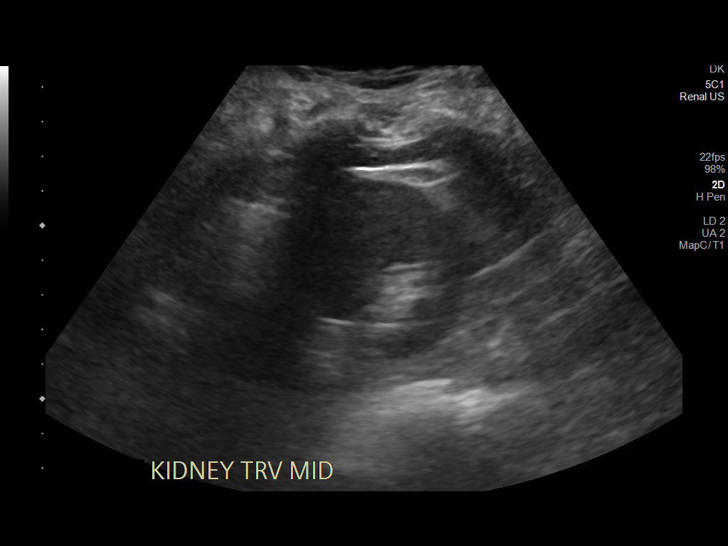
[im 29/29]
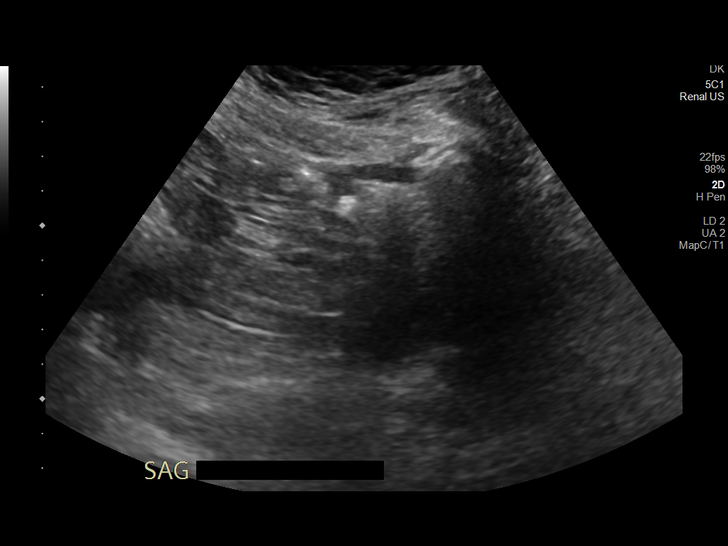

[14 of 25 positions shown; findings below may reference images not displayed]

FINDINGS: Right Kidney:

Renal measurements: 11.2 x 5.1 x 4.4 cm = volume: 130 mL .
Echogenicity within normal limits. No mass or hydronephrosis
visualized. A small 1.4 cm cyst is noted.

Left Kidney:

Renal measurements: 10.2 x 5.1 x 4.7 cm = volume: 126 mL.
Echogenicity within normal limits. No mass or hydronephrosis
visualized.

Bladder:

Appears normal for degree of bladder distention.

Other:

None.
IMPRESSION: Unremarkable study.

## 2020-04-04 MED ORDER — PERFLUTREN LIPID MICROSPHERE
1.0000 mL | INTRAVENOUS | Status: AC | PRN
  Administered 2020-04-04: 2 mL via INTRAVENOUS
  Filled 2020-04-04: qty 10

## 2020-04-04 MED ORDER — UMECLIDINIUM BROMIDE 62.5 MCG/INH IN AEPB
1.0000 | INHALATION_SPRAY | Freq: Every day | RESPIRATORY_TRACT | Status: DC
  Administered 2020-04-06: 1 via RESPIRATORY_TRACT
  Filled 2020-04-04: qty 7

## 2020-04-04 MED ORDER — ALBUTEROL SULFATE HFA 108 (90 BASE) MCG/ACT IN AERS: 2.0000 | INHALATION_SPRAY | Freq: Four times a day (QID) | RESPIRATORY_TRACT | Status: DC | PRN

## 2020-04-04 MED ORDER — TIOTROPIUM BROMIDE MONOHYDRATE 18 MCG IN CAPS: 18.0000 ug | ORAL_CAPSULE | Freq: Every day | RESPIRATORY_TRACT | Status: DC

## 2020-04-04 MED ORDER — ALBUTEROL SULFATE (2.5 MG/3ML) 0.083% IN NEBU: 2.5000 mg | INHALATION_SOLUTION | Freq: Four times a day (QID) | RESPIRATORY_TRACT | Status: DC | PRN

## 2020-04-04 MED ORDER — ATORVASTATIN CALCIUM 80 MG PO TABS
80.0000 mg | ORAL_TABLET | Freq: Every day | ORAL | Status: DC
  Administered 2020-04-04 – 2020-04-06 (×3): 80 mg via ORAL
  Filled 2020-04-04 (×3): qty 1

## 2020-04-04 NOTE — Consult Note (Signed)
NAME:  Jessica [name], MRN:  [id number], DOB:  19-Feb-1961, LOS: 1 ADMISSION DATE:  04/03/2020, CONSULTATION DATE:  04/04/20 REFERRING MD:  Dr Dartha Lodge, CHIEF COMPLAINT:  cavitating lung nodule   Brief History   See below  History of present illness    History obtained from the chart and talking to the husband and also neurologist  59 year old female history of hyperlipidemia, chronic heavy smoking, history of cerebral aneurysms status post coiling.  On April 04, 2019 when she presented with right-sided weakness and CT/MRI evidence of multiple areas of acute and subacute infarct in the brain suggestive of emboli and occlusion of the left A2 segment which was considered the most recent symptomatic event.  She was loaded on Plavix 300 mg on April 03, 2020 and since then has been on 75 mg once daily.  Also started on aspirin 81 mg once daily on the day of admission.  Overall she has improved although the significant slowness in speech and mild right-sided deficit.  During the stay she underwent CT scan of the chest that shows left upper lobe 3.3 cm spiculated mass with peripheral cavitation and right middle lobe cavitating pulmonary 2 cm mass in the lateral segment with cavitation and an indeterminate 10 mm nodule in the left adrenal gland.  There was also pathologic enlargement of mediastinal adenopathy in the precarinal and the subcarinal and hilar areas [noted noncontrasted CT] and therefore pulmonary has been consulted.  CT scan also showed background evidence of emphysema.    Patient husband reports history of cough for the last 8 months early in the morning when she smokes a cigarette.  Endorses that she been smoking cigarettes since age 39.  Otherwise no autoimmune disease or vasculitis.  No hemoptysis no weight loss.  No shortness of breath or wheezing no edema no proximal nocturnal dyspnea  According to the neurologist okay to stop Plavix for procedure if needed.  Past Medical History     has a  past medical history of High cholesterol and Stroke (HCC).   reports that she has been smoking cigarettes. She has never used smokeless tobacco.  Past Surgical History:  Procedure Laterality Date  . ABDOMINAL HYSTERECTOMY      No Known Allergies   There is no immunization history on file for this patient.  No family history on file.   Current Facility-Administered Medications:  .  acetaminophen (TYLENOL) tablet 650 mg, 650 mg, Oral, Q4H PRN **OR** acetaminophen (TYLENOL) 160 MG/5ML solution 650 mg, 650 mg, Per Tube, Q4H PRN **OR** acetaminophen (TYLENOL) suppository 650 mg, 650 mg, Rectal, Q4H PRN, Skip Mayer A, MD .  aspirin EC tablet 81 mg, 81 mg, Oral, Daily, Rejeana Brock, MD, 81 mg at 04/04/20 1034 .  atorvastatin (LIPITOR) tablet 80 mg, 80 mg, Oral, QHS, Marvel Plan, MD .  clopidogrel (PLAVIX) tablet 75 mg, 75 mg, Oral, Daily, Rejeana Brock, MD, 75 mg at 04/04/20 1034 .  perflutren lipid microspheres (DEFINITY) IV suspension, 1-10 mL, Intravenous, PRN, Berton Mount I, MD, 2 mL at 04/04/20 1628 .  senna-docusate (Senokot-S) tablet 1 tablet, 1 tablet, Oral, QHS PRN, Lurline Del, MD   Significant Hospital Events   x  Consults:  x  Procedures:  x  Significant Diagnostic Tests:  x  Micro Data:  x  Antimicrobials:  x   Interim history/subjective:  x  Objective   Blood pressure 118/72, pulse 73, temperature 98.5 F (36.9 C), temperature source Oral, resp. rate 20, height 5'  5" (1.651 m), weight 73.8 kg, SpO2 96 %.       No intake or output data in the 24 hours ending 04/04/20 1814 Filed Weights   04/03/20 0943  Weight: 73.8 kg    Examination: General: Pleasant female watching TV lying in bed.  Husband at the bedside. HENT: No neck nodes no elevated JVP.  Oral cavity is normal no thrush Lungs: Clear to auscultation bilaterally.  No wheeze no crackles Cardiovascular: Regular rate and rhythm no murmurs Abdomen: Soft  nontender no organomegaly Extremities: No cyanosis no clubbing no edema Neuro: Alert and oriented x3 but slow speech.  Very mild pronator drift on the right side GU: Not examined   LABS    PULMONARY No results for input(s): PHART, PCO2ART, PO2ART, HCO3, TCO2, O2SAT in the last 168 hours.  Invalid input(s): PCO2, PO2  CBC Recent Labs  Lab 04/03/20 1004  HGB 13.3  HCT 41.3  WBC 8.1  PLT 188    COAGULATION Recent Labs  Lab 04/03/20 1004  INR 1.1    CARDIAC  No results for input(s): TROPONINI in the last 168 hours. No results for input(s): PROBNP in the last 168 hours.   CHEMISTRY Recent Labs  Lab 04/03/20 1004  NA 142  K 3.8  CL 107  CO2 24  GLUCOSE 101*  BUN 10  CREATININE 0.48  CALCIUM 8.9   Estimated Creatinine Clearance: 74.3 mL/min (by C-G formula based on SCr of 0.48 mg/dL).   LIVER Recent Labs  Lab 04/03/20 1004  AST 18  ALT 22  ALKPHOS 108  BILITOT 0.7  PROT 7.1  ALBUMIN 4.0  INR 1.1     INFECTIOUS No results for input(s): LATICACIDVEN, PROCALCITON in the last 168 hours.   ENDOCRINE CBG (last 3)  Recent Labs    04/03/20 0943  GLUCAP 93         IMAGING x48h  - image(s) personally visualized  -   highlighted in bold DG Chest 2 View  Result Date: 04/03/2020 CLINICAL DATA:  TIA EXAM: CHEST - 2 VIEW COMPARISON:  Radiograph 01/20/2017 FINDINGS: Low lung volumes with streaky basilar areas of atelectasis. A more nodular masslike opacity is present in the right mid to lower lung. There is mild central vascular congestion and central airways thickening. No pneumothorax or visible effusion. The aorta is calcified. The remaining cardiomediastinal contours are unremarkable. No acute osseous or soft tissue abnormality. IMPRESSION: 1. Low lung volumes with streaky basilar atelectasis. 2. Additional central vascular congestion, airways thickening, and few septal lines could reflect developing interstitial edema. 3. Nodular opacity in the  right mid to lower lung, recommend further evaluation with CT imaging. Electronically Signed   By: Kreg Shropshire M.D.   On: 04/03/2020 21:36   CT Head Wo Contrast  Result Date: 04/03/2020 CLINICAL DATA:  Right-sided weakness/numbness which has resolved. Previous history of coil embolization of intracerebral aneurysms. EXAM: CT HEAD WITHOUT CONTRAST TECHNIQUE: Contiguous axial images were obtained from the base of the skull through the vertex without intravenous contrast. COMPARISON:  01/20/2017 FINDINGS: Brain: Ventricles, cisterns and other CSF spaces are within normal. There is evidence of chronic ischemic microvascular disease. There is a small old left occipital infarct. Possible old lacunar infarct over the left cerebellum. Moderate streak artifact from patient's previous aneurysm coiling involving bilateral posterior communicating arteries as well as region of the anterior communicating artery. No evidence of mass, mass effect or acute hemorrhage. No evidence of acute infarction. Vascular: Embolization coils as described. Skull: Normal.  Negative for fracture or focal lesion. Sinuses/Orbits: No acute finding. Other: None. IMPRESSION: 1.  No acute findings. 2. 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 3. Chronic ischemic microvascular disease. Possible old left cerebellar lacunar infarct as well as small old left occipital infarct. Electronically Signed   By: Elberta Fortis M.D.   On: 04/03/2020 10:54   CT CHEST WO CONTRAST  Result Date: 04/04/2020 CLINICAL DATA:  Shortness of breath, right lung nodularity on chest x-ray EXAM: CT CHEST WITHOUT CONTRAST TECHNIQUE: Multidetector CT imaging of the chest was performed following the standard protocol without IV contrast. COMPARISON:  04/03/2020 FINDINGS: Cardiovascular: Unenhanced imaging of the heart and great vessels demonstrates no pericardial effusion. Mild atherosclerosis of the thoracic aorta.  Mediastinum/Nodes: Pathologic mediastinal adenopathy is identified. Largest lymph node in the AP window measures 13 mm in short axis, reference image 52/3. Precarinal adenopathy measures up to 14 mm in short axis, reference image 56/3. Subcarinal and hilar adenopathy are also identified, more difficult to measure given lack of IV contrast. The thyroid, trachea, and esophagus are grossly unremarkable. Lungs/Pleura: Spiculated left upper lobe mass is identified with peripheral cavitation, measuring up to 3.3 x 2.2 cm reference image 48/4. There is a second cavitating nodule within the lateral segment right middle lobe measuring 2.0 x 1.9 cm reference image 98/4. This corresponds to chest x-ray finding. There is background emphysema. No effusion or pneumothorax. Dependent hypoventilatory changes are noted. Upper Abdomen: Indeterminate 10 mm nodule within the left adrenal gland is noted. The remainder of the upper abdomen is unremarkable. Musculoskeletal: No acute or destructive bony lesions. Reconstructed images demonstrate no additional findings. IMPRESSION: 1. 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 The left upper lobe nodule is spiculated. Malignancy is the diagnosis of exclusion. Far less likely cavitating infection or septic emboli could be considered in the appropriate setting. 2. Pathologic mediastinal and hilar adenopathy worrisome for nodal metastases. 3. Nonspecific left adrenal nodule, metastatic disease not excluded. 4. Aortic Atherosclerosis (ICD10-I70.0) and Emphysema (ICD10-J43.9). These results will be called to the ordering clinician or representative by the Radiologist Assistant, and communication documented in the PACS or [employer]. Electronically Signed   By: Sharlet Salina M.D.   On: 04/04/2020 01:41   MR ANGIO HEAD WO CONTRAST  Result Date: 04/03/2020 CLINICAL DATA:  TIA. Right-sided numbness and weakness. History of aneurysm coiling. EXAM: MRI HEAD WITHOUT  AND WITH CONTRAST MRA HEAD WITHOUT   CONTRAST MRA NECK WITHOUT   CONTRAST TECHNIQUE: Multiplanar, multiecho pulse sequences of the brain and surrounding structures were obtained without and with intravenous contrast. Angiographic images of the head were obtained using MRA technique without contrast. Angiographic sequences of the neck and surrounding structures were obtained without intravenous contrast. CONTRAST:  7.91mL GADAVIST GADOBUTROL 1 MMOL/ML IV SOLN COMPARISON:  None. FINDINGS: MRI HEAD FINDINGS Brain: Multiple areas of recent infarction bilaterally. Acute/subacute infarct in the left frontal lobe over the convexity involving cortex and white matter. Cortical enhancement over the left frontal convexity without restricted diffusion suggesting late subacute infarct. Small area of acute or subacute infarct right medial parietal lobe. Subacute infarct with mild enhancement in the left parietal lobe. Cluster of small acute infarcts in the right posterior cerebellum with mild enhancement. Moderate chronic microvascular ischemic change in the white matter and pons. Mild atrophy without hydrocephalus. No intracranial hemorrhage or mass. Vascular: Normal arterial flow voids. Skull and upper cervical spine: No focal skeletal lesion. Sinuses/Orbits: Paranasal sinuses clear.  Normal  orbit Other: None MRA HEAD FINDINGS Both vertebral arteries patent to the basilar. Left PICA patent. Right PICA not visualized but there is a right AICA which may contribute to this territory. Basilar widely patent. Fetal origin right posterior cerebral artery. Mild stenosis right P2 segment. Left posterior cerebral artery widely patent. Internal carotid artery patent bilaterally without stenosis. Hypoplastic right A1 segment which is small. Left A1 widely patent. Occlusion left A2 segment. Mild stenosis right A2 segment. Middle cerebral arteries patent bilaterally without significant stenosis. MRA NECK FINDINGS Antegrade flow in carotid and  vertebral arteries bilaterally. Carotid bifurcation patent bilaterally without stenosis. Both vertebral arteries are patent without stenosis. IMPRESSION: 1. Multiple areas of acute and subacute infarct in the brain bilaterally suggestive of emboli. 2. Moderate chronic microvascular ischemic change in the white matter and pons. 3. Occlusion left A2 segment and mild stenosis right A2 segment. Hypoplastic right A1 segment. 4. Mild stenosis right P2 segment. Middle cerebral arteries patent bilaterally. Electronically Signed   By: Marlan Palau M.D.   On: 04/03/2020 14:38   MR ANGIO NECK WO CONTRAST  Result Date: 04/03/2020 CLINICAL DATA:  TIA. Right-sided numbness and weakness. History of aneurysm coiling. EXAM: MRI HEAD WITHOUT AND WITH CONTRAST MRA HEAD WITHOUT   CONTRAST MRA NECK WITHOUT   CONTRAST TECHNIQUE: Multiplanar, multiecho pulse sequences of the brain and surrounding structures were obtained without and with intravenous contrast. Angiographic images of the head were obtained using MRA technique without contrast. Angiographic sequences of the neck and surrounding structures were obtained without intravenous contrast. CONTRAST:  7.72mL GADAVIST GADOBUTROL 1 MMOL/ML IV SOLN COMPARISON:  None. FINDINGS: MRI HEAD FINDINGS Brain: Multiple areas of recent infarction bilaterally. Acute/subacute infarct in the left frontal lobe over the convexity involving cortex and white matter. Cortical enhancement over the left frontal convexity without restricted diffusion suggesting late subacute infarct. Small area of acute or subacute infarct right medial parietal lobe. Subacute infarct with mild enhancement in the left parietal lobe. Cluster of small acute infarcts in the right posterior cerebellum with mild enhancement. Moderate chronic microvascular ischemic change in the white matter and pons. Mild atrophy without hydrocephalus. No intracranial hemorrhage or mass. Vascular: Normal arterial flow voids. Skull and upper  cervical spine: No focal skeletal lesion. Sinuses/Orbits: Paranasal sinuses clear.  Normal orbit Other: None MRA HEAD FINDINGS Both vertebral arteries patent to the basilar. Left PICA patent. Right PICA not visualized but there is a right AICA which may contribute to this territory. Basilar widely patent. Fetal origin right posterior cerebral artery. Mild stenosis right P2 segment. Left posterior cerebral artery widely patent. Internal carotid artery patent bilaterally without stenosis. Hypoplastic right A1 segment which is small. Left A1 widely patent. Occlusion left A2 segment. Mild stenosis right A2 segment. Middle cerebral arteries patent bilaterally without significant stenosis. MRA NECK FINDINGS Antegrade flow in carotid and vertebral arteries bilaterally. Carotid bifurcation patent bilaterally without stenosis. Both vertebral arteries are patent without stenosis. IMPRESSION: 1. Multiple areas of acute and subacute infarct in the brain bilaterally suggestive of emboli. 2. Moderate chronic microvascular ischemic change in the white matter and pons. 3. Occlusion left A2 segment and mild stenosis right A2 segment. Hypoplastic right A1 segment. 4. Mild stenosis right P2 segment. Middle cerebral arteries patent bilaterally. Electronically Signed   By: Marlan Palau M.D.   On: 04/03/2020 14:38   MR Brain W and Wo Contrast  Result Date: 04/03/2020 CLINICAL DATA:  TIA. Right-sided numbness and weakness. History of aneurysm coiling. EXAM: MRI HEAD WITHOUT AND  WITH CONTRAST MRA HEAD WITHOUT   CONTRAST MRA NECK WITHOUT   CONTRAST TECHNIQUE: Multiplanar, multiecho pulse sequences of the brain and surrounding structures were obtained without and with intravenous contrast. Angiographic images of the head were obtained using MRA technique without contrast. Angiographic sequences of the neck and surrounding structures were obtained without intravenous contrast. CONTRAST:  7.643mL GADAVIST GADOBUTROL 1 MMOL/ML IV SOLN  COMPARISON:  None. FINDINGS: MRI HEAD FINDINGS Brain: Multiple areas of recent infarction bilaterally. Acute/subacute infarct in the left frontal lobe over the convexity involving cortex and white matter. Cortical enhancement over the left frontal convexity without restricted diffusion suggesting late subacute infarct. Small area of acute or subacute infarct right medial parietal lobe. Subacute infarct with mild enhancement in the left parietal lobe. Cluster of small acute infarcts in the right posterior cerebellum with mild enhancement. Moderate chronic microvascular ischemic change in the white matter and pons. Mild atrophy without hydrocephalus. No intracranial hemorrhage or mass. Vascular: Normal arterial flow voids. Skull and upper cervical spine: No focal skeletal lesion. Sinuses/Orbits: Paranasal sinuses clear.  Normal orbit Other: None MRA HEAD FINDINGS Both vertebral arteries patent to the basilar. Left PICA patent. Right PICA not visualized but there is a right AICA which may contribute to this territory. Basilar widely patent. Fetal origin right posterior cerebral artery. Mild stenosis right P2 segment. Left posterior cerebral artery widely patent. Internal carotid artery patent bilaterally without stenosis. Hypoplastic right A1 segment which is small. Left A1 widely patent. Occlusion left A2 segment. Mild stenosis right A2 segment. Middle cerebral arteries patent bilaterally without significant stenosis. MRA NECK FINDINGS Antegrade flow in carotid and vertebral arteries bilaterally. Carotid bifurcation patent bilaterally without stenosis. Both vertebral arteries are patent without stenosis. IMPRESSION: 1. Multiple areas of acute and subacute infarct in the brain bilaterally suggestive of emboli. 2. Moderate chronic microvascular ischemic change in the white matter and pons. 3. Occlusion left A2 segment and mild stenosis right A2 segment. Hypoplastic right A1 segment. 4. Mild stenosis right P2 segment.  Middle cerebral arteries patent bilaterally. Electronically Signed   By: Marlan Palauharles  Clark M.D.   On: 04/03/2020 14:38   ECHOCARDIOGRAM COMPLETE  Result Date: 04/04/2020    ECHOCARDIOGRAM REPORT   Patient Name:   Angelena SoleJANET Chaffin Date of Exam: 04/04/2020 Medical Rec #:  [id number]    Height:       65.0 in Accession #:    [id number]   Weight:       162.8 lb Date of Birth:  1961-06-03    BSA:          1.812 m Patient Age:    59 years     BP:           162/87 mmHg Patient Gender: F            HR:           74 bpm. Exam Location:  Inpatient Procedure: 2D Echo, Color Doppler, Cardiac Doppler and Intracardiac            Opacification Agent Indications:    TIA  History:        Patient has no prior history of Echocardiogram examinations.  Sonographer:    Roosvelt Maserachel Lane RDCS Referring Phys: [id number] SARA-MAIZ A THOMAS IMPRESSIONS  1. Left ventricular ejection fraction, by estimation, is 60 to 65%. The left ventricle has normal function. The left ventricle has no regional wall motion abnormalities. Left ventricular diastolic parameters are consistent with Grade I diastolic dysfunction (impaired relaxation).  2. Right  ventricular systolic function is normal. The right ventricular size is normal.  3. The mitral valve is normal in structure. No evidence of mitral valve regurgitation. No evidence of mitral stenosis.  4. The aortic valve is normal in structure. Aortic valve regurgitation is mild. No aortic stenosis is present.  5. The inferior vena cava is normal in size with greater than 50% respiratory variability, suggesting right atrial pressure of 3 mmHg. FINDINGS  Left Ventricle: Left ventricular ejection fraction, by estimation, is 60 to 65%. The left ventricle has normal function. The left ventricle has no regional wall motion abnormalities. Definity contrast agent was given IV to delineate the left ventricular  endocardial borders. The left ventricular internal cavity size was normal in size. There is no left ventricular  hypertrophy. Left ventricular diastolic parameters are consistent with Grade I diastolic dysfunction (impaired relaxation). Normal left ventricular filling pressure. Right Ventricle: The right ventricular size is normal. No increase in right ventricular wall thickness. Right ventricular systolic function is normal. Left Atrium: Left atrial size was normal in size. Right Atrium: Right atrial size was normal in size. Pericardium: There is no evidence of pericardial effusion. Mitral Valve: The mitral valve is normal in structure. Normal mobility of the mitral valve leaflets. No evidence of mitral valve regurgitation. No evidence of mitral valve stenosis. Tricuspid Valve: The tricuspid valve is normal in structure. Tricuspid valve regurgitation is not demonstrated. No evidence of tricuspid stenosis. Aortic Valve: The aortic valve is normal in structure. Aortic valve regurgitation is mild. No aortic stenosis is present. Pulmonic Valve: The pulmonic valve was normal in structure. Pulmonic valve regurgitation is not visualized. No evidence of pulmonic stenosis. Aorta: The aortic root is normal in size and structure. Venous: The inferior vena cava is normal in size with greater than 50% respiratory variability, suggesting right atrial pressure of 3 mmHg. IAS/Shunts: No atrial level shunt detected by color flow Doppler.  LEFT VENTRICLE PLAX 2D LVIDd:         4.30 cm  Diastology LVIDs:         2.80 cm  LV e' lateral:   6.31 cm/s LV PW:         0.80 cm  LV E/e' lateral: 8.7 LV IVS:        0.80 cm  LV e' medial:    5.00 cm/s LVOT diam:     1.90 cm  LV E/e' medial:  11.0 LVOT Area:     2.84 cm  RIGHT VENTRICLE          IVC RV Basal diam:  2.80 cm  IVC diam: 1.20 cm LEFT ATRIUM           Index       RIGHT ATRIUM           Index LA diam:      2.20 cm 1.21 cm/m  RA Area:     16.70 cm LA Vol (A4C): 68.6 ml 37.85 ml/m RA Volume:   45.60 ml  25.16 ml/m   AORTA Ao Root diam: 2.80 cm Ao Asc diam:  2.90 cm MITRAL VALVE MV Area  (PHT): 4.31 cm    SHUNTS MV Decel Time: 176 msec    Systemic Diam: 1.90 cm MV E velocity: 54.80 cm/s MV A velocity: 79.70 cm/s MV E/A ratio:  0.69 Mihai Croitoru MD Electronically signed by Thurmon Fair MD Signature Date/Time: 04/04/2020/5:21:05 PM    Final    VAS [place] LOWER EXTREMITY VENOUS (DVT)  Result Date: 04/04/2020  Lower  Venous DVTStudy Indications: Stroke.  Limitations: Positioning. Comparison Study: No prior study on file Performing Technologist: Sharion Dove RVS  Examination Guidelines: A complete evaluation includes B-mode imaging, spectral Doppler, color Doppler, and power Doppler as needed of all accessible portions of each vessel. Bilateral testing is considered an integral part of a complete examination. Limited examinations for reoccurring indications may be performed as noted. The reflux portion of the exam is performed with the patient in reverse Trendelenburg.  +---------+---------------+---------+-----------+----------+--------------+ RIGHT    CompressibilityPhasicitySpontaneityPropertiesThrombus Aging +---------+---------------+---------+-----------+----------+--------------+ CFV      Full           Yes      Yes                                 +---------+---------------+---------+-----------+----------+--------------+ SFJ      Full                                                        +---------+---------------+---------+-----------+----------+--------------+ FV Prox  Full                                                        +---------+---------------+---------+-----------+----------+--------------+ FV Mid   Full                                                        +---------+---------------+---------+-----------+----------+--------------+ FV DistalFull                                                        +---------+---------------+---------+-----------+----------+--------------+ PFV      Full                                                         +---------+---------------+---------+-----------+----------+--------------+ POP      Full           Yes      Yes                                 +---------+---------------+---------+-----------+----------+--------------+ PTV      Full                                                        +---------+---------------+---------+-----------+----------+--------------+ PERO     Full                                                        +---------+---------------+---------+-----------+----------+--------------+   +---------+---------------+---------+-----------+----------+--------------+  LEFT     CompressibilityPhasicitySpontaneityPropertiesThrombus Aging +---------+---------------+---------+-----------+----------+--------------+ CFV      Full           Yes      Yes                                 +---------+---------------+---------+-----------+----------+--------------+ SFJ      Full                                                        +---------+---------------+---------+-----------+----------+--------------+ FV Prox  Full                                                        +---------+---------------+---------+-----------+----------+--------------+ FV Mid   Full                                                        +---------+---------------+---------+-----------+----------+--------------+ FV DistalFull                                                        +---------+---------------+---------+-----------+----------+--------------+ PFV      Full                                                        +---------+---------------+---------+-----------+----------+--------------+ POP      Full           Yes      Yes                                 +---------+---------------+---------+-----------+----------+--------------+ PTV      Full                                                         +---------+---------------+---------+-----------+----------+--------------+ PERO     Full                                                        +---------+---------------+---------+-----------+----------+--------------+     Summary: BILATERAL: - No evidence of deep vein thrombosis seen in the lower extremities, bilaterally. -   *See table(s) above for measurements and observations.    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 Right middle lobe 2 cm lateral segment middle lobe cavitating nodule Hilar and mediastinal adenopathy   Highly concerning for lung cancer Differential diagnosis include septic emboli and autoimmune vasculitis [although unlikely]   Plan  - start spirivan respimat for emphysema with alb prn  - needs bronch EBUS +/-ENB   - now that plavix loaded need to be off it for 1 week -might as well get PET scan as opd and then decide on Bronch timing  - will dc plavix for now (gott dsoe 6/26 and 6/27)  - check autoimmune, vasculitis and sarcoid serology for completeness - check Quantiferon gold  Overall it appears she come off plavix now  -> gets blood work now -> PET as opd -> then EBUS/ENB by [facility]/Icard  Best practice:  Per [facility] MD  D.w Dr Roda Shutters neurology   PCCM will followu   SIGNATURE    Dr. Kalman Shan, M.D., F.C.C.P,  Pulmonary and Critical Care Medicine Staff Physician, [facility] Director - Interstitial Lung Disease  Program  Pulmonary Fibrosis [facility] at [facility] [place], [place], [address]  Pager: [phone], If no answer or between  15:00h - 7:00h: call [phone] Telephone: [phone]  6:14 PM 04/04/2020

## 2020-04-04 NOTE — Progress Notes (Signed)
Inpatient Rehab Admissions Coordinator:   Met with patient at bedside to discuss potential CIR admission. Pt. Stated interest, spoke with Pt.'s husband on the phone and he confirmed 24/7 support. Will pursue for potential admit next week, pending bed availability.   Clemens Catholic, [place], [place] Admissions Coordinator  [phone] ([place]) [phone] (office)

## 2020-04-04 NOTE — Progress Notes (Signed)
PROGRESS NOTE    Jessica [name] Thau  WUJ:[id number] DOB: 05-03-1961 DOA: 04/03/2020 PCP: Patient, No Pcp Per  Outpatient Specialists:   Brief Narrative:  As per H&P done by Alda BertholdSara-Mais Thomas "Jessica [name] Cline is a 59 y.o. female with medical history significant of  HLD , cerebral aneurysms status post coiling,presents to ed with complaint of R leg numbness that she noted on awaking at 4am the morning of admission. She describes symptoms as heaviness and numbness of her right leg.She states the prior evening before going to bed at 10 pm she  was in her normal state of health. She states her symptoms lasted 30 minutes then resolved. She however s/p resolution of described symptoms felt that her right side was still  Somewhat weak compared to her baseline. However she denies any falls or difficulty with her balance or ambulation.  She denies any associated, confusion, HA, speech difficulties, difficulties with swallowing, facial asymmetry, but does mention milder R upper extremity symptoms but no any other focal areas of paresthesias or limb weakness. She also denied any vision changes, presyncope, sob, chest pain, fever chills, n/v, back pain , dysuria, or bowel or bladder incontinence. She does however note slowing of her thought process.    ED Course:  Afeb, bp 166/78, hr 73, rr 21 , sat 95% on ra  Neuro exam non-focal no noted deficits"   CT scan:  1.No acute findings.  2. 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  3. Chronic ischemic microvascular disease. Possible old left cerebellar lacunar infarct as well as small old left occipital infarct.  MRI/MRA: 1. Multiple areas of acute and subacute infarct in the brain bilaterally suggestive of emboli. 2. Moderate chronic microvascular ischemic change in the white matter and pons. 3. Occlusion left A2 segment and mild stenosis right A2 segment. Hypoplastic right A1 segment. 4. Mild stenosis right  P2 segment. Middle cerebral arteries patent Bilaterally.  CT chest without contrast revealed: 1. 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 The left upper lobe nodule is spiculated. Malignancy is the diagnosis of exclusion. Far less likely cavitating infection or septic emboli could be considered in the appropriate setting. 2. Pathologic mediastinal and hilar adenopathy worrisome for nodal metastases. 3. Nonspecific left adrenal nodule, metastatic disease not excluded. 4. Aortic Atherosclerosis (ICD10-I70.0) and Emphysema (ICD10-J43.9).  04/04/2020: Patient seen alongside patient's sister and brother.  Patient is not particularly talkative.  Patient seemed slowed.  The history is that patient developed right-sided weakness at work.  MRI brain is as documented above. CT Chest is suggestive of possible malignancy.  UA reveals large Hemoglobin and greater than RBC. Will order renal ultrasound.   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  Hyperlipidemia: -continue statin   Hypertension: -Controlled.  Abnormal chest x-ray and CT chest:  -CT chest is as documented above.   -Results concerning for possible malignancy.   -I have consulted with the pulmonary team (Dr. Marchelle Gearingamaswamy will see patient in consultation)  -Current acute CVA will likely interfere with patient's work-up, but will defer to the pulmonary team.  Microscopic hematuria: -Renal ultrasound. -Further management depend on above.  DVT prophylaxis: SCD  Code Status: Full code  Family Communication: Brother and sister Disposition Plan: This will depend on hospital  course   Consultants:   Neurology.  Pulmonary.  Procedures:   None  Antimicrobials:   None  Subjective: No significant history from patient  Objective: Vitals:   04/03/20 1946 04/03/20 2337 04/04/20 0306 04/04/20 0717  BP: (!) 155/105 (!) 153/77 (!) 162/86 (!) 152/68  Pulse:  76 78 73  Resp: (!) 21 20 (!) 24 20  Temp: 98 F (36.7 C) 98.6 F (37 C) 98.4 F (36.9 C) 98.2 F (36.8 C)  TempSrc: Oral Oral Oral Oral  SpO2: 95% 94% 97% 95%  Weight:      Height:       No intake or output data in the 24 hours ending 04/04/20 1132 Filed Weights   04/03/20 0943  Weight: 73.8 kg    Examination:  General exam: Slowed.  Not very talkative.  Appears calm and comfortable  Respiratory system: Clear to auscultation.  Cardiovascular system: S1 & S2 heard Gastrointestinal system: Abdomen is nondistended, soft and nontender. No organomegaly or masses felt. Normal bowel sounds heard. Central nervous system: Awake and alert.  Slowed.  Patient moves all extremities.  Mild right-sided weakness noted.  Extremities: No leg edema  Data Reviewed: I have personally reviewed following labs and imaging studies  CBC: Recent Labs  Lab 04/03/20 1004  WBC 8.1  NEUTROABS 4.8  HGB 13.3  HCT 41.3  MCV 91.0  PLT 188   Basic Metabolic Panel: Recent Labs  Lab 04/03/20 1004  NA 142  K 3.8  CL 107  CO2 24  GLUCOSE 101*  BUN 10  CREATININE 0.48  CALCIUM 8.9   GFR: Estimated Creatinine Clearance: 74.3 mL/min (by C-G formula based on SCr of 0.48 mg/dL). Liver Function Tests: Recent Labs  Lab 04/03/20 1004  AST 18  ALT 22  ALKPHOS 108  BILITOT 0.7  PROT 7.1  ALBUMIN 4.0   No results for input(s): LIPASE, AMYLASE in the last 168 hours. No results for input(s): AMMONIA in the last 168 hours. Coagulation Profile: Recent Labs  Lab 04/03/20 1004  INR 1.1   Cardiac Enzymes: No results for input(s): CKTOTAL, CKMB, CKMBINDEX, TROPONINI in the last 168 hours. BNP (last 3  results) No results for input(s): PROBNP in the last 8760 hours. HbA1C: Recent Labs    04/04/20 0321  HGBA1C 6.0*   CBG: Recent Labs  Lab 04/03/20 0943  GLUCAP 93   Lipid Profile: Recent Labs    04/04/20 0321  CHOL 139  HDL 37*  LDLCALC 82  TRIG 98  CHOLHDL 3.8   Thyroid Function Tests: Recent Labs    04/04/20 0733  TSH 1.083   Anemia Panel: Recent Labs    04/04/20 0733  VITAMINB12 407   Urine analysis:    Component Value Date/Time   COLORURINE YELLOW 04/04/2020 0644   APPEARANCEUR CLEAR 04/04/2020 0644   LABSPEC 1.012 04/04/2020 0644   PHURINE 6.0 04/04/2020 0644   GLUCOSEU NEGATIVE 04/04/2020 0644   HGBUR LARGE (A) 04/04/2020 0644   BILIRUBINUR NEGATIVE 04/04/2020 0644   KETONESUR NEGATIVE 04/04/2020 0644   PROTEINUR NEGATIVE 04/04/2020 0644   NITRITE NEGATIVE 04/04/2020 0644   LEUKOCYTESUR NEGATIVE 04/04/2020 0644   Sepsis Labs: @LABRCNTIP (procalcitonin:4,lacticidven:4)  ) Recent Results (from the past 240 hour(s))  SARS Coronavirus 2 by RT PCR (hospital order, performed in [facility] hospital lab) Nasopharyngeal Nasopharyngeal Swab     Status: None   Collection Time: 04/03/20  2:01  PM   Specimen: Nasopharyngeal Swab  Result Value Ref Range Status   SARS Coronavirus 2 NEGATIVE NEGATIVE Final    Comment: (NOTE) SARS-CoV-2 target nucleic acids are NOT DETECTED.  The SARS-CoV-2 RNA is generally detectable in upper and lower respiratory specimens during the acute phase of infection. The lowest concentration of SARS-CoV-2 viral copies this assay can detect is 250 copies / mL. A negative result does not preclude SARS-CoV-2 infection and should not be used as the sole basis for treatment or other patient management decisions.  A negative result may occur with improper specimen collection / handling, submission of specimen other than nasopharyngeal swab, presence of viral mutation(s) within the areas targeted by this assay, and inadequate number of  viral copies (<250 copies / mL). A negative result must be combined with clinical observations, patient history, and epidemiological information.  Fact Sheet for Patients:   BoilerBrush.com.cy  Fact Sheet for Healthcare Providers: https://pope.com/  This test is not yet approved or  cleared by the [place] FDA and has been authorized for detection and/or diagnosis of SARS-CoV-2 by FDA under an Emergency Use Authorization (EUA).  This EUA will remain in effect (meaning this test can be used) for the duration of the COVID-19 declaration under Section 564(b)(1) of the Act, 21 U.S.C. section 360bbb-3(b)(1), unless the authorization is terminated or revoked sooner.  Performed at [facility], [address], [place], [place] [address]          Radiology Studies: DG Chest 2 View  Result Date: 04/03/2020 CLINICAL DATA:  TIA EXAM: CHEST - 2 VIEW COMPARISON:  Radiograph 01/20/2017 FINDINGS: Low lung volumes with streaky basilar areas of atelectasis. A more nodular masslike opacity is present in the right mid to lower lung. There is mild central vascular congestion and central airways thickening. No pneumothorax or visible effusion. The aorta is calcified. The remaining cardiomediastinal contours are unremarkable. No acute osseous or soft tissue abnormality. IMPRESSION: 1. Low lung volumes with streaky basilar atelectasis. 2. Additional central vascular congestion, airways thickening, and few septal lines could reflect developing interstitial edema. 3. Nodular opacity in the right mid to lower lung, recommend further evaluation with CT imaging. Electronically Signed   By: Kreg Shropshire M.D.   On: 04/03/2020 21:36   CT Head Wo Contrast  Result Date: 04/03/2020 CLINICAL DATA:  Right-sided weakness/numbness which has resolved. Previous history of coil embolization of intracerebral aneurysms. EXAM: CT HEAD WITHOUT CONTRAST TECHNIQUE:  Contiguous axial images were obtained from the base of the skull through the vertex without intravenous contrast. COMPARISON:  01/20/2017 FINDINGS: Brain: Ventricles, cisterns and other CSF spaces are within normal. There is evidence of chronic ischemic microvascular disease. There is a small old left occipital infarct. Possible old lacunar infarct over the left cerebellum. Moderate streak artifact from patient's previous aneurysm coiling involving bilateral posterior communicating arteries as well as region of the anterior communicating artery. No evidence of mass, mass effect or acute hemorrhage. No evidence of acute infarction. Vascular: Embolization coils as described. Skull: Normal. Negative for fracture or focal lesion. Sinuses/Orbits: No acute finding. Other: None. IMPRESSION: 1.  No acute findings. 2. 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 3. Chronic ischemic microvascular disease. Possible old left cerebellar lacunar infarct as well as small old left occipital infarct. Electronically Signed   By: Elberta Fortis M.D.   On: 04/03/2020 10:54   CT CHEST WO CONTRAST  Result Date: 04/04/2020 CLINICAL DATA:  Shortness of breath,  right lung nodularity on chest x-ray EXAM: CT CHEST WITHOUT CONTRAST TECHNIQUE: Multidetector CT imaging of the chest was performed following the standard protocol without IV contrast. COMPARISON:  04/03/2020 FINDINGS: Cardiovascular: Unenhanced imaging of the heart and great vessels demonstrates no pericardial effusion. Mild atherosclerosis of the thoracic aorta. Mediastinum/Nodes: Pathologic mediastinal adenopathy is identified. Largest lymph node in the AP window measures 13 mm in short axis, reference image 52/3. Precarinal adenopathy measures up to 14 mm in short axis, reference image 56/3. Subcarinal and hilar adenopathy are also identified, more difficult to measure given lack of IV contrast. The thyroid, trachea, and esophagus  are grossly unremarkable. Lungs/Pleura: Spiculated left upper lobe mass is identified with peripheral cavitation, measuring up to 3.3 x 2.2 cm reference image 48/4. There is a second cavitating nodule within the lateral segment right middle lobe measuring 2.0 x 1.9 cm reference image 98/4. This corresponds to chest x-ray finding. There is background emphysema. No effusion or pneumothorax. Dependent hypoventilatory changes are noted. Upper Abdomen: Indeterminate 10 mm nodule within the left adrenal gland is noted. The remainder of the upper abdomen is unremarkable. Musculoskeletal: No acute or destructive bony lesions. Reconstructed images demonstrate no additional findings. IMPRESSION: 1. 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 The left upper lobe nodule is spiculated. Malignancy is the diagnosis of exclusion. Far less likely cavitating infection or septic emboli could be considered in the appropriate setting. 2. Pathologic mediastinal and hilar adenopathy worrisome for nodal metastases. 3. Nonspecific left adrenal nodule, metastatic disease not excluded. 4. Aortic Atherosclerosis (ICD10-I70.0) and Emphysema (ICD10-J43.9). These results will be called to the ordering clinician or representative by the Radiologist Assistant, and communication documented in the PACS or [employer]. Electronically Signed   By: Sharlet Salina M.D.   On: 04/04/2020 01:41   MR ANGIO HEAD WO CONTRAST  Result Date: 04/03/2020 CLINICAL DATA:  TIA. Right-sided numbness and weakness. History of aneurysm coiling. EXAM: MRI HEAD WITHOUT AND WITH CONTRAST MRA HEAD WITHOUT   CONTRAST MRA NECK WITHOUT   CONTRAST TECHNIQUE: Multiplanar, multiecho pulse sequences of the brain and surrounding structures were obtained without and with intravenous contrast. Angiographic images of the head were obtained using MRA technique without contrast. Angiographic sequences of the neck and surrounding structures were obtained  without intravenous contrast. CONTRAST:  7.54mL GADAVIST GADOBUTROL 1 MMOL/ML IV SOLN COMPARISON:  None. FINDINGS: MRI HEAD FINDINGS Brain: Multiple areas of recent infarction bilaterally. Acute/subacute infarct in the left frontal lobe over the convexity involving cortex and white matter. Cortical enhancement over the left frontal convexity without restricted diffusion suggesting late subacute infarct. Small area of acute or subacute infarct right medial parietal lobe. Subacute infarct with mild enhancement in the left parietal lobe. Cluster of small acute infarcts in the right posterior cerebellum with mild enhancement. Moderate chronic microvascular ischemic change in the white matter and pons. Mild atrophy without hydrocephalus. No intracranial hemorrhage or mass. Vascular: Normal arterial flow voids. Skull and upper cervical spine: No focal skeletal lesion. Sinuses/Orbits: Paranasal sinuses clear.  Normal orbit Other: None MRA HEAD FINDINGS Both vertebral arteries patent to the basilar. Left PICA patent. Right PICA not visualized but there is a right AICA which may contribute to this territory. Basilar widely patent. Fetal origin right posterior cerebral artery. Mild stenosis right P2 segment. Left posterior cerebral artery widely patent. Internal carotid artery patent bilaterally without stenosis. Hypoplastic right A1 segment which is small. Left A1 widely patent. Occlusion left A2 segment. Mild stenosis right A2 segment.  Middle cerebral arteries patent bilaterally without significant stenosis. MRA NECK FINDINGS Antegrade flow in carotid and vertebral arteries bilaterally. Carotid bifurcation patent bilaterally without stenosis. Both vertebral arteries are patent without stenosis. IMPRESSION: 1. Multiple areas of acute and subacute infarct in the brain bilaterally suggestive of emboli. 2. Moderate chronic microvascular ischemic change in the white matter and pons. 3. Occlusion left A2 segment and mild stenosis  right A2 segment. Hypoplastic right A1 segment. 4. Mild stenosis right P2 segment. Middle cerebral arteries patent bilaterally. Electronically Signed   By: Marlan Palau M.D.   On: 04/03/2020 14:38   MR ANGIO NECK WO CONTRAST  Result Date: 04/03/2020 CLINICAL DATA:  TIA. Right-sided numbness and weakness. History of aneurysm coiling. EXAM: MRI HEAD WITHOUT AND WITH CONTRAST MRA HEAD WITHOUT   CONTRAST MRA NECK WITHOUT   CONTRAST TECHNIQUE: Multiplanar, multiecho pulse sequences of the brain and surrounding structures were obtained without and with intravenous contrast. Angiographic images of the head were obtained using MRA technique without contrast. Angiographic sequences of the neck and surrounding structures were obtained without intravenous contrast. CONTRAST:  7.87mL GADAVIST GADOBUTROL 1 MMOL/ML IV SOLN COMPARISON:  None. FINDINGS: MRI HEAD FINDINGS Brain: Multiple areas of recent infarction bilaterally. Acute/subacute infarct in the left frontal lobe over the convexity involving cortex and white matter. Cortical enhancement over the left frontal convexity without restricted diffusion suggesting late subacute infarct. Small area of acute or subacute infarct right medial parietal lobe. Subacute infarct with mild enhancement in the left parietal lobe. Cluster of small acute infarcts in the right posterior cerebellum with mild enhancement. Moderate chronic microvascular ischemic change in the white matter and pons. Mild atrophy without hydrocephalus. No intracranial hemorrhage or mass. Vascular: Normal arterial flow voids. Skull and upper cervical spine: No focal skeletal lesion. Sinuses/Orbits: Paranasal sinuses clear.  Normal orbit Other: None MRA HEAD FINDINGS Both vertebral arteries patent to the basilar. Left PICA patent. Right PICA not visualized but there is a right AICA which may contribute to this territory. Basilar widely patent. Fetal origin right posterior cerebral artery. Mild stenosis right P2  segment. Left posterior cerebral artery widely patent. Internal carotid artery patent bilaterally without stenosis. Hypoplastic right A1 segment which is small. Left A1 widely patent. Occlusion left A2 segment. Mild stenosis right A2 segment. Middle cerebral arteries patent bilaterally without significant stenosis. MRA NECK FINDINGS Antegrade flow in carotid and vertebral arteries bilaterally. Carotid bifurcation patent bilaterally without stenosis. Both vertebral arteries are patent without stenosis. IMPRESSION: 1. Multiple areas of acute and subacute infarct in the brain bilaterally suggestive of emboli. 2. Moderate chronic microvascular ischemic change in the white matter and pons. 3. Occlusion left A2 segment and mild stenosis right A2 segment. Hypoplastic right A1 segment. 4. Mild stenosis right P2 segment. Middle cerebral arteries patent bilaterally. Electronically Signed   By: Marlan Palau M.D.   On: 04/03/2020 14:38   MR Brain W and Wo Contrast  Result Date: 04/03/2020 CLINICAL DATA:  TIA. Right-sided numbness and weakness. History of aneurysm coiling. EXAM: MRI HEAD WITHOUT AND WITH CONTRAST MRA HEAD WITHOUT   CONTRAST MRA NECK WITHOUT   CONTRAST TECHNIQUE: Multiplanar, multiecho pulse sequences of the brain and surrounding structures were obtained without and with intravenous contrast. Angiographic images of the head were obtained using MRA technique without contrast. Angiographic sequences of the neck and surrounding structures were obtained without intravenous contrast. CONTRAST:  7.40mL GADAVIST GADOBUTROL 1 MMOL/ML IV SOLN COMPARISON:  None. FINDINGS: MRI HEAD FINDINGS Brain: Multiple areas of recent infarction  bilaterally. Acute/subacute infarct in the left frontal lobe over the convexity involving cortex and white matter. Cortical enhancement over the left frontal convexity without restricted diffusion suggesting late subacute infarct. Small area of acute or subacute infarct right medial  parietal lobe. Subacute infarct with mild enhancement in the left parietal lobe. Cluster of small acute infarcts in the right posterior cerebellum with mild enhancement. Moderate chronic microvascular ischemic change in the white matter and pons. Mild atrophy without hydrocephalus. No intracranial hemorrhage or mass. Vascular: Normal arterial flow voids. Skull and upper cervical spine: No focal skeletal lesion. Sinuses/Orbits: Paranasal sinuses clear.  Normal orbit Other: None MRA HEAD FINDINGS Both vertebral arteries patent to the basilar. Left PICA patent. Right PICA not visualized but there is a right AICA which may contribute to this territory. Basilar widely patent. Fetal origin right posterior cerebral artery. Mild stenosis right P2 segment. Left posterior cerebral artery widely patent. Internal carotid artery patent bilaterally without stenosis. Hypoplastic right A1 segment which is small. Left A1 widely patent. Occlusion left A2 segment. Mild stenosis right A2 segment. Middle cerebral arteries patent bilaterally without significant stenosis. MRA NECK FINDINGS Antegrade flow in carotid and vertebral arteries bilaterally. Carotid bifurcation patent bilaterally without stenosis. Both vertebral arteries are patent without stenosis. IMPRESSION: 1. Multiple areas of acute and subacute infarct in the brain bilaterally suggestive of emboli. 2. Moderate chronic microvascular ischemic change in the white matter and pons. 3. Occlusion left A2 segment and mild stenosis right A2 segment. Hypoplastic right A1 segment. 4. Mild stenosis right P2 segment. Middle cerebral arteries patent bilaterally. Electronically Signed   By: Franchot Gallo M.D.   On: 04/03/2020 14:38        Scheduled Meds: . aspirin EC  81 mg Oral Daily  . atorvastatin  80 mg Oral QHS  . clopidogrel  75 mg Oral Daily   Continuous Infusions:   LOS: 1 day    Time spent: 35 minutes.    Dana Allan, MD  [facility] Pager #:  [phone] 7PM-7AM contact night coverage as above

## 2020-04-04 NOTE — Evaluation (Signed)
Physical Therapy Evaluation Patient Details Name: Jessica [name] MRN: [id number] DOB: 10-30-1960 Today's Date: 04/04/2020   History of Present Illness  Patient is a 59 y/o female presenting to the ED on 04/03/20 with primary complaints of R sided weakness. 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 Patient admitted for further work-up. PMH of HLD , cerebral aneurysms status post coiling.    Clinical Impression  Patient admitted with the above listed diagnosis. Patient reports independence at baseline with all functional mobility and ADLs. Patient today demonstrating R sided inattention with required support for transfers and limited mobility in room with 1 HHA. Poor safety awareness in session with required cueing for posture and to prevent falls. Due to current functional status will recommend CIR level rehab at this time to progress safe and functional mobility prior to return home. PT to follow acutely.     Follow Up Recommendations CIR    Equipment Recommendations  None recommended by PT    Recommendations for Other Services Rehab consult     Precautions / Restrictions Precautions Precautions: Fall Restrictions Weight Bearing Restrictions: No      Mobility  Bed Mobility Overal bed mobility: Needs Assistance Bed Mobility: Supine to Sit;Sit to Supine     Supine to sit: Min assist Sit to supine: Min assist   General bed mobility comments: Min A for trunk and LE management; cueing for posture and sequencing  Transfers Overall transfer level: Needs assistance Equipment used: 1 person hand held assist Transfers: Sit to/from Stand Sit to Stand: Min assist;Mod assist         General transfer comment: up to Mod A to power up from bedside and toilet - heavy forward flexion; cueing for sequencing  Ambulation/Gait Ambulation/Gait assistance: Min assist Gait Distance (Feet): 20 Feet Assistive device: 1 person hand  held assist Gait Pattern/deviations: Step-to pattern;Decreased stride length;Trunk flexed;Narrow base of support Gait velocity: decreased   General Gait Details: unsteady gait pattern with 1 HHA - mild inattention to R side with cueing for safety and sequencing throughout  Stairs            Wheelchair Mobility    Modified Rankin (Stroke Patients Only) Modified Rankin (Stroke Patients Only) Pre-Morbid Rankin Score: No symptoms Modified Rankin: Moderately severe disability     Balance Overall balance assessment: Needs assistance Sitting-balance support: No upper extremity supported;Feet supported Sitting balance-Leahy Scale: Fair     Standing balance support: Single extremity supported;During functional activity Standing balance-Leahy Scale: Poor Standing balance comment: requires 1 UE assist for support                             Pertinent Vitals/Pain Pain Assessment: No/denies pain    Home Living Family/patient expects to be discharged to:: Private residence Living Arrangements: Spouse/significant other Available Help at Discharge: Family;Available 24 hours/day Type of Home: House Home Access: Level entry     Home Layout: One level Home Equipment: None      Prior Function Level of Independence: Independent         Comments: works as an Audiological scientist   Dominant Hand: Right    Extremity/Trunk Assessment   Upper Extremity Assessment Upper Extremity Assessment: Defer to OT evaluation    Lower Extremity Assessment Lower Extremity Assessment: RLE deficits/detail;LLE deficits/detail RLE Deficits / Details: mild inattention, decreased strength LLE Deficits / Details: [facility]  Cervical / Trunk Assessment Cervical / Trunk Assessment: Kyphotic  Communication   Communication: No difficulties  Cognition Arousal/Alertness: Awake/alert Behavior During Therapy: WFL for tasks assessed/performed;Flat affect Overall  Cognitive Status: Within Functional Limits for tasks assessed                                 General Comments: slow with answering, however correct      General Comments      Exercises     Assessment/Plan    PT Assessment Patient needs continued PT services  PT Problem List Decreased strength;Decreased activity tolerance;Decreased balance;Decreased mobility;Decreased coordination;Decreased safety awareness       PT Treatment Interventions DME instruction;Gait training;Stair training;Functional mobility training;Therapeutic activities;Therapeutic exercise;Balance training;Neuromuscular re-education;Patient/family education    PT Goals (Current goals can be found in the Care Plan section)  Acute Rehab PT Goals Patient Stated Goal: none stated PT Goal Formulation: With patient Time For Goal Achievement: 04/18/20 Potential to Achieve Goals: Good    Frequency Min 4X/week   Barriers to discharge        Co-evaluation               AM-PAC PT "6 Clicks" Mobility  Outcome Measure Help needed turning from your back to your side while in a flat bed without using bedrails?: A Little Help needed moving from lying on your back to sitting on the side of a flat bed without using bedrails?: A Little Help needed moving to and from a bed to a chair (including a wheelchair)?: A Lot Help needed standing up from a chair using your arms (e.g., wheelchair or bedside chair)?: A Lot Help needed to walk in hospital room?: A Lot Help needed climbing 3-5 steps with a railing? : Total 6 Click Score: 13    End of Session Equipment Utilized During Treatment: Gait belt Activity Tolerance: Patient tolerated treatment well Patient left: in bed;with call bell/phone within reach;with bed alarm set Nurse Communication: Mobility status PT Visit Diagnosis: Unsteadiness on feet (R26.81);Other abnormalities of gait and mobility (R26.89);Muscle weakness (generalized) (M62.81)    Time:  5883-2549 PT Time Calculation (min) (ACUTE ONLY): 26 min   Charges:   PT Evaluation $PT Eval High Complexity: 1 High PT Treatments $Gait Training: 8-22 mins       Scarlette Ar, PT, DPT Supplemental Physical Therapist 04/04/20 9:11 AM Office: [phone]

## 2020-04-04 NOTE — Evaluation (Signed)
Clinical/Bedside Swallow Evaluation Patient Details  Name: Jessica [name] MRN: [id number] Date of Birth: Feb 22, 1961  Today's Date: 04/04/2020 Time: SLP Start Time (ACUTE ONLY): 1030 SLP Stop Time (ACUTE ONLY): 1041 SLP Time Calculation (min) (ACUTE ONLY): 11 min  Past Medical History:  Past Medical History:  Diagnosis Date  . High cholesterol   . Stroke [facility])    Past Surgical History:  Past Surgical History:  Procedure Laterality Date  . ABDOMINAL HYSTERECTOMY     HPI:  Pt is a 59 y.o. female with medical history significant of HLD, cerebral aneurysms status post coiling, who presented to the ED with complaint of R leg numbness that she noted on awaking at 4am the morning of admission. 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 Moderate chronic microvascular ischemic change in the white matter and pons. CT chest: Left upper lobe and right middle lobe cavitating pulmonary nodules. The left upper lobe nodule is spiculated. Malignancy is the diagnosis of exclusion. Pt passed [facility] but was then made NPO on 6/26 until formal SLP eval due to MD's "concern for aspiration".    Assessment / Plan / Recommendation Clinical Impression  Pt was seen for bedside swallow evaluation and she denied a history of dysphagia. Oral mechanism exam revealed reduced right sided facial ROM, and lingual strength. Dentition was adequate. She tolerated all solids and liquids without signs or symptoms of aspiration and her oropharyngeal swallow mechanism appeared functional despite mild oral holding of liquids. A regular texture diet with thin liquids is recommended at this this and further skilled SLP services are not clinically indicated for swallowing. SLP will follow for speech/langauge/cognition evaluation.  SLP Visit Diagnosis: Dysphagia, unspecified (R13.10)    Aspiration Risk  No limitations    Diet Recommendation Regular;Thin liquid   Liquid  Administration via: Cup;Straw Medication Administration: Whole meds with liquid Supervision: Patient able to self feed Postural Changes: Seated upright at 90 degrees    Other  Recommendations Oral Care Recommendations: Oral care BID   Follow up Recommendations None      Frequency and Duration min 2x/week          Prognosis        Swallow Study   General Date of Onset: 04/03/20 HPI: Pt is a 59 y.o. female with medical history significant of HLD, cerebral aneurysms status post coiling, who presented to the ED with complaint of R leg numbness that she noted on awaking at 4am the morning of admission. 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 Moderate chronic microvascular ischemic change in the white matter and pons. CT chest: Left upper lobe and right middle lobe cavitating pulmonary nodules. The left upper lobe nodule is spiculated. Malignancy is the diagnosis of exclusion. Pt passed [facility] but was then made NPO on 6/26 until formal SLP eval due to MD's "concern for aspiration".  Type of Study: Bedside Swallow Evaluation Previous Swallow Assessment: None Diet Prior to this Study: NPO Temperature Spikes Noted: No Respiratory Status: Nasal cannula History of Recent Intubation: No Behavior/Cognition: Alert;Cooperative;Pleasant mood Oral Cavity Assessment: Within Functional Limits Oral Care Completed by SLP: No Oral Cavity - Dentition: Dentures, top;Dentures, bottom Vision: Functional for self-feeding Self-Feeding Abilities: Able to feed self Patient Positioning: Upright in bed;Postural control adequate for testing Baseline Vocal Quality: Normal Volitional Cough: Strong Volitional Swallow: Able to elicit    Oral/Motor/Sensory Function Overall Oral Motor/Sensory Function: Mild impairment Facial  ROM: Reduced right;Suspected CN VII (facial) dysfunction Facial Symmetry: Abnormal symmetry right;Suspected CN VII (facial)  dysfunction Facial Strength: Reduced right;Suspected CN VII (facial) dysfunction Facial Sensation: Within Functional Limits Lingual ROM: Within Functional Limits Lingual Symmetry: Within Functional Limits Lingual Strength: Reduced;Suspected CN XII (hypoglossal) dysfunction Lingual Sensation: Within Functional Limits Velum: Impaired left Mandible: Within Functional Limits   Ice Chips Ice chips: Within functional limits Presentation: Spoon   Thin Liquid Thin Liquid: Within functional limits Presentation: Straw;Cup    Nectar Thick Nectar Thick Liquid: Not tested   Honey Thick Honey Thick Liquid: Not tested   Puree Puree: Within functional limits Presentation: Spoon   Solid     Solid: Within functional limits Presentation: Blakely I. Hardin Negus, [place], [place] Office number [phone] Pager [place] 04/04/2020,1:05 PM

## 2020-04-04 NOTE — Progress Notes (Signed)
Inpatient Rehab Admissions Coordinator Note:   Per therapy recommendations, pt was screened for CIR candidacy by Kindra Bickham, MS CCC-SLP. At this time, Pt. Appears to have functional decline and is a good candidate for CIR. Will place order for rehab consult per protocol.  Please contact me with questions.   Everest Hacking, MS, CCC-SLP Rehab Admissions Coordinator  [phone] (celll) [phone] (office)  

## 2020-04-04 NOTE — Progress Notes (Signed)
  Speech Language Pathology Treatment: Cognitive-Linquistic  Patient Details Name: Jessica [name] MRN: [id number] DOB: [birth year] Today's Date: 04/04/2020 Time: 1191-4782 SLP Time Calculation (min) (ACUTE ONLY): 16 min  Assessment / Plan / Recommendation Clinical Impression  Pt was seen for cognitive-linguistic treatment session with her family present. Pt and her family were educated regarding the results of the assessment, the nature of the pt's current deficits, and the plan of care. All parties verbalized understanding and agreement. Pt demonstrated 33% accuracy with problem solving related to safety increasing to 67% when verbal prompts were given. She required cues for delayed recall of information provided during the session. Pt required intermittent cueing for reasoning throughout tasks. SLP will continue to follow pt.    HPI HPI: Pt is a 59 y.o. female with medical history significant of HLD, cerebral aneurysms status post coiling, who presented to the ED with complaint of R leg numbness that she noted on awaking at 4am the morning of admission. 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 Moderate chronic microvascular ischemic change in the white matter and pons. CT chest: Left upper lobe and right middle lobe cavitating pulmonary nodules. The left upper lobe nodule is spiculated. Malignancy is the diagnosis of exclusion. Pt passed [facility] but was then made NPO on 6/26 until formal SLP eval due to MD's "concern for aspiration".       SLP Plan     Patient needs continued Speech Lanaguage Pathology Services    Recommendations  Medication Administration: Whole meds with liquid                Oral Care Recommendations: Oral care BID Follow up Recommendations: None SLP Visit Diagnosis: Cognitive communication deficit (R41.841)       Nysa Sarin I. Vear Clock, MS, CCC-SLP Acute Rehabilitation Services Office number  [phone] Pager [phone]                Scheryl Marten 04/04/2020, 1:26 PM

## 2020-04-04 NOTE — Evaluation (Signed)
Speech Language Pathology Evaluation Patient Details Name: Jessica [name] MRN: [id number] DOB: [birth year] Today's Date: 04/04/2020 Time: 6644-0347 SLP Time Calculation (min) (ACUTE ONLY): 23 min  Problem List:  Patient Active Problem List   Diagnosis Date Noted  . Right sided weakness 04/03/2020  . TIA (transient ischemic attack) 04/03/2020   Past Medical History:  Past Medical History:  Diagnosis Date  . High cholesterol   . Stroke [facility])    Past Surgical History:  Past Surgical History:  Procedure Laterality Date  . ABDOMINAL HYSTERECTOMY     HPI:  Pt is a 59 y.o. female with medical history significant of HLD, cerebral aneurysms status post coiling, who presented to the ED with complaint of R leg numbness that she noted on awaking at 4am the morning of admission. 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 Moderate chronic microvascular ischemic change in the white matter and pons. CT chest: Left upper lobe and right middle lobe cavitating pulmonary nodules. The left upper lobe nodule is spiculated. Malignancy is the diagnosis of exclusion. Pt passed [facility] but was then made NPO on 6/26 until formal SLP eval due to MD's "concern for aspiration".    Assessment / Plan / Recommendation Clinical Impression  Pt participated in speech/language/cognition evaluation with her brother and sister present for part of the evaluation. Pt denied any baseline deficits or acute changes in speech, language, or cognition. Pt indicated that she was independent prior to admission successfully managed her finances and medications. Pt's family reported that her performance was different from her baseline with significant changes in processing speed and her ability to complete cognitive-linguistic tasks. The [facility] Mental Status Examination was completed to evaluate the pt's cognitive-linguistic skills. She achieved a score of 13/30 which  is below the normal limits of 27 or more out of 30. She exhibited difficulty in the areas of awareness, attention, memory, complex problem solving, mental manipulation, and executive function. Pt required additional processing time for completion of all tasks and this additional time inconsistently facilitated accuracy. Skilled SLP services are clinically indicated at this time to improve cognitive-linguistic function.    SLP Assessment  SLP Recommendation/Assessment: Patient needs continued Speech Lanaguage Pathology Services SLP Visit Diagnosis: Cognitive communication deficit (R41.841)    Follow Up Recommendations  None    Frequency and Duration min 2x/week  2 weeks      SLP Evaluation Cognition  Overall Cognitive Status: Impaired/Different from baseline Arousal/Alertness: Awake/alert Orientation Level: Oriented X4 Attention: Focused;Sustained Focused Attention: Appears intact Sustained Attention: Impaired Sustained Attention Impairment: Verbal complex Memory: Impaired Memory Impairment: Retrieval deficit;Decreased recall of new information (Immediate: 4/5; delayed: 1/5; with cues: 4/4) Awareness: Impaired Awareness Impairment: Emergent impairment Problem Solving: Impaired Problem Solving Impairment: Verbal complex Executive Function: Reasoning;Organizing Reasoning: Impaired Reasoning Impairment: Verbal complex Organizing: Impaired Organizing Impairment: Verbal complex (Clock drawing: 0/4)       Comprehension  Auditory Comprehension Overall Auditory Comprehension: Appears within functional limits for tasks assessed Yes/No Questions: Within Functional Limits Commands: Within Functional Limits Conversation: Simple Interfering Components: Attention;Processing speed;Working Curator: Within [employer] Reading Comprehension Reading Status: Not tested    Expression Expression Primary Mode of Expression: Verbal Verbal  Expression Overall Verbal Expression: Appears within functional limits for tasks assessed Initiation: No impairment Level of Generative/Spontaneous Verbalization: Conversation Naming: No impairment Pragmatics: Impairment Impairments: Abnormal affect;Eye contact   Oral / Motor  Oral Motor/Sensory Function Overall Oral Motor/Sensory Function: Mild impairment Facial  ROM: Reduced right;Suspected CN VII (facial) dysfunction Facial Symmetry: Abnormal symmetry right;Suspected CN VII (facial) dysfunction Facial Strength: Reduced right;Suspected CN VII (facial) dysfunction Facial Sensation: Within Functional Limits Lingual ROM: Within Functional Limits Lingual Symmetry: Within Functional Limits Lingual Strength: Reduced;Suspected CN XII (hypoglossal) dysfunction Lingual Sensation: Within Functional Limits Velum: Impaired left Mandible: Within Functional Limits Motor Speech Overall Motor Speech: Appears within functional limits for tasks assessed Respiration: Within functional limits Phonation: Normal Resonance: Within functional limits Articulation: Within functional limitis Intelligibility: Intelligible Motor Planning: Witnin functional limits Motor Speech Errors: Not applicable   Sandralee Tarkington I. Vear Clock, MS, CCC-SLP Acute Rehabilitation Services Office number [phone] Pager [phone]                    Scheryl Marten 04/04/2020, 1:18 PM

## 2020-04-04 NOTE — Progress Notes (Signed)
STROKE TEAM PROGRESS NOTE   INTERVAL HISTORY No family is at the bedside.  Pt sitting in bed, psychomotor slowing, but AAO x 3. Still has left hemiparesis, left facial droop. MRI showed embolic infarcts. Etiology unclear, pending further work up.   OBJECTIVE Vitals:   04/03/20 1945 04/03/20 1946 04/03/20 2337 04/04/20 0306  BP:  (!) 155/105 (!) 153/77 (!) 162/86  Pulse:   76 78  Resp: (!) 23 (!) 21 20 (!) 24  Temp: 98 F (36.7 C) 98 F (36.7 C) 98.6 F (37 C) 98.4 F (36.9 C)  TempSrc: Oral Oral Oral Oral  SpO2:  95% 94% 97%  Weight:      Height:        CBC:  Recent Labs  Lab 04/03/20 1004  WBC 8.1  NEUTROABS 4.8  HGB 13.3  HCT 41.3  MCV 91.0  PLT 188    Basic Metabolic Panel:  Recent Labs  Lab 04/03/20 1004  NA 142  K 3.8  CL 107  CO2 24  GLUCOSE 101*  BUN 10  CREATININE 0.48  CALCIUM 8.9    Lipid Panel: No results found for: CHOL, TRIG, HDL, CHOLHDL, VLDL, LDLCALC HgbA1c:  Lab Results  Component Value Date   HGBA1C 6.0 (H) 04/04/2020   Urine Drug Screen: No results found for: LABOPIA, COCAINSCRNUR, LABBENZ, AMPHETMU, THCU, LABBARB  Alcohol Level     Component Value Date/Time   ETH <10 04/03/2020 1004    IMAGING  DG Chest 2 View 04/03/2020 IMPRESSION:  1. Low lung volumes with streaky basilar atelectasis.  2. Additional central vascular congestion, airways thickening, and few septal lines could reflect developing interstitial edema.  3. Nodular opacity in the right mid to lower lung, recommend further evaluation with CT imaging.    CT Head Wo Contrast 04/03/2020 IMPRESSION:  1.  No acute findings.  2. 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  3. Chronic ischemic microvascular disease. Possible old left cerebellar lacunar infarct as well as small old left occipital infarct.   CT CHEST WO CONTRAST 04/04/2020 IMPRESSION:  1. 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 The  left upper lobe nodule is spiculated. Malignancy is the diagnosis of exclusion. Far less likely cavitating infection or septic emboli could be considered in the appropriate setting.  2. Pathologic mediastinal and hilar adenopathy worrisome for nodal metastases.  3. Nonspecific left adrenal nodule, metastatic disease not excluded.  4. Aortic Atherosclerosis (ICD10-I70.0) and Emphysema (ICD10-J43.9).  MR Brain W and Wo Contrast MR ANGIO HEAD WO CONTRAST MR ANGIO NECK WO CONTRAST 04/03/2020 IMPRESSION:  1. Multiple areas of acute and subacute infarct in the brain bilaterally suggestive of emboli.  2. Moderate chronic microvascular ischemic change in the white matter and pons.  3. Occlusion left A2 segment and mild stenosis right A2 segment. Hypoplastic right A1 segment.  4. Mild stenosis right P2 segment. Middle cerebral arteries patent bilaterally.    Transthoracic Echocardiogram  00/00/2021 Pending  ECG - SR rate 73 BPM. (See cardiology reading for complete details)   PHYSICAL EXAM  Temp:  [98 F (36.7 C)-98.6 F (37 C)] 98.2 F (36.8 C) (06/27 0717) Pulse Rate:  [73-90] 73 (06/27 0717) Resp:  [16-24] 20 (06/27 0717) BP: (152-172)/(68-105) 152/68 (06/27 0717) SpO2:  [91 %-97 %] 95 % (06/27 0717)  General - Well nourished, well developed, in no apparent distress.  Ophthalmologic - fundi not visualized due to noncooperation.  Cardiovascular - Regular rhythm and rate, frequent  PVCs on tele.  Mental Status -  Level of arousal and orientation to time, place, and person were intact. Language including expression, naming, repetition, comprehension was assessed and found intact. However, significant psychmotor slowing  Cranial Nerves II - XII - II - Visual field intact OU. III, IV, VI - Extraocular movements intact. V - Facial sensation intact bilaterally. VII - right facial droop. VIII - Hearing & vestibular intact bilaterally. X - Palate elevates symmetrically. XI - Chin  turning & shoulder shrug intact bilaterally. XII - Tongue protrusion intact.  Motor Strength - The patient's strength was normal in left UE and LE, however, right UE and LE 4/5 with mild pronator drift.  Bulk was normal and fasciculations were absent.   Motor Tone - Muscle tone was assessed at the neck and appendages and was normal.  Reflexes - The patient's reflexes were symmetrical in all extremities and she had no pathological reflexes.  Sensory - Light touch, temperature/pinprick were assessed and were symmetrical.    Coordination - The patient had normal movements in the hands with no ataxia or dysmetria, although slow action, more so on the right.  Tremor was absent.  Gait and Station - deferred.   ASSESSMENT/PLAN Ms. Jessica [name] is a 59 y.o. female with history of ongoing tobacco use, cerebral aneurysms (coiled), high cholesterol and previous stroke who presents with right-sided numbness / weakness which mostly resolved and tremor of right arm and leg.  She did not receive IV t-PA due to late presentation.  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 hypercoagulable due to malignancy  CT head -  No acute findings. 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 Chronic ischemic microvascular disease. Possible old left cerebellar lacunar infarct as well as small old left occipital infarct.   MRI head - Multiple areas of acute and subacute infarct in the brain bilaterally suggestive of systemic emboli.   MRA H&N - Occlusion left A2 segment and mild stenosis right A2 segment. Hypoplastic right A1 segment.   2D Echo - pending  LE venous doppler - pending   May consider TEE and/or loop if embolic work up negative.  Sars Corona Virus 2 - negative  LDL - 82  HgbA1c - 6.0  UDS - neg  Hypercoagulable work up pending  VTE prophylaxis - SCDs  aspirin 81 mg daily prior to admission, now on  aspirin 81 mg daily and clopidogrel 75 mg daily DAPT. OK to hold off plavix if biopsy needed.  Patient counseled to be compliant with her antithrombotic medications  Ongoing aggressive stroke risk factor management  Therapy recommendations:  CIR  Disposition:  Pending  ? Malignancy   Chest CT -  Left upper lobe and right middle lobe cavitating pulmonary nodules. Pathologic mediastinal and hilar adenopathy worrisome for nodal metastases. Nonspecific left adrenal nodule, metastatic disease not excluded.  Pt denies discomfort or weight loss  Recommend oncology consult for further evaluation of malignancy  OK to hold off plavix if biopsy needed  LE venous doppler pending  Hypertension  Home BP meds: none   Current BP meds: none   BP stable . Permissive hypertension (OK if < 220/120) but gradually normalize in 2-3 days  . Long-term BP goal normotensive  Hyperlipidemia  Home Lipid lowering medication: Lipitor 80 mg daily  LDL 82, goal < 70  Current lipid lowering medication: lipitor 80   Continue statin at discharge  Tobacco abuse  Current smoker  Smoking cessation counseling provided  Pt is willing to quit  Other Stroke Risk Factors  Advanced age  Hx stroke/TIA on imaging  Other Active Problems  Code status - Full code  Hospital day # 1  Rosalin Hawking, MD PhD Stroke Neurology 04/04/2020 1:34 PM   To contact Stroke Continuity provider, please refer to [email or web address] After hours, contact General Neurology

## 2020-04-05 DIAGNOSIS — R590 Localized enlarged lymph nodes: Secondary | ICD-10-CM

## 2020-04-05 DIAGNOSIS — R531 Weakness: Secondary | ICD-10-CM

## 2020-04-05 DIAGNOSIS — G459 Transient cerebral ischemic attack, unspecified: Secondary | ICD-10-CM

## 2020-04-05 LAB — MPO/PR-3 (ANCA) ANTIBODIES
ANCA Proteinase 3: <3.5 U/mL (ref 0.0–3.5)
Myeloperoxidase Abs: <9 U/mL (ref 0.0–9.0)

## 2020-04-05 LAB — HOMOCYSTEINE: Homocysteine: 15.7 umol/L (ref 0.0–17.2)

## 2020-04-05 LAB — SJOGRENS SYNDROME-A EXTRACTABLE NUCLEAR ANTIBODY: SSA (Ro) (ENA) Antibody, IgG: <0.2 AI (ref 0.0–0.9)

## 2020-04-05 LAB — GLOMERULAR BASEMENT MEMBRANE ANTIBODIES: GBM Ab: 2 units (ref 0–20)

## 2020-04-05 LAB — ANTI-SCLERODERMA ANTIBODY: Scleroderma (Scl-70) (ENA) Antibody, IgG: <0.2 AI (ref 0.0–0.9)

## 2020-04-05 LAB — SJOGRENS SYNDROME-B EXTRACTABLE NUCLEAR ANTIBODY: SSB (La) (ENA) Antibody, IgG: <0.2 AI (ref 0.0–0.9)

## 2020-04-05 LAB — RHEUMATOID FACTOR: Rheumatoid fact SerPl-aCnc: <10 IU/mL (ref 0.0–13.9)

## 2020-04-05 NOTE — Progress Notes (Signed)
Physical Therapy Treatment Patient Details Name: Jessica [name] MRN: [id number] DOB: Nov 23, 1960 Today's Date: 04/05/2020    History of Present Illness Pt is a 59 y/o female presenting to the ED on 04/03/20 with primary complaints of R sided weakness. 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 Patient admitted for further work-up. PMH of HLD , cerebral aneurysms status post coiling.    PT Comments    Patient seen for mobility progression. Pt requires min-mod A for OOB mobility due to impaired balance, R side inattention, and decreased awareness of safety/defiticts.  Continue to recommend CIR for further skilled PT services to maximize independence and safety with mobility.    Follow Up Recommendations  CIR     Equipment Recommendations  None recommended by PT    Recommendations for Other Services Rehab consult     Precautions / Restrictions Precautions Precautions: Fall Restrictions Weight Bearing Restrictions: No    Mobility  Bed Mobility Overal bed mobility: Needs Assistance Bed Mobility: Supine to Sit;Sit to Supine     Supine to sit: Supervision Sit to supine: Supervision   General bed mobility comments: Supervision for safety; increased time and effort to get EOB from supine   Transfers Overall transfer level: Needs assistance Equipment used: None;1 person hand held assist Transfers: Sit to/from Stand Sit to Stand: Min guard;Mod assist         General transfer comment: min guard to stand from EOB and mod A to stand from commode due to posterior bias; cues for hand placement  Ambulation/Gait Ambulation/Gait assistance: Min assist;Mod assist Gait Distance (Feet): 150 Feet Assistive device: 1 person hand held assist;None (assist at trunk with gait belt) Gait Pattern/deviations: Step-through pattern;Decreased stride length;Staggering right;Staggering left;Narrow base of support Gait velocity:  decreased   General Gait Details: assistance required for balance and weight shifting; pt with multiple lateral LOB and near scissoring; cues to navigate environment as pt tending to nearly run into objects on L and R side    Stairs             Wheelchair Mobility    Modified Rankin (Stroke Patients Only)       Balance Overall balance assessment: Needs assistance Sitting-balance support: No upper extremity supported;Feet supported Sitting balance-Leahy Scale: Fair     Standing balance support: During functional activity;No upper extremity supported Standing balance-Leahy Scale: Fair                              Cognition Arousal/Alertness: Awake/alert Behavior During Therapy: Flat affect Overall Cognitive Status: Impaired/Different from baseline Area of Impairment: Following commands;Safety/judgement;Problem solving                       Following Commands: Follows one step commands consistently Safety/Judgement: Decreased awareness of safety;Decreased awareness of deficits   Problem Solving: Decreased initiation;Slow processing;Requires verbal cues General Comments: slow processing and decreased safety awareness       Exercises      General Comments        Pertinent Vitals/Pain Pain Assessment: No/denies pain    Home Living                      Prior Function            PT Goals (current goals can now be found in the care plan section) Acute Rehab PT Goals Patient Stated  Goal: none stated Progress towards PT goals: Progressing toward goals    Frequency    Min 4X/week      PT Plan Current plan remains appropriate    Co-evaluation              AM-PAC PT "6 Clicks" Mobility   Outcome Measure  Help needed turning from your back to your side while in a flat bed without using bedrails?: A Little Help needed moving from lying on your back to sitting on the side of a flat bed without using bedrails?: A  Little Help needed moving to and from a bed to a chair (including a wheelchair)?: A Lot Help needed standing up from a chair using your arms (e.g., wheelchair or bedside chair)?: A Lot Help needed to walk in hospital room?: A Lot Help needed climbing 3-5 steps with a railing? : Total 6 Click Score: 13    End of Session Equipment Utilized During Treatment: Gait belt Activity Tolerance: Patient tolerated treatment well Patient left: in bed;with call bell/phone within reach;with bed alarm set Nurse Communication: Mobility status PT Visit Diagnosis: Unsteadiness on feet (R26.81);Other abnormalities of gait and mobility (R26.89);Muscle weakness (generalized) (M62.81)     Time: 1443-1540 PT Time Calculation (min) (ACUTE ONLY): 23 min  Charges:  $Gait Training: 23-37 mins                     Earney Navy, PTA Acute Rehabilitation Services Pager: [phone] Office: [phone]     Darliss Cheney 04/05/2020, 5:24 PM

## 2020-04-05 NOTE — Progress Notes (Signed)
Inpatient Rehab Admissions Coordinator:   Met with patient at bed to let her know that I do not have a CIR bed available but I have opened a case with her insurance. Pt. Is still pending cardiac work up and is not yet medically ready for CIR admit.   Clemens Catholic, [place], [place] Admissions Coordinator  [phone] ([place]) [phone] (office)

## 2020-04-05 NOTE — Evaluation (Signed)
Occupational Therapy Evaluation Patient Details Name: Jessica [name] MRN: [id number] DOB: [birth year] Today's Date: 04/05/2020    History of Present Illness Pt is a 59 y/o female presenting to the ED on 04/03/20 with primary complaints of R sided weakness. 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 Patient admitted for further work-up. PMH of HLD , cerebral aneurysms status post coiling.   Clinical Impression   PTA pt reports being independent with all ADLs and IADLs, working and driving. Pt was admitted for above and treated for problem list below. Pt alert and oriented but has very slow processing, poor attention, awareness and safety awareness. Requires Min A - Supervision with verbal cues for ADLs due to decreased problem solving, strength, and coordination with the R UE. Requires Min A to [employer] with transfers and ambulation due to decreased balance and weakness. Vision and perception to be evaluated next session due to suspicion of R inattention. Pt relied on external supports to walk initially. Believe pt would benefit from skilled OT services acutely and at the CIR level to increase safety and independence in ADLs and IADLs.    Follow Up Recommendations  CIR    Equipment Recommendations   (TBD at next venue of care)    Recommendations for Other Services Rehab consult     Precautions / Restrictions Precautions Precautions: Fall Restrictions Weight Bearing Restrictions: No      Mobility Bed Mobility Overal bed mobility: Needs Assistance Bed Mobility: Supine to Sit;Sit to Supine     Supine to sit: Supervision Sit to supine: Supervision   General bed mobility comments: Supervision for safety. Verbal cues needed to bring pt to EOB  Transfers Overall transfer level: Needs assistance Equipment used:  (relying on external supports initially) Transfers: Sit to/from Stand Sit to Stand: Min Assist; Min guard          General transfer comment: Min Assist to Min guard for safety while ambulating due to weakness and deficits with balance. Relying on external supports initially (bed rail and counter at sink) while getting up and ambulating.    Balance Overall balance assessment: Needs assistance Sitting-balance support: No upper extremity supported;Feet supported Sitting balance-Leahy Scale: Fair     Standing balance support: During functional activity;No upper extremity supported Standing balance-Leahy Scale: Fair                             ADL either performed or assessed with clinical judgement   ADL Overall ADL's : Needs assistance/impaired     Grooming: Oral care;Standing;Min guard Grooming Details (indicate cue type and reason): Min guard for safety due to generalized weakness Upper Body Bathing: Sitting;Supervision/ safety Upper Body Bathing Details (indicate cue type and reason): supervision for safety Lower Body Bathing: Minimal assistance;Sitting/lateral leans Lower Body Bathing Details (indicate cue type and reason): Min A for LOB and lean to L with reaching for feet Upper Body Dressing : Supervision/safety;Sitting Upper Body Dressing Details (indicate cue type and reason): Supervision for safety Lower Body Dressing: Minimal assistance;Sitting/lateral leans Lower Body Dressing Details (indicate cue type and reason): Min A for LOB and lean to L with donning socks Toilet Transfer: Min guard;Cueing for safety;Ambulation Toilet Transfer Details (indicate cue type and reason): Min guard for safety simulated to reclienr Toileting- Architect and Hygiene: Min guard;Sit to/from stand Toileting - Clothing Manipulation Details (indicate cue type and reason): Min guard for safety  Functional mobility during ADLs: Minimal assistance;Supervision/safety;Cueing for safety General ADL Comments: Min A - Supervision for safety with ADLs due to lack of cognition, decreased  coordination/strength with R UE, and deficits in balance     Vision Baseline Vision/History: Wears glasses Wears Glasses: At all times Patient Visual Report: No change from baseline Vision Assessment?: Yes Eye Alignment: Within Functional Limits Ocular Range of Motion: Within Functional Limits Alignment/Gaze Preference: Within Defined Limits Tracking/Visual Pursuits: Decreased smoothness of eye movement to RIGHT inferior field;Impaired - to be further tested in functional context Saccades: Within functional limits Additional Comments: Vision need to be futher assessed due to suspected inattention     Perception Perception Perception Tested?: No   Praxis Praxis Praxis tested?: Not tested    Pertinent Vitals/Pain Pain Assessment: No/denies pain     Hand Dominance Right   Extremity/Trunk Assessment Upper Extremity Assessment Upper Extremity Assessment: Generalized weakness;RUE deficits/detail RUE Deficits / Details: suspectful of inattention of R side. Weakness 4/5 in R shoulder and grip strength compared to 5/5 in L UE. Pt able to open toothpaste and brush teeth with R hand.   RUE Sensation: WNL   Lower Extremity Assessment Lower Extremity Assessment: Defer to PT evaluation   Cervical / Trunk Assessment Cervical / Trunk Assessment: Kyphotic   Communication Communication Communication: Expressive difficulties (slow speaking/processing)   Cognition Arousal/Alertness: Awake/alert Behavior During Therapy: WFL for tasks assessed/performed;Flat affect Overall Cognitive Status: Impaired/Different from baseline Area of Impairment: Attention;Following commands;Safety/judgement;Awareness;Problem solving                   Current Attention Level: Sustained   Following Commands: Follows one step commands consistently Safety/Judgement: Decreased awareness of safety;Decreased awareness of deficits Awareness: Emergent Problem Solving: Decreased initiation;Slow  processing;Requires verbal cues General Comments: very slow processing, but appropriate with replies and oriented.    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        OT Problem List: Decreased strength;Decreased range of motion;Impaired balance (sitting and/or standing);Impaired vision/perception;Decreased coordination;Decreased cognition;Decreased safety awareness;Decreased knowledge of use of DME or AE;Decreased knowledge of precautions;Impaired UE functional use      OT Treatment/Interventions: Self-care/ADL training;Therapeutic exercise;Neuromuscular education;DME and/or AE instruction;Therapeutic activities;Cognitive remediation/compensation;Visual/perceptual remediation/compensation;Patient/family education;Balance training    OT Goals(Current goals can be found in the care plan section) Acute Rehab OT Goals Patient Stated Goal: none stated Time For Goal Achievement: 04/19/20 Potential to Achieve Goals: Good  OT Frequency: Min 2X/week              AM-PAC OT "6 Clicks" Daily Activity     Outcome Measure Help from another person eating meals?: A Little Help from another person taking care of personal grooming?: A Little Help from another person toileting, which includes using toliet, bedpan, or urinal?: A Little Help from another person bathing (including washing, rinsing, drying)?: A Little Help from another person to put  on and  taking off regular upper body clothing?: A Little Help from another person to put on and taking off regular lower body clothing?: A Little 6 Click Score: 18   End of Session Equipment Utilized During Treatment: Gait belt Nurse Communication: Mobility status  Activity Tolerance: Patient tolerated treatment well Patient left: in chair;with call bell/phone within reach;with chair alarm set;with family/visitor present  OT Visit Diagnosis: Unsteadiness on feet (R26.81);Other abnormalities of gait and mobility (R26.89);Muscle weakness (generalized) (M62.81);Low vision, both eyes (H54.2);Other symptoms and signs involving the nervous system (R29.898);Other symptoms and signs involving cognitive function                Time: [id number] OT Time Calculation (min): 30 min Charges:  OT General Charges $OT Visit: 1 Visit OT Evaluation $OT Eval Moderate Complexity: 1 Mod OT Treatments $Self Care/Home Management : 8-22 mins  Lupita Rosales/OTS  Mikeya Tomasetti 04/05/2020, 1:29 PM

## 2020-04-05 NOTE — Consult Note (Signed)
Physical Medicine and Rehabilitation Consult Reason for Consult: Right side weakness Referring Physician: [facility]   HPI: Jessica Jessica [name] is a 59 y.o. 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  Patient lives with spouse.  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  1 level home.  Presented 04/03/2020 with right leg weakness and numbness.  CT/MRI as well as MRA head and neck showed multiple areas of acute and subacute infarct in the brain bilaterally suggestive of emboli.  Moderate chronic microvascular ischemic change in the white matter and pons.  Occlusion left A2 segment and mild stenosis right A2 segment.  Patient did not receive TPA.  Echo with ejection fraction of 65% grade 1 diastolic dysfunction no regional wall motion abnormalities.  Chest CT showed left upper lobe and right middle lobe cavitating pulmonary nodule, pathologic mediastinal and hilar adenopathy worrisome for nodal metastasis admission chemistries unremarkable, BNP 144, urinalysis negative nitrite.  Currently maintained on aspirin and Plavix therapy for CVA prophylaxis.  Awaiting plan for possible need for TEE.  Oncology services to follow-up in regards to pulmonary nodules question plan need for biopsy.  Therapy evaluations completed with recommendations of physical medicine rehab consult   Review of Systems  Constitutional: Negative for chills and fever.  HENT: Negative for hearing loss.   Eyes: Negative for blurred vision and double vision.  Respiratory: Negative for cough and shortness of breath.   Cardiovascular: Negative for chest pain, palpitations and leg swelling.  Gastrointestinal: Positive for constipation. Negative for heartburn, nausea and vomiting.  Genitourinary: Negative for dysuria, flank pain and hematuria.  Musculoskeletal: Positive for joint pain and myalgias.  Skin: Negative for rash.  Neurological: Positive for  sensory change and weakness.  All other systems reviewed and are negative.  Past Medical History:  Diagnosis Date  . High cholesterol   . Stroke [facility])    Past Surgical History:  Procedure Laterality Date  . ABDOMINAL HYSTERECTOMY     No family history on file. Social History:  reports that she has been smoking cigarettes. She has never used smokeless tobacco. She reports that she does not drink alcohol and does not use drugs. Allergies: No Known Allergies Medications Prior to Admission  Medication Sig Dispense Refill  . aspirin EC 81 MG tablet Take 81 mg by mouth daily. Swallow whole.    Marland Kitchen atorvastatin (LIPITOR) 80 MG tablet Take 80 mg by mouth at bedtime.     Marland Kitchen oxyCODONE-acetaminophen (PERCOCET/ROXICET) 5-325 MG per tablet Take 1-2 tablets by mouth every 6 (six) hours as needed for pain. (Patient not taking: Reported on 04/03/2020) 15 tablet 0    Home: Home Living Family/patient expects to be discharged to:: Private residence Living Arrangements: Spouse/significant other Available Help at Discharge: Family, Available 24 hours/day Type of Home: House Home Access: Level entry Home Layout: One level Bathroom Shower/Tub: Engineer, manufacturing systems: Standard Home Equipment: None  Lives With: Significant other  Functional History: Prior Function Level of Independence: Independent Comments: works as an Database administrator Status:  Mobility: Bed Mobility Overal bed mobility: Needs Assistance Bed Mobility: Supine to Sit, Sit to Supine Supine to sit: Min assist Sit to supine: Min assist Jessica [name] bed mobility comments: Min A for trunk and LE management; cueing for posture and sequencing Transfers Overall transfer level: Needs assistance Equipment used: 1 person hand held assist Transfers: Sit to/from Stand Sit to Stand: Min assist, Mod assist Jessica [name] transfer comment: up to  Mod A to power up from bedside and toilet - heavy forward flexion; cueing for  sequencing Ambulation/Gait Ambulation/Gait assistance: Min assist Gait Distance (Feet): 20 Feet Assistive device: 1 person hand held assist Gait Pattern/deviations: Step-to pattern, Decreased stride length, Trunk flexed, Narrow base of support Jessica [name] Gait Details: unsteady gait pattern with 1 HHA - mild inattention to R side with cueing for safety and sequencing throughout Gait velocity: decreased    ADL:    Cognition: Cognition Overall Cognitive Status: Impaired/Different from baseline Arousal/Alertness: Awake/alert Orientation Level: Oriented X4 Attention: Focused, Sustained Focused Attention: Appears intact Sustained Attention: Impaired Sustained Attention Impairment: Verbal complex Memory: Impaired Memory Impairment: Retrieval deficit, Decreased recall of new information (Immediate: 4/5; delayed: 1/5; with cues: 4/4) Awareness: Impaired Awareness Impairment: Emergent impairment Problem Solving: Impaired Problem Solving Impairment: Verbal complex Executive Function: Reasoning, Organizing Reasoning: Impaired Reasoning Impairment: Verbal complex Organizing: Impaired Organizing Impairment: Verbal complex (Clock drawing: 0/4) Cognition Arousal/Alertness: Awake/alert Behavior During Therapy: WFL for tasks assessed/performed, Flat affect Overall Cognitive Status: Impaired/Different from baseline Jessica [name] Comments: slow with answering, however correct  Blood pressure (!) 145/80, pulse 72, temperature 98.2 F (36.8 C), temperature source Oral, resp. rate 19, height 5\' 5"  (1.651 m), weight 73.8 kg, SpO2 92 %.  Jessica [name]: Alert and oriented x 3, No apparent distress HEENT: Head is normocephalic, atraumatic, PERRLA, EOMI, sclera anicteric, oral mucosa pink and moist, dentition intact, ext ear canals clear,  Neck: Supple without JVD or lymphadenopathy Heart: Reg rate and rhythm. No murmurs rubs or gallops Chest: CTA bilaterally without wheezes, rales, or rhonchi; no  distress Abdomen: Soft, non-tender, non-distended, bowel sounds positive. Extremities: No clubbing, cyanosis, or edema. Pulses are 2+ Skin: Clean and intact without signs of breakdown Neuro: Pt is cognitively appropriate with normal insight, memory, and awareness, but with psychomotor slowing. Cranial nerves 2-12 are intact with the exception of right sided facial droop. Sensory exam is normal. Reflexes are 2+ in all 4's. Fine motor coordination is intact. No tremors. Motor function is grossly 5/5 on left side, 4/5 on right side.  Psych: Pt's affect is appropriate. Pt is cooperative   Results for orders placed or performed during the hospital encounter of 04/03/20 (from the past 24 hour(s))  Urinalysis, Complete w Microscopic     Status: Abnormal   Collection Time: 04/04/20  6:44 AM  Result Value Ref Range   Color, Urine YELLOW YELLOW   APPearance CLEAR CLEAR   Specific Gravity, Urine 1.012 1.005 - 1.030   pH 6.0 5.0 - 8.0   Glucose, UA NEGATIVE NEGATIVE mg/dL   Hgb urine dipstick LARGE (A) NEGATIVE   Bilirubin Urine NEGATIVE NEGATIVE   Ketones, ur NEGATIVE NEGATIVE mg/dL   Protein, ur NEGATIVE NEGATIVE mg/dL   Nitrite NEGATIVE NEGATIVE   Leukocytes,Ua NEGATIVE NEGATIVE   RBC / HPF >50 (H) 0 - 5 RBC/hpf   WBC, UA 0-5 0 - 5 WBC/hpf   Bacteria, UA RARE (A) NONE SEEN   Squamous Epithelial / LPF 0-5 0 - 5   Mucus PRESENT   C-reactive protein     Status: None   Collection Time: 04/04/20  7:33 AM  Result Value Ref Range   CRP 0.5 <1.0 mg/dL  Sedimentation rate     Status: None   Collection Time: 04/04/20  7:33 AM  Result Value Ref Range   Sed Rate 6 0 - 22 mm/hr  Vitamin B12     Status: None   Collection Time: 04/04/20  7:33 AM  Result Value Ref Range  Vitamin B-12 407 180 - 914 pg/mL  RPR     Status: None   Collection Time: 04/04/20  7:33 AM  Result Value Ref Range   RPR Ser Ql NON REACTIVE NON REACTIVE  TSH     Status: None   Collection Time: 04/04/20  7:33 AM  Result Value  Ref Range   TSH 1.083 0.350 - 4.500 uIU/mL  Rapid urine drug screen (hospital performed)     Status: None   Collection Time: 04/04/20  8:47 AM  Result Value Ref Range   Opiates NONE DETECTED NONE DETECTED   Cocaine NONE DETECTED NONE DETECTED   Benzodiazepines NONE DETECTED NONE DETECTED   Amphetamines NONE DETECTED NONE DETECTED   Tetrahydrocannabinol NONE DETECTED NONE DETECTED   Barbiturates NONE DETECTED NONE DETECTED   DG Chest 2 View  Result Date: 04/03/2020 CLINICAL DATA:  TIA EXAM: CHEST - 2 VIEW COMPARISON:  Radiograph 01/20/2017 FINDINGS: Low lung volumes with streaky basilar areas of atelectasis. A more nodular masslike opacity is present in the right mid to lower lung. There is mild central vascular congestion and central airways thickening. No pneumothorax or visible effusion. The aorta is calcified. The remaining cardiomediastinal contours are unremarkable. No acute osseous or soft tissue abnormality. IMPRESSION: 1. Low lung volumes with streaky basilar atelectasis. 2. Additional central vascular congestion, airways thickening, and few septal lines could reflect developing interstitial edema. 3. Nodular opacity in the right mid to lower lung, recommend further evaluation with CT imaging. Electronically Signed   By: Kreg Shropshire M.D.   On: 04/03/2020 21:36   CT Head Wo Contrast  Result Date: 04/03/2020 CLINICAL DATA:  Right-sided weakness/numbness which has resolved. Previous history of coil embolization of intracerebral aneurysms. EXAM: CT HEAD WITHOUT CONTRAST TECHNIQUE: Contiguous axial images were obtained from the base of the skull through the vertex without intravenous contrast. COMPARISON:  01/20/2017 FINDINGS: Brain: Ventricles, cisterns and other CSF spaces are within normal. There is evidence of chronic ischemic microvascular disease. There is a small old left occipital infarct. Possible old lacunar infarct over the left cerebellum. Moderate streak artifact from patient's  previous aneurysm coiling involving bilateral posterior communicating arteries as well as region of the anterior communicating artery. No evidence of mass, mass effect or acute hemorrhage. No evidence of acute infarction. Vascular: Embolization coils as described. Skull: Normal. Negative for fracture or focal lesion. Sinuses/Orbits: No acute finding. Other: None. IMPRESSION: 1.  No acute findings. 2. 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 3. Chronic ischemic microvascular disease. Possible old left cerebellar lacunar infarct as well as small old left occipital infarct. Electronically Signed   By: Elberta Fortis M.D.   On: 04/03/2020 10:54   CT CHEST WO CONTRAST  Result Date: 04/04/2020 CLINICAL DATA:  Shortness of breath, right lung nodularity on chest x-ray EXAM: CT CHEST WITHOUT CONTRAST TECHNIQUE: Multidetector CT imaging of the chest was performed following the standard protocol without IV contrast. COMPARISON:  04/03/2020 FINDINGS: Cardiovascular: Unenhanced imaging of the heart and great vessels demonstrates no pericardial effusion. Mild atherosclerosis of the thoracic aorta. Mediastinum/Nodes: Pathologic mediastinal adenopathy is identified. Largest lymph node in the AP window measures 13 mm in short axis, reference image 52/3. Precarinal adenopathy measures up to 14 mm in short axis, reference image 56/3. Subcarinal and hilar adenopathy are also identified, more difficult to measure given lack of IV contrast. The thyroid, trachea, and esophagus are grossly unremarkable. Lungs/Pleura: Spiculated left upper lobe mass is identified with peripheral cavitation, measuring  up to 3.3 x 2.2 cm reference image 48/4. There is a second cavitating nodule within the lateral segment right middle lobe measuring 2.0 x 1.9 cm reference image 98/4. This corresponds to chest x-ray finding. There is background emphysema. No effusion or pneumothorax. Dependent hypoventilatory  changes are noted. Upper Abdomen: Indeterminate 10 mm nodule within the left adrenal gland is noted. The remainder of the upper abdomen is unremarkable. Musculoskeletal: No acute or destructive bony lesions. Reconstructed images demonstrate no additional findings. IMPRESSION: 1. 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 The left upper lobe nodule is spiculated. Malignancy is the diagnosis of exclusion. Far less likely cavitating infection or septic emboli could be considered in the appropriate setting. 2. Pathologic mediastinal and hilar adenopathy worrisome for nodal metastases. 3. Nonspecific left adrenal nodule, metastatic disease not excluded. 4. Aortic Atherosclerosis (ICD10-I70.0) and Emphysema (ICD10-J43.9). These results will be called to the ordering clinician or representative by the Radiologist Assistant, and communication documented in the PACS or [employer]. Electronically Signed   By: Sharlet Salina M.D.   On: 04/04/2020 01:41   MR ANGIO HEAD WO CONTRAST  Result Date: 04/03/2020 CLINICAL DATA:  TIA. Right-sided numbness and weakness. History of aneurysm coiling. EXAM: MRI HEAD WITHOUT AND WITH CONTRAST MRA HEAD WITHOUT   CONTRAST MRA NECK WITHOUT   CONTRAST TECHNIQUE: Multiplanar, multiecho pulse sequences of the brain and surrounding structures were obtained without and with intravenous contrast. Angiographic images of the head were obtained using MRA technique without contrast. Angiographic sequences of the neck and surrounding structures were obtained without intravenous contrast. CONTRAST:  7.19mL GADAVIST GADOBUTROL 1 MMOL/ML IV SOLN COMPARISON:  None. FINDINGS: MRI HEAD FINDINGS Brain: Multiple areas of recent infarction bilaterally. Acute/subacute infarct in the left frontal lobe over the convexity involving cortex and white matter. Cortical enhancement over the left frontal convexity without restricted diffusion suggesting late subacute infarct. Small area of  acute or subacute infarct right medial parietal lobe. Subacute infarct with mild enhancement in the left parietal lobe. Cluster of small acute infarcts in the right posterior cerebellum with mild enhancement. Moderate chronic microvascular ischemic change in the white matter and pons. Mild atrophy without hydrocephalus. No intracranial hemorrhage or mass. Vascular: Normal arterial flow voids. Skull and upper cervical spine: No focal skeletal lesion. Sinuses/Orbits: Paranasal sinuses clear.  Normal orbit Other: None MRA HEAD FINDINGS Both vertebral arteries patent to the basilar. Left PICA patent. Right PICA not visualized but there is a right AICA which may contribute to this territory. Basilar widely patent. Fetal origin right posterior cerebral artery. Mild stenosis right P2 segment. Left posterior cerebral artery widely patent. Internal carotid artery patent bilaterally without stenosis. Hypoplastic right A1 segment which is small. Left A1 widely patent. Occlusion left A2 segment. Mild stenosis right A2 segment. Middle cerebral arteries patent bilaterally without significant stenosis. MRA NECK FINDINGS Antegrade flow in carotid and vertebral arteries bilaterally. Carotid bifurcation patent bilaterally without stenosis. Both vertebral arteries are patent without stenosis. IMPRESSION: 1. Multiple areas of acute and subacute infarct in the brain bilaterally suggestive of emboli. 2. Moderate chronic microvascular ischemic change in the white matter and pons. 3. Occlusion left A2 segment and mild stenosis right A2 segment. Hypoplastic right A1 segment. 4. Mild stenosis right P2 segment. Middle cerebral arteries patent bilaterally. Electronically Signed   By: Marlan Palau M.D.   On: 04/03/2020 14:38   MR ANGIO NECK WO CONTRAST  Result Date: 04/03/2020 CLINICAL DATA:  TIA. Right-sided numbness and weakness. History of  aneurysm coiling. EXAM: MRI HEAD WITHOUT AND WITH CONTRAST MRA HEAD WITHOUT   CONTRAST MRA NECK  WITHOUT   CONTRAST TECHNIQUE: Multiplanar, multiecho pulse sequences of the brain and surrounding structures were obtained without and with intravenous contrast. Angiographic images of the head were obtained using MRA technique without contrast. Angiographic sequences of the neck and surrounding structures were obtained without intravenous contrast. CONTRAST:  7.30mL GADAVIST GADOBUTROL 1 MMOL/ML IV SOLN COMPARISON:  None. FINDINGS: MRI HEAD FINDINGS Brain: Multiple areas of recent infarction bilaterally. Acute/subacute infarct in the left frontal lobe over the convexity involving cortex and white matter. Cortical enhancement over the left frontal convexity without restricted diffusion suggesting late subacute infarct. Small area of acute or subacute infarct right medial parietal lobe. Subacute infarct with mild enhancement in the left parietal lobe. Cluster of small acute infarcts in the right posterior cerebellum with mild enhancement. Moderate chronic microvascular ischemic change in the white matter and pons. Mild atrophy without hydrocephalus. No intracranial hemorrhage or mass. Vascular: Normal arterial flow voids. Skull and upper cervical spine: No focal skeletal lesion. Sinuses/Orbits: Paranasal sinuses clear.  Normal orbit Other: None MRA HEAD FINDINGS Both vertebral arteries patent to the basilar. Left PICA patent. Right PICA not visualized but there is a right AICA which may contribute to this territory. Basilar widely patent. Fetal origin right posterior cerebral artery. Mild stenosis right P2 segment. Left posterior cerebral artery widely patent. Internal carotid artery patent bilaterally without stenosis. Hypoplastic right A1 segment which is small. Left A1 widely patent. Occlusion left A2 segment. Mild stenosis right A2 segment. Middle cerebral arteries patent bilaterally without significant stenosis. MRA NECK FINDINGS Antegrade flow in carotid and vertebral arteries bilaterally. Carotid bifurcation  patent bilaterally without stenosis. Both vertebral arteries are patent without stenosis. IMPRESSION: 1. Multiple areas of acute and subacute infarct in the brain bilaterally suggestive of emboli. 2. Moderate chronic microvascular ischemic change in the white matter and pons. 3. Occlusion left A2 segment and mild stenosis right A2 segment. Hypoplastic right A1 segment. 4. Mild stenosis right P2 segment. Middle cerebral arteries patent bilaterally. Electronically Signed   By: Marlan Palau M.D.   On: 04/03/2020 14:38   MR Brain W and Wo Contrast  Result Date: 04/03/2020 CLINICAL DATA:  TIA. Right-sided numbness and weakness. History of aneurysm coiling. EXAM: MRI HEAD WITHOUT AND WITH CONTRAST MRA HEAD WITHOUT   CONTRAST MRA NECK WITHOUT   CONTRAST TECHNIQUE: Multiplanar, multiecho pulse sequences of the brain and surrounding structures were obtained without and with intravenous contrast. Angiographic images of the head were obtained using MRA technique without contrast. Angiographic sequences of the neck and surrounding structures were obtained without intravenous contrast. CONTRAST:  7.68mL GADAVIST GADOBUTROL 1 MMOL/ML IV SOLN COMPARISON:  None. FINDINGS: MRI HEAD FINDINGS Brain: Multiple areas of recent infarction bilaterally. Acute/subacute infarct in the left frontal lobe over the convexity involving cortex and white matter. Cortical enhancement over the left frontal convexity without restricted diffusion suggesting late subacute infarct. Small area of acute or subacute infarct right medial parietal lobe. Subacute infarct with mild enhancement in the left parietal lobe. Cluster of small acute infarcts in the right posterior cerebellum with mild enhancement. Moderate chronic microvascular ischemic change in the white matter and pons. Mild atrophy without hydrocephalus. No intracranial hemorrhage or mass. Vascular: Normal arterial flow voids. Skull and upper cervical spine: No focal skeletal lesion.  Sinuses/Orbits: Paranasal sinuses clear.  Normal orbit Other: None MRA HEAD FINDINGS Both vertebral arteries patent to the basilar. Left PICA patent.  Right PICA not visualized but there is a right AICA which may contribute to this territory. Basilar widely patent. Fetal origin right posterior cerebral artery. Mild stenosis right P2 segment. Left posterior cerebral artery widely patent. Internal carotid artery patent bilaterally without stenosis. Hypoplastic right A1 segment which is small. Left A1 widely patent. Occlusion left A2 segment. Mild stenosis right A2 segment. Middle cerebral arteries patent bilaterally without significant stenosis. MRA NECK FINDINGS Antegrade flow in carotid and vertebral arteries bilaterally. Carotid bifurcation patent bilaterally without stenosis. Both vertebral arteries are patent without stenosis. IMPRESSION: 1. Multiple areas of acute and subacute infarct in the brain bilaterally suggestive of emboli. 2. Moderate chronic microvascular ischemic change in the white matter and pons. 3. Occlusion left A2 segment and mild stenosis right A2 segment. Hypoplastic right A1 segment. 4. Mild stenosis right P2 segment. Middle cerebral arteries patent bilaterally. Electronically Signed   By: Marlan Palau M.D.   On: 04/03/2020 14:38   US RENAL  Result Date: 04/04/2020 CLINICAL DATA:  Hematuria EXAM: RENAL / URINARY TRACT ULTRASOUND COMPLETE COMPARISON:  CT dated March 15, 2015 FINDINGS: Right Kidney: Renal measurements: 11.2 x 5.1 x 4.4 cm = volume: 130 mL . Echogenicity within normal limits. No mass or hydronephrosis visualized. A small 1.4 cm cyst is noted. Left Kidney: Renal measurements: 10.2 x 5.1 x 4.7 cm = volume: 126 mL. Echogenicity within normal limits. No mass or hydronephrosis visualized. Bladder: Appears normal for degree of bladder distention. Other: None. IMPRESSION: Unremarkable study. Electronically Signed   By: Katherine Mantle M.D.   On: 04/04/2020 21:46   ECHOCARDIOGRAM  COMPLETE  Result Date: 04/04/2020    ECHOCARDIOGRAM REPORT   Patient Name:   Jessica Jessica [name] Date of Exam: 04/04/2020 Medical Rec #:  [id number]    Height:       65.0 in Accession #:    [id number]   Weight:       162.8 lb Date of Birth:  April 08, 1961    BSA:          1.812 m Patient Age:    59 years     BP:           162/87 mmHg Patient Gender: F            HR:           74 bpm. Exam Location:  Inpatient Procedure: 2D Echo, Color Doppler, Cardiac Doppler and Intracardiac            Opacification Agent Indications:    TIA  History:        Patient has no prior history of Echocardiogram examinations.  Sonographer:    Roosvelt Maser RDCS Referring Phys: [id number] SARA-MAIZ A THOMAS IMPRESSIONS  1. Left ventricular ejection fraction, by estimation, is 60 to 65%. The left ventricle has normal function. The left ventricle has no regional wall motion abnormalities. Left ventricular diastolic parameters are consistent with Grade I diastolic dysfunction (impaired relaxation).  2. Right ventricular systolic function is normal. The right ventricular size is normal.  3. The mitral valve is normal in structure. No evidence of mitral valve regurgitation. No evidence of mitral stenosis.  4. The aortic valve is normal in structure. Aortic valve regurgitation is mild. No aortic stenosis is present.  5. The inferior vena cava is normal in size with greater than 50% respiratory variability, suggesting right atrial pressure of 3 mmHg. FINDINGS  Left Ventricle: Left ventricular ejection fraction, by estimation, is 60 to 65%. The left ventricle has normal  function. The left ventricle has no regional wall motion abnormalities. Definity contrast agent was given IV to delineate the left ventricular  endocardial borders. The left ventricular internal cavity size was normal in size. There is no left ventricular hypertrophy. Left ventricular diastolic parameters are consistent with Grade I diastolic dysfunction (impaired relaxation). Normal left  ventricular filling pressure. Right Ventricle: The right ventricular size is normal. No increase in right ventricular wall thickness. Right ventricular systolic function is normal. Left Atrium: Left atrial size was normal in size. Right Atrium: Right atrial size was normal in size. Pericardium: There is no evidence of pericardial effusion. Mitral Valve: The mitral valve is normal in structure. Normal mobility of the mitral valve leaflets. No evidence of mitral valve regurgitation. No evidence of mitral valve stenosis. Tricuspid Valve: The tricuspid valve is normal in structure. Tricuspid valve regurgitation is not demonstrated. No evidence of tricuspid stenosis. Aortic Valve: The aortic valve is normal in structure. Aortic valve regurgitation is mild. No aortic stenosis is present. Pulmonic Valve: The pulmonic valve was normal in structure. Pulmonic valve regurgitation is not visualized. No evidence of pulmonic stenosis. Aorta: The aortic root is normal in size and structure. Venous: The inferior vena cava is normal in size with greater than 50% respiratory variability, suggesting right atrial pressure of 3 mmHg. IAS/Shunts: No atrial level shunt detected by color flow Doppler.  LEFT VENTRICLE PLAX 2D LVIDd:         4.30 cm  Diastology LVIDs:         2.80 cm  LV e' lateral:   6.31 cm/s LV PW:         0.80 cm  LV E/e' lateral: 8.7 LV IVS:        0.80 cm  LV e' medial:    5.00 cm/s LVOT diam:     1.90 cm  LV E/e' medial:  11.0 LVOT Area:     2.84 cm  RIGHT VENTRICLE          IVC RV Basal diam:  2.80 cm  IVC diam: 1.20 cm LEFT ATRIUM           Index       RIGHT ATRIUM           Index LA diam:      2.20 cm 1.21 cm/m  RA Area:     16.70 cm LA Vol (A4C): 68.6 ml 37.85 ml/m RA Volume:   45.60 ml  25.16 ml/m   AORTA Ao Root diam: 2.80 cm Ao Asc diam:  2.90 cm MITRAL VALVE MV Area (PHT): 4.31 cm    SHUNTS MV Decel Time: 176 msec    Systemic Diam: 1.90 cm MV E velocity: 54.80 cm/s MV A velocity: 79.70 cm/s MV E/A ratio:   0.69 Mihai Croitoru MD Electronically signed by Thurmon FairMihai Croitoru MD Signature Date/Time: 04/04/2020/5:21:05 PM    Final    VAS US LOWER EXTREMITY VENOUS (DVT)  Result Date: 04/04/2020  Lower Venous DVTStudy Indications: Stroke.  Limitations: Positioning. Comparison Study: No prior study on file Performing Technologist: Sherren Kernsandace Kanady RVS  Examination Guidelines: A complete evaluation includes B-mode imaging, spectral Doppler, color Doppler, and power Doppler as needed of all accessible portions of each vessel. Bilateral testing is considered an integral part of a complete examination. Limited examinations for reoccurring indications may be performed as noted. The reflux portion of the exam is performed with the patient in reverse Trendelenburg.  +---------+---------------+---------+-----------+----------+--------------+ RIGHT    CompressibilityPhasicitySpontaneityPropertiesThrombus Aging +---------+---------------+---------+-----------+----------+--------------+ CFV  Full           Yes      Yes                                 +---------+---------------+---------+-----------+----------+--------------+ SFJ      Full                                                        +---------+---------------+---------+-----------+----------+--------------+ FV Prox  Full                                                        +---------+---------------+---------+-----------+----------+--------------+ FV Mid   Full                                                        +---------+---------------+---------+-----------+----------+--------------+ FV DistalFull                                                        +---------+---------------+---------+-----------+----------+--------------+ PFV      Full                                                        +---------+---------------+---------+-----------+----------+--------------+ POP      Full           Yes      Yes                                  +---------+---------------+---------+-----------+----------+--------------+ PTV      Full                                                        +---------+---------------+---------+-----------+----------+--------------+ PERO     Full                                                        +---------+---------------+---------+-----------+----------+--------------+   +---------+---------------+---------+-----------+----------+--------------+ LEFT     CompressibilityPhasicitySpontaneityPropertiesThrombus Aging +---------+---------------+---------+-----------+----------+--------------+ CFV      Full           Yes      Yes                                 +---------+---------------+---------+-----------+----------+--------------+  SFJ      Full                                                        +---------+---------------+---------+-----------+----------+--------------+ FV Prox  Full                                                        +---------+---------------+---------+-----------+----------+--------------+ FV Mid   Full                                                        +---------+---------------+---------+-----------+----------+--------------+ FV DistalFull                                                        +---------+---------------+---------+-----------+----------+--------------+ PFV      Full                                                        +---------+---------------+---------+-----------+----------+--------------+ POP      Full           Yes      Yes                                 +---------+---------------+---------+-----------+----------+--------------+ PTV      Full                                                        +---------+---------------+---------+-----------+----------+--------------+ PERO     Full                                                         +---------+---------------+---------+-----------+----------+--------------+     Summary: BILATERAL: - No evidence of deep vein thrombosis seen in the lower extremities, bilaterally. -   *See table(s) above for measurements and observations. Electronically signed by Waverly Ferrari MD on 04/04/2020 at 6:48:44 PM.    Final      Assessment/Plan: Diagnosis: Multiple bilateral anterior and posterior infarcts, embolic 1. Does the need for close, 24 hr/day medical supervision in concert with the patient's rehab needs make it unreasonable for this patient to be served in a less intensive setting? Yes 2. Co-Morbidities requiring supervision/potential complications: left upper lobe and right middle lobe cavitary pulmonary nodules worrisome for nodal metastases, nonspecific left adrenal  nodule, HTN, HLD, tobacco abuse 3. Due to bladder management, bowel management, safety, skin/wound care, disease management, medication administration, pain management and patient education, does the patient require 24 hr/day rehab nursing? Yes 4. Does the patient require coordinated care of a physician, rehab nurse, therapy disciplines of PT, OT, SLP to address physical and functional deficits in the context of the above medical diagnosis(es)? Yes Addressing deficits in the following areas: balance, endurance, locomotion, strength, transferring, bowel/bladder control, bathing, dressing, feeding, grooming, toileting, speech and psychosocial support 5. Can the patient actively participate in an intensive therapy program of at least 3 hrs of therapy per day at least 5 days per week? Yes 6. The potential for patient to make measurable gains while on inpatient rehab is excellent 7. Anticipated functional outcomes upon discharge from inpatient rehab are modified independent  with PT, modified independent with OT, modified independent with SLP. 8. Estimated rehab length of stay to reach the above functional goals is: 10-12  days 9. Anticipated discharge destination: Home 10. Overall Rehab/Functional Prognosis: excellent  RECOMMENDATIONS: This patient's condition is appropriate for continued rehabilitative care in the following setting: CIR Patient has agreed to participate in recommended program. Yes Note that insurance prior authorization may be required for reimbursement for recommended care.  Comment: Jessica Jessica [name] would be an excellent CIR candidate once medially stable (biopsy pending). She has the social support of her sister. Thank you for this consult. We will continue to follow in Jessica Jessica [name]'s care.   Mcarthur Rossetti Angiulli, PA-C 04/05/2020   I have personally performed a face to face diagnostic evaluation, including, but not limited to relevant history and physical exam findings, of this patient and developed relevant assessment and plan.  Additionally, I have reviewed and concur with the physician assistant's documentation above.  Sula Soda, MD

## 2020-04-05 NOTE — Progress Notes (Addendum)
NAME:  Jessica [name], MRN:  [id number], DOB:  1960-10-26, LOS: 2 ADMISSION DATE:  04/03/2020, CONSULTATION DATE: 04/04/2020 REFERRING MD: Dartha Lodge, CHIEF COMPLAINT: Cavitating lung nodule   History of present illness   History obtained from the chart and talking to the husband and also neurologist  59 year old female history of hyperlipidemia, chronic heavy smoking, history of cerebral aneurysms status post coiling.  On April 04, 2019 when she presented with right-sided weakness and CT/MRI evidence of multiple areas of acute and subacute infarct in the brain suggestive of emboli and occlusion of the left A2 segment which was considered the most recent symptomatic event.  She was loaded on Plavix 300 mg on April 03, 2020 and since then has been on 75 mg once daily.  Also started on aspirin 81 mg once daily on the day of admission.  Overall she has improved although the significant slowness in speech and mild right-sided deficit.  During the stay she underwent CT scan of the chest that shows left upper lobe 3.3 cm spiculated mass with peripheral cavitation and right middle lobe cavitating pulmonary 2 cm mass in the lateral segment with cavitation and an indeterminate 10 mm nodule in the left adrenal gland.  There was also pathologic enlargement of mediastinal adenopathy in the precarinal and the subcarinal and hilar areas [noted noncontrasted CT] and therefore pulmonary has been consulted.  CT scan also showed background evidence of emphysema.    Patient husband reports history of cough for the last 8 months early in the morning when she smokes a cigarette.  Endorses that she been smoking cigarettes since age 56.  Otherwise no autoimmune disease or vasculitis.  No hemoptysis no weight loss.  No shortness of breath or wheezing no edema no proximal nocturnal dyspnea  According to the neurologist okay to stop Plavix for procedure if needed.  Past Medical History   has a past medical history of High  cholesterol and Stroke (HCC).   reports that she has been smoking cigarettes. She has never used smokeless tobacco.   Significant Hospital Events   none  Consults:  pccm neurology  Procedures:    Significant Diagnostic Tests:  CT chest IMPRESSION: 1. 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 The left upper lobe nodule is spiculated. Malignancy is the diagnosis of exclusion. Far less likely cavitating infection or septic emboli could be considered in the appropriate setting. 2. Pathologic mediastinal and hilar adenopathy worrisome for nodal metastases. 3. Nonspecific left adrenal nodule, metastatic disease not excluded. 4. Aortic Atherosclerosis (ICD10-I70.0) and Emphysema (ICD10-J43.9).   Micro Data:  None  Antimicrobials:     Interim history/subjective:  No overnight events Feels fine  Objective   Blood pressure 137/84, pulse 70, temperature 98.2 F (36.8 C), temperature source Oral, resp. rate 18, height 5\' 5"  (1.651 m), weight 73.8 kg, SpO2 98 %.       No intake or output data in the 24 hours ending 04/05/20 1407 Filed Weights   04/03/20 0943  Weight: 73.8 kg    Examination: General: Middle-aged lady, does not appear to be in distress HENT: Moist oral mucosa Lungs: Air entry at the bases bilaterally Cardiovascular: S1-S2 appreciated Abdomen: No sounds appreciated  Resolved Hospital Problem list     Assessment & Plan:  CVA -Multiple infarcts on MRI  Abnormal CT scan of the chest showing left lung nodule, cavitating nodule Concern for neoplastic process  Hilar and mediastinal adenopathy  Concern for lung cancer  Chronic obstructive  pulmonary disease -Continue bronchodilator treatments  Follow vasculitic panel  Plavix on hold -This may hold for about 5 days  Smoking cessation counseling  We will follow up with when bronchoscopy can be scheduled-navigational bronchoscopy/EBUS  Sherrilyn Rist, MD [facility]  [facility] Pager: [phone]  Addendum: Called centralized scheduling to try and schedule bronchoscopy for Friday Cannot schedule for Friday as there are no openings.  We should go forward with planning for PET scan and plan biopsy accordingly depending on findings on PET scan  Sherrilyn Rist, MD [facility] Pager: [phone]

## 2020-04-05 NOTE — Progress Notes (Signed)
PROGRESS NOTE    Kyrstyn Greear  ZOX:[id number] DOB: Feb 11, 1961 DOA: 04/03/2020 PCP: Patient, No Pcp Per  Outpatient Specialists:   Brief Narrative:  As per H&P done by Alda Berthold "Sila Sarsfield is a 59 y.o. female with medical history significant of  HLD , cerebral aneurysms status post coiling,presents to ed with complaint of R leg numbness that she noted on awaking at 4am the morning of admission. She describes symptoms as heaviness and numbness of her right leg.She states the prior evening before going to bed at 10 pm she  was in her normal state of health. She states her symptoms lasted 30 minutes then resolved. She however s/p resolution of described symptoms felt that her right side was still  Somewhat weak compared to her baseline. However she denies any falls or difficulty with her balance or ambulation.  She denies any associated, confusion, HA, speech difficulties, difficulties with swallowing, facial asymmetry, but does mention milder R upper extremity symptoms but no any other focal areas of paresthesias or limb weakness. She also denied any vision changes, presyncope, sob, chest pain, fever chills, n/v, back pain , dysuria, or bowel or bladder incontinence. She does however note slowing of her thought process.    ED Course:  Afeb, bp 166/78, hr 73, rr 21 , sat 95% on ra  Neuro exam non-focal no noted deficits"   CT scan:  1.No acute findings.  2. 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  3. Chronic ischemic microvascular disease. Possible old left cerebellar lacunar infarct as well as small old left occipital infarct.  MRI/MRA: 1. Multiple areas of acute and subacute infarct in the brain bilaterally suggestive of emboli. 2. Moderate chronic microvascular ischemic change in the white matter and pons. 3. Occlusion left A2 segment and mild stenosis right A2 segment. Hypoplastic right A1 segment. 4. Mild stenosis right  P2 segment. Middle cerebral arteries patent Bilaterally.  CT chest without contrast revealed: 1. 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 The left upper lobe nodule is spiculated. Malignancy is the diagnosis of exclusion. Far less likely cavitating infection or septic emboli could be considered in the appropriate setting. 2. Pathologic mediastinal and hilar adenopathy worrisome for nodal metastases. 3. Nonspecific left adrenal nodule, metastatic disease not excluded. 4. Aortic Atherosclerosis (ICD10-I70.0) and Emphysema (ICD10-J43.9).  04/04/2020: Patient seen alongside patient's sister and brother.  Patient is not particularly talkative.  Patient seemed slowed.  The history is that patient developed right-sided weakness at work.  MRI brain is as documented above. CT Chest is suggestive of possible malignancy.  UA reveals large Hemoglobin and greater than RBC. Will order renal ultrasound.  04/05/2020: Patient seen.  Input from neurology team is appreciated.  Pulmonary team is directing worrisome lung nodule work-up.  Plavix is on hold.  CIR has been recommended.  Will consult rehab team.  Patient was seen alongside patient's sister.  Patient and patient's sister updated.  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 -Consult rehab team.  Hyperlipidemia: -continue statin   Hypertension: -Controlled.  Abnormal chest x-ray and CT chest:  -CT chest is as documented above.   -Results concerning  for possible malignancy.   -I have consulted with the pulmonary team (Dr. Marchelle Gearingamaswamy will see patient in consultation)  -Current acute CVA will  likely interfere with patient's work-up, but will defer to the pulmonary team. -Pulmonary directing care.  Plavix is on hold.  Microscopic hematuria: -Renal ultrasound. -Further management depend on above. 04/05/2020: Renal ultrasound is unremarkable.  DVT prophylaxis: SCD Code Status: Full code  Family Communication: Brother and sister Disposition Plan: This will depend on hospital course   Consultants:   Neurology.  Pulmonary.  Procedures:   None  Antimicrobials:   None  Subjective: No significant history from patient  Objective: Vitals:   04/05/20 0326 04/05/20 0830 04/05/20 1202 04/05/20 1704  BP: (!) 145/80 137/84  (!) 152/84  Pulse: 72 70  82  Resp: 19  18 18   Temp: 98.2 F (36.8 C) 98 F (36.7 C) 98.2 F (36.8 C) 98.5 F (36.9 C)  TempSrc: Oral Oral Oral Oral  SpO2: 92% 96% 98% 94%  Weight:      Height:       No intake or output data in the 24 hours ending 04/05/20 1753 Filed Weights   04/03/20 0943  Weight: 73.8 kg    Examination:  General exam: More interactive today.  Appears calm and comfortable  Respiratory system: Clear to auscultation.  Cardiovascular system: S1 & S2 heard Gastrointestinal system: Abdomen is nondistended, soft and nontender. No organomegaly or masses felt. Normal bowel sounds heard. Central nervous system: Awake and alert.  Patient moves all extremities.  Right-sided weakness.  Extremities: No leg edema  Data Reviewed: I have personally reviewed following labs and imaging studies  CBC: Recent Labs  Lab 04/03/20 1004  WBC 8.1  NEUTROABS 4.8  HGB 13.3  HCT 41.3  MCV 91.0  PLT 188   Basic Metabolic Panel: Recent Labs  Lab 04/03/20 1004  NA 142  K 3.8  CL 107  CO2 24  GLUCOSE 101*  BUN 10  CREATININE 0.48  CALCIUM 8.9   GFR: Estimated Creatinine Clearance: 74.3 mL/min (by C-G formula based on SCr of 0.48 mg/dL). Liver Function Tests: Recent Labs  Lab 04/03/20 1004  AST 18  ALT 22  ALKPHOS 108    BILITOT 0.7  PROT 7.1  ALBUMIN 4.0   No results for input(s): LIPASE, AMYLASE in the last 168 hours. No results for input(s): AMMONIA in the last 168 hours. Coagulation Profile: Recent Labs  Lab 04/03/20 1004  INR 1.1   Cardiac Enzymes: No results for input(s): CKTOTAL, CKMB, CKMBINDEX, TROPONINI in the last 168 hours. BNP (last 3 results) No results for input(s): PROBNP in the last 8760 hours. HbA1C: Recent Labs    04/04/20 0321  HGBA1C 6.0*   CBG: Recent Labs  Lab 04/03/20 0943  GLUCAP 93   Lipid Profile: Recent Labs    04/04/20 0321  CHOL 139  HDL 37*  LDLCALC 82  TRIG 98  CHOLHDL 3.8   Thyroid Function Tests: Recent Labs    04/04/20 0733  TSH 1.083   Anemia Panel: Recent Labs    04/04/20 0733  VITAMINB12 407   Urine analysis:    Component Value Date/Time   COLORURINE YELLOW 04/04/2020 0644   APPEARANCEUR CLEAR 04/04/2020 0644   LABSPEC 1.012 04/04/2020 0644   PHURINE 6.0 04/04/2020 0644   GLUCOSEU NEGATIVE 04/04/2020 0644   HGBUR LARGE (A) 04/04/2020 0644   BILIRUBINUR NEGATIVE 04/04/2020 0644   KETONESUR NEGATIVE 04/04/2020 0644   PROTEINUR NEGATIVE 04/04/2020 0644   NITRITE  NEGATIVE 04/04/2020 0644   LEUKOCYTESUR NEGATIVE 04/04/2020 0644   Sepsis Labs: @LABRCNTIP (procalcitonin:4,lacticidven:4)  ) Recent Results (from the past 240 hour(s))  SARS Coronavirus 2 by RT PCR (hospital order, performed in [facility] hospital lab) Nasopharyngeal Nasopharyngeal Swab     Status: None   Collection Time: 04/03/20  2:01 PM   Specimen: Nasopharyngeal Swab  Result Value Ref Range Status   SARS Coronavirus 2 NEGATIVE NEGATIVE Final    Comment: (NOTE) SARS-CoV-2 target nucleic acids are NOT DETECTED.  The SARS-CoV-2 RNA is generally detectable in upper and lower respiratory specimens during the acute phase of infection. The lowest concentration of SARS-CoV-2 viral copies this assay can detect is 250 copies / mL. A negative result does not  preclude SARS-CoV-2 infection and should not be used as the sole basis for treatment or other patient management decisions.  A negative result may occur with improper specimen collection / handling, submission of specimen other than nasopharyngeal swab, presence of viral mutation(s) within the areas targeted by this assay, and inadequate number of viral copies (<250 copies / mL). A negative result must be combined with clinical observations, patient history, and epidemiological information.  Fact Sheet for Patients:   04/05/20  Fact Sheet for Healthcare Providers: BoilerBrush.com.cy  This test is not yet approved or  cleared by the https://pope.com/ FDA and has been authorized for detection and/or diagnosis of SARS-CoV-2 by FDA under an Emergency Use Authorization (EUA).  This EUA will remain in effect (meaning this test can be used) for the duration of the COVID-19 declaration under Section 564(b)(1) of the Act, 21 U.S.C. section 360bbb-3(b)(1), unless the authorization is terminated or revoked sooner.  Performed at [facility], [address] [place], [place]          Radiology Studies: DG Chest 2 View  Result Date: 04/03/2020 CLINICAL DATA:  TIA EXAM: CHEST - 2 VIEW COMPARISON:  Radiograph 01/20/2017 FINDINGS: Low lung volumes with streaky basilar areas of atelectasis. A more nodular masslike opacity is present in the right mid to lower lung. There is mild central vascular congestion and central airways thickening. No pneumothorax or visible effusion. The aorta is calcified. The remaining cardiomediastinal contours are unremarkable. No acute osseous or soft tissue abnormality. IMPRESSION: 1. Low lung volumes with streaky basilar atelectasis. 2. Additional central vascular congestion, airways thickening, and few septal lines could reflect developing interstitial edema. 3. Nodular opacity in the right mid  to lower lung, recommend further evaluation with CT imaging. Electronically Signed   By: 01/22/2017 M.D.   On: 04/03/2020 21:36   CT CHEST WO CONTRAST  Result Date: 04/04/2020 CLINICAL DATA:  Shortness of breath, right lung nodularity on chest x-ray EXAM: CT CHEST WITHOUT CONTRAST TECHNIQUE: Multidetector CT imaging of the chest was performed following the standard protocol without IV contrast. COMPARISON:  04/03/2020 FINDINGS: Cardiovascular: Unenhanced imaging of the heart and great vessels demonstrates no pericardial effusion. Mild atherosclerosis of the thoracic aorta. Mediastinum/Nodes: Pathologic mediastinal adenopathy is identified. Largest lymph node in the AP window measures 13 mm in short axis, reference image 52/3. Precarinal adenopathy measures up to 14 mm in short axis, reference image 56/3. Subcarinal and hilar adenopathy are also identified, more difficult to measure given lack of IV contrast. The thyroid, trachea, and esophagus are grossly unremarkable. Lungs/Pleura: Spiculated left upper lobe mass is identified with peripheral cavitation, measuring up to 3.3 x 2.2 cm reference image 48/4. There is a second cavitating nodule within the lateral  segment right middle lobe measuring 2.0 x 1.9 cm reference image 98/4. This corresponds to chest x-ray finding. There is background emphysema. No effusion or pneumothorax. Dependent hypoventilatory changes are noted. Upper Abdomen: Indeterminate 10 mm nodule within the left adrenal gland is noted. The remainder of the upper abdomen is unremarkable. Musculoskeletal: No acute or destructive bony lesions. Reconstructed images demonstrate no additional findings. IMPRESSION: 1. 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 The left upper lobe nodule is spiculated. Malignancy is the diagnosis of exclusion. Far less likely cavitating infection or septic emboli could be considered in the appropriate setting. 2. Pathologic mediastinal and  hilar adenopathy worrisome for nodal metastases. 3. Nonspecific left adrenal nodule, metastatic disease not excluded. 4. Aortic Atherosclerosis (ICD10-I70.0) and Emphysema (ICD10-J43.9). These results will be called to the ordering clinician or representative by the Radiologist Assistant, and communication documented in the PACS or [employer]. Electronically Signed   By: Sharlet Salina M.D.   On: 04/04/2020 01:41   US RENAL  Result Date: 04/04/2020 CLINICAL DATA:  Hematuria EXAM: RENAL / URINARY TRACT ULTRASOUND COMPLETE COMPARISON:  CT dated March 15, 2015 FINDINGS: Right Kidney: Renal measurements: 11.2 x 5.1 x 4.4 cm = volume: 130 mL . Echogenicity within normal limits. No mass or hydronephrosis visualized. A small 1.4 cm cyst is noted. Left Kidney: Renal measurements: 10.2 x 5.1 x 4.7 cm = volume: 126 mL. Echogenicity within normal limits. No mass or hydronephrosis visualized. Bladder: Appears normal for degree of bladder distention. Other: None. IMPRESSION: Unremarkable study. Electronically Signed   By: Katherine Mantle M.D.   On: 04/04/2020 21:46   ECHOCARDIOGRAM COMPLETE  Result Date: 04/04/2020    ECHOCARDIOGRAM REPORT   Patient Name:   TERINA MCELHINNY Date of Exam: 04/04/2020 Medical Rec #:  [id number]    Height:       65.0 in Accession #:    [id number]   Weight:       162.8 lb Date of Birth:  Jul 14, 1961    BSA:          1.812 m Patient Age:    59 years     BP:           162/87 mmHg Patient Gender: F            HR:           74 bpm. Exam Location:  Inpatient Procedure: 2D Echo, Color Doppler, Cardiac Doppler and Intracardiac            Opacification Agent Indications:    TIA  History:        Patient has no prior history of Echocardiogram examinations.  Sonographer:    Roosvelt Maser RDCS Referring Phys: [id number] SARA-MAIZ A THOMAS IMPRESSIONS  1. Left ventricular ejection fraction, by estimation, is 60 to 65%. The left ventricle has normal function. The left ventricle has no regional wall motion  abnormalities. Left ventricular diastolic parameters are consistent with Grade I diastolic dysfunction (impaired relaxation).  2. Right ventricular systolic function is normal. The right ventricular size is normal.  3. The mitral valve is normal in structure. No evidence of mitral valve regurgitation. No evidence of mitral stenosis.  4. The aortic valve is normal in structure. Aortic valve regurgitation is mild. No aortic stenosis is present.  5. The inferior vena cava is normal in size with greater than 50% respiratory variability, suggesting right atrial pressure of 3 mmHg. FINDINGS  Left Ventricle: Left ventricular ejection fraction, by estimation, is 60  to 65%. The left ventricle has normal function. The left ventricle has no regional wall motion abnormalities. Definity contrast agent was given IV to delineate the left ventricular  endocardial borders. The left ventricular internal cavity size was normal in size. There is no left ventricular hypertrophy. Left ventricular diastolic parameters are consistent with Grade I diastolic dysfunction (impaired relaxation). Normal left ventricular filling pressure. Right Ventricle: The right ventricular size is normal. No increase in right ventricular wall thickness. Right ventricular systolic function is normal. Left Atrium: Left atrial size was normal in size. Right Atrium: Right atrial size was normal in size. Pericardium: There is no evidence of pericardial effusion. Mitral Valve: The mitral valve is normal in structure. Normal mobility of the mitral valve leaflets. No evidence of mitral valve regurgitation. No evidence of mitral valve stenosis. Tricuspid Valve: The tricuspid valve is normal in structure. Tricuspid valve regurgitation is not demonstrated. No evidence of tricuspid stenosis. Aortic Valve: The aortic valve is normal in structure. Aortic valve regurgitation is mild. No aortic stenosis is present. Pulmonic Valve: The pulmonic valve was normal in structure.  Pulmonic valve regurgitation is not visualized. No evidence of pulmonic stenosis. Aorta: The aortic root is normal in size and structure. Venous: The inferior vena cava is normal in size with greater than 50% respiratory variability, suggesting right atrial pressure of 3 mmHg. IAS/Shunts: No atrial level shunt detected by color flow Doppler.  LEFT VENTRICLE PLAX 2D LVIDd:         4.30 cm  Diastology LVIDs:         2.80 cm  LV e' lateral:   6.31 cm/s LV PW:         0.80 cm  LV E/e' lateral: 8.7 LV IVS:        0.80 cm  LV e' medial:    5.00 cm/s LVOT diam:     1.90 cm  LV E/e' medial:  11.0 LVOT Area:     2.84 cm  RIGHT VENTRICLE          IVC RV Basal diam:  2.80 cm  IVC diam: 1.20 cm LEFT ATRIUM           Index       RIGHT ATRIUM           Index LA diam:      2.20 cm 1.21 cm/m  RA Area:     16.70 cm LA Vol (A4C): 68.6 ml 37.85 ml/m RA Volume:   45.60 ml  25.16 ml/m   AORTA Ao Root diam: 2.80 cm Ao Asc diam:  2.90 cm MITRAL VALVE MV Area (PHT): 4.31 cm    SHUNTS MV Decel Time: 176 msec    Systemic Diam: 1.90 cm MV E velocity: 54.80 cm/s MV A velocity: 79.70 cm/s MV E/A ratio:  0.69 Mihai Croitoru MD Electronically signed by Sanda Klein MD Signature Date/Time: 04/04/2020/5:21:05 PM    Final    VAS [place] LOWER EXTREMITY VENOUS (DVT)  Result Date: 04/04/2020  Lower Venous DVTStudy Indications: Stroke.  Limitations: Positioning. Comparison Study: No prior study on file Performing Technologist: Sharion Dove RVS  Examination Guidelines: A complete evaluation includes B-mode imaging, spectral Doppler, color Doppler, and power Doppler as needed of all accessible portions of each vessel. Bilateral testing is considered an integral part of a complete examination. Limited examinations for reoccurring indications may be performed as noted. The reflux portion of the exam is performed with the patient in reverse Trendelenburg.  +---------+---------------+---------+-----------+----------+--------------+ RIGHT  CompressibilityPhasicitySpontaneityPropertiesThrombus Aging +---------+---------------+---------+-----------+----------+--------------+ CFV      Full           Yes      Yes                                 +---------+---------------+---------+-----------+----------+--------------+ SFJ      Full                                                        +---------+---------------+---------+-----------+----------+--------------+ FV Prox  Full                                                        +---------+---------------+---------+-----------+----------+--------------+ FV Mid   Full                                                        +---------+---------------+---------+-----------+----------+--------------+ FV DistalFull                                                        +---------+---------------+---------+-----------+----------+--------------+ PFV      Full                                                        +---------+---------------+---------+-----------+----------+--------------+ POP      Full           Yes      Yes                                 +---------+---------------+---------+-----------+----------+--------------+ PTV      Full                                                        +---------+---------------+---------+-----------+----------+--------------+ PERO     Full                                                        +---------+---------------+---------+-----------+----------+--------------+   +---------+---------------+---------+-----------+----------+--------------+ LEFT     CompressibilityPhasicitySpontaneityPropertiesThrombus Aging +---------+---------------+---------+-----------+----------+--------------+ CFV      Full           Yes      Yes                                 +---------+---------------+---------+-----------+----------+--------------+  SFJ      Full                                                         +---------+---------------+---------+-----------+----------+--------------+ FV Prox  Full                                                        +---------+---------------+---------+-----------+----------+--------------+ FV Mid   Full                                                        +---------+---------------+---------+-----------+----------+--------------+ FV DistalFull                                                        +---------+---------------+---------+-----------+----------+--------------+ PFV      Full                                                        +---------+---------------+---------+-----------+----------+--------------+ POP      Full           Yes      Yes                                 +---------+---------------+---------+-----------+----------+--------------+ PTV      Full                                                        +---------+---------------+---------+-----------+----------+--------------+ PERO     Full                                                        +---------+---------------+---------+-----------+----------+--------------+     Summary: BILATERAL: - No evidence of deep vein thrombosis seen in the lower extremities, bilaterally. -   *See table(s) above for measurements and observations. Electronically signed by Waverly Ferrari MD on 04/04/2020 at 6:48:44 PM.    Final         Scheduled Meds: . aspirin EC  81 mg Oral Daily  . atorvastatin  80 mg Oral QHS  . umeclidinium bromide  1 puff Inhalation Daily   Continuous Infusions:   LOS: 2 days    Time spent: 25 minutes.    Berton Mount, MD  [facility] Pager #: [phone] 7PM-7AM contact night  coverage as above

## 2020-04-05 NOTE — Progress Notes (Signed)
STROKE TEAM PROGRESS NOTE   INTERVAL HISTORY Sister is at the bedside.  Pt sitting in chair for lunch. She is neuro stable, no significant change from yesterday, still psychomotor slowing. Pulmonary on board, will consider biopsy. OK to hold off plavix for 5 days before procedure.   OBJECTIVE Vitals:   04/05/20 0325 04/05/20 0326 04/05/20 0830 04/05/20 1202  BP: (!) 145/80 (!) 145/80 137/84   Pulse:  72 70   Resp: (!) 21 19  18   Temp:  98.2 F (36.8 C) 98 F (36.7 C) 98.2 F (36.8 C)  TempSrc:  Oral Oral Oral  SpO2: 90% 92% 96% 98%  Weight:      Height:        CBC:  Recent Labs  Lab 04/03/20 1004  WBC 8.1  NEUTROABS 4.8  HGB 13.3  HCT 41.3  MCV 91.0  PLT 671    Basic Metabolic Panel:  Recent Labs  Lab 04/03/20 1004  NA 142  K 3.8  CL 107  CO2 24  GLUCOSE 101*  BUN 10  CREATININE 0.48  CALCIUM 8.9    Lipid Panel:     Component Value Date/Time   CHOL 139 04/04/2020 0321   TRIG 98 04/04/2020 0321   HDL 37 (L) 04/04/2020 0321   CHOLHDL 3.8 04/04/2020 0321   VLDL 20 04/04/2020 0321   LDLCALC 82 04/04/2020 0321   HgbA1c:  Lab Results  Component Value Date   HGBA1C 6.0 (H) 04/04/2020   Urine Drug Screen:     Component Value Date/Time   LABOPIA NONE DETECTED 04/04/2020 0847   COCAINSCRNUR NONE DETECTED 04/04/2020 0847   LABBENZ NONE DETECTED 04/04/2020 0847   AMPHETMU NONE DETECTED 04/04/2020 0847   THCU NONE DETECTED 04/04/2020 0847   LABBARB NONE DETECTED 04/04/2020 0847    Alcohol Level     Component Value Date/Time   ETH <10 04/03/2020 1004    IMAGING  DG Chest 2 View 04/03/2020 IMPRESSION:  1. Low lung volumes with streaky basilar atelectasis.  2. Additional central vascular congestion, airways thickening, and few septal lines could reflect developing interstitial edema.  3. Nodular opacity in the right mid to lower lung, recommend further evaluation with CT imaging.    CT Head Wo Contrast 04/03/2020 IMPRESSION:  1.  No acute  findings.  2. 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  3. Chronic ischemic microvascular disease. Possible old left cerebellar lacunar infarct as well as small old left occipital infarct.   CT CHEST WO CONTRAST 04/04/2020 IMPRESSION:  1. 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 The left upper lobe nodule is spiculated. Malignancy is the diagnosis of exclusion. Far less likely cavitating infection or septic emboli could be considered in the appropriate setting.  2. Pathologic mediastinal and hilar adenopathy worrisome for nodal metastases.  3. Nonspecific left adrenal nodule, metastatic disease not excluded.  4. Aortic Atherosclerosis (ICD10-I70.0) and Emphysema (ICD10-J43.9).  MR Brain W and Wo Contrast MR ANGIO HEAD WO CONTRAST MR ANGIO NECK WO CONTRAST 04/03/2020 IMPRESSION:  1. Multiple areas of acute and subacute infarct in the brain bilaterally suggestive of emboli.  2. Moderate chronic microvascular ischemic change in the white matter and pons.  3. Occlusion left A2 segment and mild stenosis right A2 segment. Hypoplastic right A1 segment.  4. Mild stenosis right P2 segment. Middle cerebral arteries patent bilaterally.     ECG - SR rate 73 BPM. (See cardiology reading for complete details)   PHYSICAL  EXAM  Temp:  [98 F (36.7 C)-98.5 F (36.9 C)] 98.2 F (36.8 C) (06/28 1202) Pulse Rate:  [67-77] 70 (06/28 0830) Resp:  [15-21] 18 (06/28 1202) BP: (118-145)/(64-84) 137/84 (06/28 0830) SpO2:  [90 %-98 %] 98 % (06/28 1202)  General - Well nourished, well developed, in no apparent distress.  Ophthalmologic - fundi not visualized due to noncooperation.  Cardiovascular - Regular rhythm and rate, frequent PVCs on tele.  Mental Status -  Level of arousal and orientation to time, place, and person were intact. Language including expression, naming, repetition, comprehension was assessed and found  intact. However, significant psychmotor slowing  Cranial Nerves II - XII - II - Visual field intact OU. III, IV, VI - Extraocular movements intact. V - Facial sensation intact bilaterally. VII - right facial droop. VIII - Hearing & vestibular intact bilaterally. X - Palate elevates symmetrically. XI - Chin turning & shoulder shrug intact bilaterally. XII - Tongue protrusion intact.  Motor Strength - The patient's strength was normal in left UE and LE, however, right UE and LE 4/5 with mild pronator drift.  Bulk was normal and fasciculations were absent.   Motor Tone - Muscle tone was assessed at the neck and appendages and was normal.  Reflexes - The patient's reflexes were symmetrical in all extremities and she had no pathological reflexes.  Sensory - Light touch, temperature/pinprick were assessed and were symmetrical.    Coordination - The patient had normal movements in the hands with no ataxia or dysmetria, although slow action, more so on the right.  Tremor was absent.  Gait and Station - deferred.   ASSESSMENT/PLAN Jessica [name] is a 59 y.o. female with history of ongoing tobacco use, cerebral aneurysms (coiled), high cholesterol and previous stroke who presents with right-sided numbness / weakness which mostly resolved and tremor of right arm and leg.  She did not receive IV t-PA due to late presentation.  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 hypercoagulable due to malignancy  CT head -  No acute findings. 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 Chronic ischemic microvascular disease. Possible old left cerebellar lacunar infarct as well as small old left occipital infarct.   MRI head - Multiple areas of acute and subacute infarct in the brain bilaterally suggestive of systemic emboli.   MRA H&N - Occlusion left A2 segment and mild stenosis right A2 segment. Hypoplastic  right A1 segment.   2D Echo EF 60-65%  LE venous doppler no DVT  May consider TEE and/or loop lung cancer diagnosis ruled out.  Sars Corona Virus 2 - negative  LDL - 82  HgbA1c - 6.0  UDS - neg  Hypercoagulable and autoimmune labs neg  VTE prophylaxis - SCDs  aspirin 81 mg daily prior to admission, now on ASA 81mg . Plavix on hold in preparation of biopsy.  Patient counseled to be compliant with her antithrombotic medications  Ongoing aggressive stroke risk factor management  Therapy recommendations:  CIR  Disposition:  Pending  ? Malignancy   Chest CT -  Left upper lobe and right middle lobe cavitating pulmonary nodules. Pathologic mediastinal and hilar adenopathy worrisome for nodal metastases. Nonspecific left adrenal nodule, metastatic disease not excluded.  Pt denies discomfort or weight loss  Pulmonary on board to prepare for lung biopsy to rule out lung malignancy  plavix on hold for 5 days before procedure  LE venous doppler neg for DVT  Hypertension  Home BP meds: none   Current BP meds: none   BP stable . Permissive hypertension (OK if < 220/120) but gradually normalize in 2-3 days  . Long-term BP goal normotensive  Hyperlipidemia  Home Lipid lowering medication: Lipitor 80 mg daily  LDL 82, goal < 70  Current lipid lowering medication: lipitor 80   Continue statin at discharge  Tobacco abuse  Current smoker  Smoking cessation counseling provided  Pt is willing to quit  Other Stroke Risk Factors  Advanced age  Hx stroke/TIA on imaging  Other Active Problems  Code status - Full code  Hospital day # 2  Marvel Plan, MD PhD Stroke Neurology 04/05/2020 2:46 PM   To contact Stroke Continuity provider, please refer to [email or web address]. After hours, contact General Neurology

## 2020-04-06 LAB — CYCLIC CITRUL PEPTIDE ANTIBODY, IGG/IGA: CCP Antibodies IgG/IgA: 3 units (ref 0–19)

## 2020-04-06 LAB — BETA-2-GLYCOPROTEIN I ABS, IGG/M/A
Beta-2 Glyco I IgG: <9 GPI IgG units (ref 0–20)
Beta-2-Glycoprotein I IgA: <9 GPI IgA units (ref 0–25)
Beta-2-Glycoprotein I IgM: <9 GPI IgM units (ref 0–32)

## 2020-04-06 LAB — ANGIOTENSIN CONVERTING ENZYME: Angiotensin-Converting Enzyme: 35 U/L (ref 14–82)

## 2020-04-06 LAB — LUPUS ANTICOAGULANT PANEL
DRVVT: 37.3 s (ref 0.0–47.0)
PTT Lupus Anticoagulant: 28.7 s (ref 0.0–51.9)

## 2020-04-06 LAB — ANTINUCLEAR ANTIBODIES, IFA: ANA Ab, IFA: NEGATIVE

## 2020-04-06 LAB — CARDIOLIPIN ANTIBODIES, IGG, IGM, IGA
Anticardiolipin IgA: <9 APL U/mL (ref 0–11)
Anticardiolipin IgG: <9 GPL U/mL (ref 0–14)
Anticardiolipin IgM: <9 MPL U/mL (ref 0–12)

## 2020-04-06 NOTE — Progress Notes (Signed)
Pt more alert and brighter affect today.  She verbalizes no complaints.  Responses are not as delayed as yesterday.  Pt states she feels much better but is concerned about pulmonary status.  Support given.

## 2020-04-06 NOTE — Progress Notes (Signed)
Physical Therapy Treatment Patient Details Name: Jessica [name] MRN: [id number] DOB: 01/13/1961 Today's Date: 04/06/2020    History of Present Illness Pt is a 59 y/o female presenting to the ED on 04/03/20 with primary complaints of R sided weakness. 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 Patient admitted for further work-up. PMH of HLD , cerebral aneurysms status post coiling.    PT Comments    Patient is making progress toward PT goals and eager to mobilize today. Pt continues to present with R side inattention, impaired balance, and slow processing. Pt with less frequent LOB during functional mobility and less assistance required while ambulating today. Pt did experience LOB with turning to R side and with horizontal head turns. Pt requires cues to navigate environment due to tendency to run into objects on R side. Continue to recommend CIR for further skilled PT services to maximize independence and safety with mobility.     Follow Up Recommendations  CIR     Equipment Recommendations  None recommended by PT    Recommendations for Other Services Rehab consult     Precautions / Restrictions Precautions Precautions: Fall Restrictions Weight Bearing Restrictions: No    Mobility  Bed Mobility Overal bed mobility: Modified Independent Bed Mobility: Supine to Sit           General bed mobility comments: use of rail and increased time/effort   Transfers Overall transfer level: Needs assistance Equipment used: None Transfers: Sit to/from Stand Sit to Stand: Min guard;Min assist         General transfer comment: min guard to stand and min A in standing due to posterior bias; no overt LOB  Ambulation/Gait Ambulation/Gait assistance: Min assist Gait Distance (Feet): 200 Feet Assistive device:  (assist at trunk with gait belt) Gait Pattern/deviations: Step-through pattern;Decreased stride length;Narrow base  of support;Drifts right/left Gait velocity: decreased   General Gait Details: assistance required for balance; LOB with turning to R side and with horizontal head turns; pt runs into objects on R side and requires cues throughout to attend to R side    Stairs Stairs: Yes Stairs assistance: Min assist Stair Management: No rails;Alternating pattern;One rail Left;Step to pattern;Forwards Number of Stairs: 2 General stair comments: pt initially felt she ascend steps without rail however unsteady and hit R toes on step; rail used for increased stability and cues for step to pattern    Wheelchair Mobility    Modified Rankin (Stroke Patients Only)       Balance Overall balance assessment: Needs assistance Sitting-balance support: No upper extremity supported;Feet supported Sitting balance-Leahy Scale: Good     Standing balance support: During functional activity;No upper extremity supported Standing balance-Leahy Scale: Fair Standing balance comment: able to static stand without UE support                  Standardized Balance Assessment Standardized Balance Assessment : Dynamic Gait Index   Dynamic Gait Index Level Surface: Mild Impairment Change in Gait Speed: Mild Impairment Gait with Horizontal Head Turns: Moderate Impairment Gait with Vertical Head Turns: Mild Impairment Gait and Pivot Turn: Mild Impairment Step Over Obstacle: Mild Impairment Step Around Obstacles: Moderate Impairment Steps: Mild Impairment Total Score: 14      Cognition Arousal/Alertness: Awake/alert Behavior During Therapy: Flat affect Overall Cognitive Status: No family/caregiver present to determine baseline cognitive functioning Area of Impairment: Problem solving  Problem Solving: Decreased initiation;Slow processing;Requires verbal cues General Comments: grossly flat affect but smiling appropriately and able to converse appropriately; slow  processing and decreased safety awareness       Exercises      General Comments        Pertinent Vitals/Pain Pain Assessment: No/denies pain    Home Living                      Prior Function            PT Goals (current goals can now be found in the care plan section) Acute Rehab PT Goals Patient Stated Goal: none stated Progress towards PT goals: Progressing toward goals    Frequency    Min 4X/week      PT Plan Current plan remains appropriate    Co-evaluation              AM-PAC PT "6 Clicks" Mobility   Outcome Measure  Help needed turning from your back to your side while in a flat bed without using bedrails?: A Little Help needed moving from lying on your back to sitting on the side of a flat bed without using bedrails?: A Little Help needed moving to and from a bed to a chair (including a wheelchair)?: A Little Help needed standing up from a chair using your arms (e.g., wheelchair or bedside chair)?: A Little Help needed to walk in hospital room?: A Little Help needed climbing 3-5 steps with a railing? : A Little 6 Click Score: 18    End of Session Equipment Utilized During Treatment: Gait belt Activity Tolerance: Patient tolerated treatment well Patient left: with call bell/phone within reach;in chair;with chair alarm set Nurse Communication: Mobility status PT Visit Diagnosis: Unsteadiness on feet (R26.81);Other abnormalities of gait and mobility (R26.89);Muscle weakness (generalized) (M62.81)     Time: 1610-9604 PT Time Calculation (min) (ACUTE ONLY): 26 min  Charges:  $Gait Training: 8-22 mins $Neuromuscular Re-education: 8-22 mins                     Erline Levine, PTA Acute Rehabilitation Services Pager: [phone] Office: [phone]     Carolynne Edouard 04/06/2020, 1:04 PM

## 2020-04-06 NOTE — Progress Notes (Signed)
STROKE TEAM PROGRESS NOTE   INTERVAL HISTORY Pt sitting in chair for lunch, neuro stable. No acute event overnight. Biopsy can not be scheduled this Friday. PT/OT recommend CIR.    OBJECTIVE Vitals:   04/05/20 2100 04/05/20 2321 04/06/20 0806 04/06/20 1231  BP: 135/64 123/72 (!) 150/72 124/77  Pulse: 64 73 67 98  Resp: (!) 22 19 (!) 23 18  Temp: 98 F (36.7 C) 97.6 F (36.4 C) 98.8 F (37.1 C) 97.9 F (36.6 C)  TempSrc: Oral Oral Oral Oral  SpO2: 92% 96% 92% 94%  Weight:      Height:        CBC:  Recent Labs  Lab 04/03/20 1004  WBC 8.1  NEUTROABS 4.8  HGB 13.3  HCT 41.3  MCV 91.0  PLT 188   Basic Metabolic Panel:  Recent Labs  Lab 04/03/20 1004  NA 142  K 3.8  CL 107  CO2 24  GLUCOSE 101*  BUN 10  CREATININE 0.48  CALCIUM 8.9   Lipid Panel:     Component Value Date/Time   CHOL 139 04/04/2020 0321   TRIG 98 04/04/2020 0321   HDL 37 (L) 04/04/2020 0321   CHOLHDL 3.8 04/04/2020 0321   VLDL 20 04/04/2020 0321   LDLCALC 82 04/04/2020 0321   HgbA1c:  Lab Results  Component Value Date   HGBA1C 6.0 (H) 04/04/2020   Urine Drug Screen:     Component Value Date/Time   LABOPIA NONE DETECTED 04/04/2020 0847   COCAINSCRNUR NONE DETECTED 04/04/2020 0847   LABBENZ NONE DETECTED 04/04/2020 0847   AMPHETMU NONE DETECTED 04/04/2020 0847   THCU NONE DETECTED 04/04/2020 0847   LABBARB NONE DETECTED 04/04/2020 0847    Alcohol Level     Component Value Date/Time   ETH <10 04/03/2020 1004    IMAGING  DG Chest 2 View 04/03/2020 IMPRESSION:  1. Low lung volumes with streaky basilar atelectasis.  2. Additional central vascular congestion, airways thickening, and few septal lines could reflect developing interstitial edema.  3. Nodular opacity in the right mid to lower lung, recommend further evaluation with CT imaging.    CT Head Wo Contrast 04/03/2020 IMPRESSION:  1.  No acute findings.  2. Evidence of previous coil embolization of multiple intracerebral  aneurysms as described.  3. Chronic ischemic microvascular disease. Possible old left cerebellar lacunar infarct as well as small old left occipital infarct.   CT CHEST WO CONTRAST 04/04/2020 IMPRESSION:  1. 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 The left upper lobe nodule is spiculated. Malignancy is the diagnosis of exclusion. Far less likely cavitating infection or septic emboli could be considered in the appropriate setting.  2. Pathologic mediastinal and hilar adenopathy worrisome for nodal metastases.  3. Nonspecific left adrenal nodule, metastatic disease not excluded.  4. Aortic Atherosclerosis (ICD10-I70.0) and Emphysema (ICD10-J43.9).  MR Brain W and Wo Contrast MR ANGIO HEAD WO CONTRAST MR ANGIO NECK WO CONTRAST 04/03/2020 IMPRESSION:  1. Multiple areas of acute and subacute infarct in the brain bilaterally suggestive of emboli.  2. Moderate chronic microvascular ischemic change in the white matter and pons.  3. Occlusion left A2 segment and mild stenosis right A2 segment. Hypoplastic right A1 segment.  4. Mild stenosis right P2 segment. Middle cerebral arteries patent bilaterally.    ECG - SR rate 73 BPM. (See cardiology reading for complete details)   PHYSICAL EXAM   Temp:  [97.6 F (36.4 C)-98.8 F (37.1 C)] 97.9 F (36.6 C) (06/29  1231) Pulse Rate:  [64-98] 98 (06/29 1231) Resp:  [18-23] 18 (06/29 1231) BP: (123-152)/(64-84) 124/77 (06/29 1231) SpO2:  [92 %-96 %] 94 % (06/29 1231)  General - Well nourished, well developed, in no apparent distress.  Ophthalmologic - fundi not visualized due to noncooperation.  Cardiovascular - Regular rhythm and rate, frequent PVCs on tele.  Mental Status -  Level of arousal and orientation to time, place, and person were intact. Language including expression, naming, repetition, comprehension was assessed and found intact. However, significant psychmotor slowing  Cranial Nerves II - XII - II -  Visual field intact OU. III, IV, VI - Extraocular movements intact. V - Facial sensation intact bilaterally. VII - right facial droop. VIII - Hearing & vestibular intact bilaterally. X - Palate elevates symmetrically. XI - Chin turning & shoulder shrug intact bilaterally. XII - Tongue protrusion intact.  Motor Strength - The patient's strength was normal in left UE and LE, however, right UE and LE 4/5 with mild pronator drift.  Bulk was normal and fasciculations were absent.   Motor Tone - Muscle tone was assessed at the neck and appendages and was normal.  Reflexes - The patient's reflexes were symmetrical in all extremities and she had no pathological reflexes.  Sensory - Light touch, temperature/pinprick were assessed and were symmetrical.    Coordination - The patient had normal movements in the hands with no ataxia or dysmetria, although slow action, more so on the right.  Tremor was absent.  Gait and Station - deferred.   ASSESSMENT/PLAN Ms. Jessica [name] is a 59 y.o. female with history of ongoing tobacco use, cerebral aneurysms (coiled), high cholesterol and previous stroke who presents with right-sided numbness / weakness which mostly resolved and tremor of right arm and leg.  She did not receive IV t-PA due to late presentation.  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 hypercoagulable due to malignancy  CT head -  No acute findings. 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 Chronic ischemic microvascular disease. Possible old left cerebellar lacunar infarct as well as small old left occipital infarct.   MRI head - Multiple areas of acute and subacute infarct in the brain bilaterally suggestive of systemic emboli.   MRA H&N - Occlusion left A2 segment and mild stenosis right A2 segment. Hypoplastic right A1 segment.   2D Echo EF 60-65%  LE venous doppler no DVT  May consider  TEE and/or loop if lung cancer diagnosis ruled out.  Sars Corona Virus 2 - negative  LDL - 82  HgbA1c - 6.0  UDS - neg  Hypercoagulable and autoimmune labs neg  VTE prophylaxis - SCDs  aspirin 81 mg daily prior to admission, now on ASA 81mg . Plavix on hold in preparation of biopsy.  Patient counseled to be compliant with her antithrombotic medications  Ongoing aggressive stroke risk factor management  Therapy recommendations:  CIR  Disposition:  Pending  ? Malignancy   Chest CT -  Left upper lobe and right middle lobe cavitating pulmonary nodules. Pathologic mediastinal and hilar adenopathy worrisome for nodal metastases. Nonspecific left adrenal nodule, metastatic disease not excluded.  Pt denies discomfort or weight loss  plavix on hold for 5 days before procedure  LE venous doppler neg for DVT  Pulmonary is scheduling lung biopsy to rule out lung malignancy  May consider MRI brain with contrast and CT abd/pelvis vs. PET in the meantime  Hypertension  Home BP  meds: none   Current BP meds: none   BP stable . Permissive hypertension (OK if < 220/120) but gradually normalize in 2-3 days  . Long-term BP goal normotensive  Hyperlipidemia  Home Lipid lowering medication: Lipitor 80 mg daily  LDL 82, goal < 70  Current lipid lowering medication: lipitor 80   Continue statin at discharge  Tobacco abuse  Current smoker  Smoking cessation counseling provided  Pt is willing to quit  Other Stroke Risk Factors  Advanced age  Hx stroke/TIA on imaging  Other Active Problems  Microscopic hematuria. Renal US neg.    Hospital day # 3  Marvel Plan, MD PhD Stroke Neurology 04/06/2020 2:40 PM   To contact Stroke Continuity provider, please refer to [email or web address]. After hours, contact General Neurology

## 2020-04-06 NOTE — Progress Notes (Signed)
PROGRESS NOTE    Jessica [name]  GYB:[id number] DOB: 07/03/1961 DOA: 04/03/2020 PCP: Patient, No Pcp Per  Outpatient Specialists:   Brief Narrative:  Patient is a 59 year old female with past medical history significant for hyperlipidemia, cerebral aneurysms status post coiling.  Patient presented with right-sided weakness and numbness.  MRI of the brain done on presentation revealed multiple areas of acute and subacute infarct bilateral suggestive of embolic stroke.  CT of the chest also revealed spiculated lung nodules worrisome for malignancy.  Pulmonary team was consulted, Plavix is on hold  as patient will need navigational EBUS for tissue diagnosis.  Input from the pulmonary and neurology team is highly appreciated.  Patient is awaiting discharge to CIR  ED Course:  Afeb, bp 166/78, hr 73, rr 21 , sat 95% on ra  Neuro exam non-focal no noted deficits"   CT scan:  1.No acute findings.  2. 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  3. Chronic ischemic microvascular disease. Possible old left cerebellar lacunar infarct as well as small old left occipital infarct.  MRI/MRA: 1. Multiple areas of acute and subacute infarct in the brain bilaterally suggestive of emboli. 2. Moderate chronic microvascular ischemic change in the white matter and pons. 3. Occlusion left A2 segment and mild stenosis right A2 segment. Hypoplastic right A1 segment. 4. Mild stenosis right P2 segment. Middle cerebral arteries patent Bilaterally.  CT chest without contrast revealed: 1. 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 The left upper lobe nodule is spiculated. Malignancy is the diagnosis of exclusion. Far less likely cavitating infection or septic emboli could be considered in the appropriate setting. 2. Pathologic mediastinal and hilar adenopathy worrisome for nodal metastases. 3. Nonspecific left adrenal nodule,  metastatic disease not excluded. 4. Aortic Atherosclerosis (ICD10-I70.0) and Emphysema (ICD10-J43.9).  04/04/2020: Patient seen alongside patient's sister and brother.  Patient is not particularly talkative.  Patient seemed slowed.  The history is that patient developed right-sided weakness at work.  MRI brain is as documented above. CT Chest is suggestive of possible malignancy.  UA reveals large Hemoglobin and greater than RBC. Will order renal ultrasound.  04/05/2020: Patient seen.  Input from neurology team is appreciated.  Pulmonary team is directing worrisome lung nodule work-up.  Plavix is on hold.  CIR has been recommended.  Will consult rehab team.  Patient was seen alongside patient's sister.  Patient and patient's sister updated.  04/06/2020: Patient seen.  No new changes.  Awaiting discharge to CIR.  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   -Continue aspirin.  -Permissive HTN, prn for bp >185/110 mmHg -Neurology is directing care. -CIR has been consulted.    Hyperlipidemia: -continue statin   Hypertension: -Gradually control BP after PERMISSIVE hypertension.    Abnormal chest x-ray and CT chest:  -CT chest is as documented above.   -Results concerning for possible malignancy.   -Pulmonary team's input is appreciated.   -Plavix is on hold for possible EBUS pending pulmonary team.   -Pulmonary also broached option of PET scan on outpatient basis (patient will likely be discharged to CIR).    Microscopic hematuria: -Renal ultrasound is nonrevealing.Marland Kitchen  DVT  prophylaxis: SCD Code Status: Full code  Family Communication: Brother and sister Disposition  Plan: This will depend on hospital course   Consultants:   Neurology.  Pulmonary.  Procedures:   None  Antimicrobials:   None  Subjective: No significant history from patient  Objective: Vitals:   04/05/20 2100 04/05/20 2321 04/06/20 0806 04/06/20 1231  BP: 135/64 123/72 (!) 150/72 124/77  Pulse: 64 73 67 98  Resp: (!) 22 19 (!) 23 18  Temp: 98 F (36.7 C) 97.6 F (36.4 C) 98.8 F (37.1 C) 97.9 F (36.6 C)  TempSrc: Oral Oral Oral Oral  SpO2: 92% 96% 92% 94%  Weight:      Height:        Intake/Output Summary (Last 24 hours) at 04/06/2020 1427 Last data filed at 04/06/2020 1233 Gross per 24 hour  Intake 480 ml  Output --  Net 480 ml   Filed Weights   04/03/20 0943  Weight: 73.8 kg    Examination:  General exam: More interactive today.  Appears calm and comfortable  Respiratory system: Clear to auscultation.  Cardiovascular system: S1 & S2 heard Gastrointestinal system: Abdomen is nondistended, soft and nontender. No organomegaly or masses felt. Normal bowel sounds heard. Central nervous system: Awake and alert.  Patient moves all extremities.  Right-sided weakness.  Extremities: No leg edema  Data Reviewed: I have personally reviewed following labs and imaging studies  CBC: Recent Labs  Lab 04/03/20 1004  WBC 8.1  NEUTROABS 4.8  HGB 13.3  HCT 41.3  MCV 91.0  PLT 188   Basic Metabolic Panel: Recent Labs  Lab 04/03/20 1004  NA 142  K 3.8  CL 107  CO2 24  GLUCOSE 101*  BUN 10  CREATININE 0.48  CALCIUM 8.9   GFR: Estimated Creatinine Clearance: 74.3 mL/min (by C-G formula based on SCr of 0.48 mg/dL). Liver Function Tests: Recent Labs  Lab 04/03/20 1004  AST 18  ALT 22  ALKPHOS 108  BILITOT 0.7  PROT 7.1  ALBUMIN 4.0   No results for input(s): LIPASE, AMYLASE in the last 168 hours. No results for input(s): AMMONIA in the last 168 hours. Coagulation Profile: Recent Labs  Lab 04/03/20 1004  INR 1.1   Cardiac Enzymes: No  results for input(s): CKTOTAL, CKMB, CKMBINDEX, TROPONINI in the last 168 hours. BNP (last 3 results) No results for input(s): PROBNP in the last 8760 hours. HbA1C: Recent Labs    04/04/20 0321  HGBA1C 6.0*   CBG: Recent Labs  Lab 04/03/20 0943  GLUCAP 93   Lipid Profile: Recent Labs    04/04/20 0321  CHOL 139  HDL 37*  LDLCALC 82  TRIG 98  CHOLHDL 3.8   Thyroid Function Tests: Recent Labs    04/04/20 0733  TSH 1.083   Anemia Panel: Recent Labs    04/04/20 0733  VITAMINB12 407   Urine analysis:    Component Value Date/Time   COLORURINE YELLOW 04/04/2020 0644   APPEARANCEUR CLEAR 04/04/2020 0644   LABSPEC 1.012 04/04/2020 0644   PHURINE 6.0 04/04/2020 0644   GLUCOSEU NEGATIVE 04/04/2020 0644   HGBUR LARGE (A) 04/04/2020 0644   BILIRUBINUR NEGATIVE 04/04/2020 0644   KETONESUR NEGATIVE 04/04/2020 0644   PROTEINUR NEGATIVE 04/04/2020 0644   NITRITE NEGATIVE 04/04/2020 0644   LEUKOCYTESUR NEGATIVE 04/04/2020 0644   Sepsis Labs: (procalcitonin:4,lacticidven:4)  ) Recent Results (from the past 240 hour(s))  SARS Coronavirus 2 by RT PCR (hospital order, performed in [facility] hospital lab) Nasopharyngeal Nasopharyngeal Swab  Status: None   Collection Time: 04/03/20  2:01 PM   Specimen: Nasopharyngeal Swab  Result Value Ref Range Status   SARS Coronavirus 2 NEGATIVE NEGATIVE Final    Comment: (NOTE) SARS-CoV-2 target nucleic acids are NOT DETECTED.  The SARS-CoV-2 RNA is generally detectable in upper and lower respiratory specimens during the acute phase of infection. The lowest concentration of SARS-CoV-2 viral copies this assay can detect is 250 copies / mL. A negative result does not preclude SARS-CoV-2 infection and should not be used as the sole basis for treatment or other patient management decisions.  A negative result may occur with improper specimen collection / handling, submission of specimen other than nasopharyngeal swab,  presence of viral mutation(s) within the areas targeted by this assay, and inadequate number of viral copies (<250 copies / mL). A negative result must be combined with clinical observations, patient history, and epidemiological information.  Fact Sheet for Patients:   BoilerBrush.com.cy  Fact Sheet for Healthcare Providers: https://pope.com/  This test is not yet approved or  cleared by the [place] FDA and has been authorized for detection and/or diagnosis of SARS-CoV-2 by FDA under an Emergency Use Authorization (EUA).  This EUA will remain in effect (meaning this test can be used) for the duration of the COVID-19 declaration under Section 564(b)(1) of the Act, 21 U.S.C. section 360bbb-3(b)(1), unless the authorization is terminated or revoked sooner.  Performed at [facility], [address] [place], [place] [address]          Radiology Studies: US RENAL  Result Date: 04/04/2020 CLINICAL DATA:  Hematuria EXAM: RENAL / URINARY TRACT ULTRASOUND COMPLETE COMPARISON:  CT dated March 15, 2015 FINDINGS: Right Kidney: Renal measurements: 11.2 x 5.1 x 4.4 cm = volume: 130 mL . Echogenicity within normal limits. No mass or hydronephrosis visualized. A small 1.4 cm cyst is noted. Left Kidney: Renal measurements: 10.2 x 5.1 x 4.7 cm = volume: 126 mL. Echogenicity within normal limits. No mass or hydronephrosis visualized. Bladder: Appears normal for degree of bladder distention. Other: None. IMPRESSION: Unremarkable study. Electronically Signed   By: Katherine Mantle M.D.   On: 04/04/2020 21:46   ECHOCARDIOGRAM COMPLETE  Result Date: 04/04/2020    ECHOCARDIOGRAM REPORT   Patient Name:   TARYN SHELLHAMMER Date of Exam: 04/04/2020 Medical Rec #:  [id number]    Height:       65.0 in Accession #:    [id number]   Weight:       162.8 lb Date of Birth:  Jan 19, 1961    BSA:          1.812 m Patient Age:    59 years     BP:           162/87  mmHg Patient Gender: F            HR:           74 bpm. Exam Location:  Inpatient Procedure: 2D Echo, Color Doppler, Cardiac Doppler and Intracardiac            Opacification Agent Indications:    TIA  History:        Patient has no prior history of Echocardiogram examinations.  Sonographer:    Roosvelt Maser RDCS Referring Phys: [id number] SARA-MAIZ A THOMAS IMPRESSIONS  1. Left ventricular ejection fraction, by estimation, is 60 to 65%. The left ventricle has normal function. The left ventricle has no regional wall motion abnormalities. Left ventricular diastolic parameters are consistent  with Grade I diastolic dysfunction (impaired relaxation).  2. Right ventricular systolic function is normal. The right ventricular size is normal.  3. The mitral valve is normal in structure. No evidence of mitral valve regurgitation. No evidence of mitral stenosis.  4. The aortic valve is normal in structure. Aortic valve regurgitation is mild. No aortic stenosis is present.  5. The inferior vena cava is normal in size with greater than 50% respiratory variability, suggesting right atrial pressure of 3 mmHg. FINDINGS  Left Ventricle: Left ventricular ejection fraction, by estimation, is 60 to 65%. The left ventricle has normal function. The left ventricle has no regional wall motion abnormalities. Definity contrast agent was given IV to delineate the left ventricular  endocardial borders. The left ventricular internal cavity size was normal in size. There is no left ventricular hypertrophy. Left ventricular diastolic parameters are consistent with Grade I diastolic dysfunction (impaired relaxation). Normal left ventricular filling pressure. Right Ventricle: The right ventricular size is normal. No increase in right ventricular wall thickness. Right ventricular systolic function is normal. Left Atrium: Left atrial size was normal in size. Right Atrium: Right atrial size was normal in size. Pericardium: There is no evidence of  pericardial effusion. Mitral Valve: The mitral valve is normal in structure. Normal mobility of the mitral valve leaflets. No evidence of mitral valve regurgitation. No evidence of mitral valve stenosis. Tricuspid Valve: The tricuspid valve is normal in structure. Tricuspid valve regurgitation is not demonstrated. No evidence of tricuspid stenosis. Aortic Valve: The aortic valve is normal in structure. Aortic valve regurgitation is mild. No aortic stenosis is present. Pulmonic Valve: The pulmonic valve was normal in structure. Pulmonic valve regurgitation is not visualized. No evidence of pulmonic stenosis. Aorta: The aortic root is normal in size and structure. Venous: The inferior vena cava is normal in size with greater than 50% respiratory variability, suggesting right atrial pressure of 3 mmHg. IAS/Shunts: No atrial level shunt detected by color flow Doppler.  LEFT VENTRICLE PLAX 2D LVIDd:         4.30 cm  Diastology LVIDs:         2.80 cm  LV e' lateral:   6.31 cm/s LV PW:         0.80 cm  LV E/e' lateral: 8.7 LV IVS:        0.80 cm  LV e' medial:    5.00 cm/s LVOT diam:     1.90 cm  LV E/e' medial:  11.0 LVOT Area:     2.84 cm  RIGHT VENTRICLE          IVC RV Basal diam:  2.80 cm  IVC diam: 1.20 cm LEFT ATRIUM           Index       RIGHT ATRIUM           Index LA diam:      2.20 cm 1.21 cm/m  RA Area:     16.70 cm LA Vol (A4C): 68.6 ml 37.85 ml/m RA Volume:   45.60 ml  25.16 ml/m   AORTA Ao Root diam: 2.80 cm Ao Asc diam:  2.90 cm MITRAL VALVE MV Area (PHT): 4.31 cm    SHUNTS MV Decel Time: 176 msec    Systemic Diam: 1.90 cm MV E velocity: 54.80 cm/s MV A velocity: 79.70 cm/s MV E/A ratio:  0.69 Mihai Croitoru MD Electronically signed by Thurmon Fair MD Signature Date/Time: 04/04/2020/5:21:05 PM    Final    VAS [place] LOWER  EXTREMITY VENOUS (DVT)  Result Date: 04/04/2020  Lower Venous DVTStudy Indications: Stroke.  Limitations: Positioning. Comparison Study: No prior study on file Performing  Technologist: Sherren Kernsandace Kanady RVS  Examination Guidelines: A complete evaluation includes B-mode imaging, spectral Doppler, color Doppler, and power Doppler as needed of all accessible portions of each vessel. Bilateral testing is considered an integral part of a complete examination. Limited examinations for reoccurring indications may be performed as noted. The reflux portion of the exam is performed with the patient in reverse Trendelenburg.  +---------+---------------+---------+-----------+----------+--------------+ RIGHT    CompressibilityPhasicitySpontaneityPropertiesThrombus Aging +---------+---------------+---------+-----------+----------+--------------+ CFV      Full           Yes      Yes                                 +---------+---------------+---------+-----------+----------+--------------+ SFJ      Full                                                        +---------+---------------+---------+-----------+----------+--------------+ FV Prox  Full                                                        +---------+---------------+---------+-----------+----------+--------------+ FV Mid   Full                                                        +---------+---------------+---------+-----------+----------+--------------+ FV DistalFull                                                        +---------+---------------+---------+-----------+----------+--------------+ PFV      Full                                                        +---------+---------------+---------+-----------+----------+--------------+ POP      Full           Yes      Yes                                 +---------+---------------+---------+-----------+----------+--------------+ PTV      Full                                                        +---------+---------------+---------+-----------+----------+--------------+ PERO     Full                                                         +---------+---------------+---------+-----------+----------+--------------+   +---------+---------------+---------+-----------+----------+--------------+  LEFT     CompressibilityPhasicitySpontaneityPropertiesThrombus Aging +---------+---------------+---------+-----------+----------+--------------+ CFV      Full           Yes      Yes                                 +---------+---------------+---------+-----------+----------+--------------+ SFJ      Full                                                        +---------+---------------+---------+-----------+----------+--------------+ FV Prox  Full                                                        +---------+---------------+---------+-----------+----------+--------------+ FV Mid   Full                                                        +---------+---------------+---------+-----------+----------+--------------+ FV DistalFull                                                        +---------+---------------+---------+-----------+----------+--------------+ PFV      Full                                                        +---------+---------------+---------+-----------+----------+--------------+ POP      Full           Yes      Yes                                 +---------+---------------+---------+-----------+----------+--------------+ PTV      Full                                                        +---------+---------------+---------+-----------+----------+--------------+ PERO     Full                                                        +---------+---------------+---------+-----------+----------+--------------+     Summary: BILATERAL: - No evidence of deep vein thrombosis seen in the lower extremities, bilaterally. -   *See table(s) above for measurements and observations. Electronically signed by Waverly Ferrari MD on 04/04/2020 at 6:48:44 PM.    Final  Scheduled Meds: . aspirin EC  81 mg Oral Daily  . atorvastatin  80 mg Oral QHS  . umeclidinium bromide  1 puff Inhalation Daily   Continuous Infusions:   LOS: 3 days    Time spent: 25 minutes.    Berton Mount, MD  [facility] Pager #: [phone] 7PM-7AM contact night coverage as above

## 2020-04-06 NOTE — Progress Notes (Signed)
NAME:  Jessica [name], MRN:  [id number], DOB:  1961-04-06, LOS: 3 ADMISSION DATE:  04/03/2020, CONSULTATION DATE: 04/04/2020 REFERRING MD: Dartha Lodge, CHIEF COMPLAINT: Cavitating lung nodule   History of present illness   History obtained from the chart and talking to the husband and also neurologist  59 year old female history of hyperlipidemia, chronic heavy smoking, history of cerebral aneurysms status post coiling.  On April 04, 2019 when she presented with right-sided weakness and CT/MRI evidence of multiple areas of acute and subacute infarct in the brain suggestive of emboli and occlusion of the left A2 segment which was considered the most recent symptomatic event.  She was loaded on Plavix 300 mg on April 03, 2020 and since then has been on 75 mg once daily.  Also started on aspirin 81 mg once daily on the day of admission.  Overall she has improved although the significant slowness in speech and mild right-sided deficit.  During the stay she underwent CT scan of the chest that shows left upper lobe 3.3 cm spiculated mass with peripheral cavitation and right middle lobe cavitating pulmonary 2 cm mass in the lateral segment with cavitation and an indeterminate 10 mm nodule in the left adrenal gland.  There was also pathologic enlargement of mediastinal adenopathy in the precarinal and the subcarinal and hilar areas [noted noncontrasted CT] and therefore pulmonary has been consulted.  CT scan also showed background evidence of emphysema.    Patient husband reports history of cough for the last 8 months early in the morning when she smokes a cigarette.  Endorses that she been smoking cigarettes since age 33.  Otherwise no autoimmune disease or vasculitis.  No hemoptysis no weight loss.  No shortness of breath or wheezing no edema no proximal nocturnal dyspnea  According to the neurologist okay to stop Plavix for procedure if needed.  Past Medical History   has a past medical history of High  cholesterol and Stroke (HCC).   reports that she has been smoking cigarettes. She has never used smokeless tobacco.   Significant Hospital Events   none  Consults:  pccm neurology  Procedures:    Significant Diagnostic Tests:  CT chest IMPRESSION: 1. 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 The left upper lobe nodule is spiculated. Malignancy is the diagnosis of exclusion. Far less likely cavitating infection or septic emboli could be considered in the appropriate setting. 2. Pathologic mediastinal and hilar adenopathy worrisome for nodal metastases. 3. Nonspecific left adrenal nodule, metastatic disease not excluded. 4. Aortic Atherosclerosis (ICD10-I70.0) and Emphysema (ICD10-J43.9).   Micro Data:  None  Antimicrobials:     Interim history/subjective:  No overnight events Feels fine, muscle strengtht improving Denies a cough, no CP  Objective   Blood pressure 124/77, pulse 98, temperature 97.9 F (36.6 C), temperature source Oral, resp. rate 18, height 5\' 5"  (1.651 m), weight 73.8 kg, SpO2 94 %.        Intake/Output Summary (Last 24 hours) at 04/06/2020 1635 Last data filed at 04/06/2020 1233 Gross per 24 hour  Intake 480 ml  Output --  Net 480 ml   Filed Weights   04/03/20 0943  Weight: 73.8 kg    Examination: General: Middle-aged lady, does not appear to be in distress HENT: Moist oral mucosa Lungs: decreased Ae bases bilaterally Cardiovascular: S1-S2 appreciated Abdomen: No sounds appreciated  Resolved Hospital Problem list     Assessment & Plan:  CVA -Multiple infarcts on MRI  Abnormal CT scan  of the chest showing left lung nodule, cavitating nodule Concern for neoplastic process Hilar and mediastinal adenopathy Concern for lung cancer  - needs biopsy - biopsy may have to be scheduled as outpatient  Patient can be scheduled to see Dr Mquaid/Icard as outpatient, may also follow up with APP in the office to  facilitate scheduling NAV/EBUS bronchoscopy  Chronic obstructive pulmonary disease -Continue bronchodilator treatments  Plavix on hold -This may hold for about 5 days - may restart plavix- this needs held for about 5 days prior to bronchoscopy  Smoking cessation counseling  We will follow up with when bronchoscopy can be scheduled-navigational bronchoscopy/EBUS  Virl Diamond, MD [facility] Pager: [phone]  Addendum: Called centralized scheduling to try and schedule bronchoscopy for Friday Cannot schedule for Friday as there are no openings.04/05/2020  We should go forward with planning for PET scan and plan biopsy accordingly depending on findings on PET scan  Virl Diamond, MD [place] Pager: [phone]

## 2020-04-07 ENCOUNTER — Encounter (HOSPITAL_COMMUNITY): Payer: Self-pay | Admitting: Physical Medicine & Rehabilitation

## 2020-04-07 ENCOUNTER — Inpatient Hospital Stay (HOSPITAL_COMMUNITY)
Admission: RE | Admit: 2020-04-07 | Discharge: 2020-04-15 | DRG: 057 | Disposition: A | Discharge status: Home / Self Care | Payer: PRIVATE HEALTH INSURANCE | Source: Intra-hospital | Attending: Physical Medicine & Rehabilitation | Admitting: Physical Medicine & Rehabilitation

## 2020-04-07 ENCOUNTER — Other Ambulatory Visit: Payer: Self-pay

## 2020-04-07 DIAGNOSIS — Z720 Tobacco use: Secondary | ICD-10-CM

## 2020-04-07 DIAGNOSIS — I6349 Cerebral infarction due to embolism of other cerebral artery: Secondary | ICD-10-CM | POA: Diagnosis not present

## 2020-04-07 DIAGNOSIS — K5900 Constipation, unspecified: Secondary | ICD-10-CM | POA: Diagnosis present

## 2020-04-07 DIAGNOSIS — Z7982 Long term (current) use of aspirin: Secondary | ICD-10-CM | POA: Diagnosis not present

## 2020-04-07 DIAGNOSIS — F17210 Nicotine dependence, cigarettes, uncomplicated: Secondary | ICD-10-CM | POA: Diagnosis present

## 2020-04-07 DIAGNOSIS — I69351 Hemiplegia and hemiparesis following cerebral infarction affecting right dominant side: Secondary | ICD-10-CM | POA: Diagnosis present

## 2020-04-07 DIAGNOSIS — I63111 Cerebral infarction due to embolism of right vertebral artery: Secondary | ICD-10-CM

## 2020-04-07 DIAGNOSIS — Z79899 Other long term (current) drug therapy: Secondary | ICD-10-CM

## 2020-04-07 DIAGNOSIS — J984 Other disorders of lung: Secondary | ICD-10-CM | POA: Diagnosis present

## 2020-04-07 DIAGNOSIS — I6340 Cerebral infarction due to embolism of unspecified cerebral artery: Secondary | ICD-10-CM | POA: Diagnosis present

## 2020-04-07 DIAGNOSIS — R918 Other nonspecific abnormal finding of lung field: Secondary | ICD-10-CM

## 2020-04-07 DIAGNOSIS — I63413 Cerebral infarction due to embolism of bilateral middle cerebral arteries: Secondary | ICD-10-CM

## 2020-04-07 DIAGNOSIS — G8191 Hemiplegia, unspecified affecting right dominant side: Secondary | ICD-10-CM

## 2020-04-07 DIAGNOSIS — R911 Solitary pulmonary nodule: Secondary | ICD-10-CM | POA: Diagnosis present

## 2020-04-07 DIAGNOSIS — Z716 Tobacco abuse counseling: Secondary | ICD-10-CM

## 2020-04-07 DIAGNOSIS — E785 Hyperlipidemia, unspecified: Secondary | ICD-10-CM | POA: Diagnosis present

## 2020-04-07 DIAGNOSIS — K5901 Slow transit constipation: Secondary | ICD-10-CM

## 2020-04-07 DIAGNOSIS — N6489 Other specified disorders of breast: Secondary | ICD-10-CM | POA: Diagnosis present

## 2020-04-07 DIAGNOSIS — R590 Localized enlarged lymph nodes: Secondary | ICD-10-CM | POA: Diagnosis present

## 2020-04-07 DIAGNOSIS — Z90710 Acquired absence of both cervix and uterus: Secondary | ICD-10-CM

## 2020-04-07 LAB — QUANTIFERON-TB GOLD PLUS (RQFGPL)
QuantiFERON Mitogen Value: >10 IU/mL
QuantiFERON Nil Value: 0.02 IU/mL
QuantiFERON TB1 Ag Value: 0.04 IU/mL
QuantiFERON TB2 Ag Value: 0.03 IU/mL

## 2020-04-07 LAB — QUANTIFERON-TB GOLD PLUS: QuantiFERON-TB Gold Plus: NEGATIVE

## 2020-04-07 MED ORDER — ASPIRIN EC 81 MG PO TBEC
81.0000 mg | DELAYED_RELEASE_TABLET | Freq: Every day | ORAL | Status: DC
  Administered 2020-04-08 – 2020-04-15 (×8): 81 mg via ORAL
  Filled 2020-04-07 (×8): qty 1

## 2020-04-07 MED ORDER — SENNOSIDES-DOCUSATE SODIUM 8.6-50 MG PO TABS
1.0000 | ORAL_TABLET | Freq: Every evening | ORAL | Status: DC | PRN
  Administered 2020-04-09: 1 via ORAL
  Filled 2020-04-07: qty 1

## 2020-04-07 MED ORDER — SORBITOL 70 % SOLN
30.0000 mL | Freq: Every day | Status: DC | PRN
  Administered 2020-04-09: 30 mL via ORAL
  Filled 2020-04-07 (×2): qty 30

## 2020-04-07 MED ORDER — UMECLIDINIUM BROMIDE 62.5 MCG/INH IN AEPB
1.0000 | INHALATION_SPRAY | Freq: Every day | RESPIRATORY_TRACT | Status: DC
  Administered 2020-04-08 – 2020-04-15 (×7): 1 via RESPIRATORY_TRACT
  Filled 2020-04-07: qty 7

## 2020-04-07 MED ORDER — ALBUTEROL SULFATE (2.5 MG/3ML) 0.083% IN NEBU: 2.5000 mg | INHALATION_SOLUTION | Freq: Four times a day (QID) | RESPIRATORY_TRACT | Status: DC | PRN

## 2020-04-07 MED ORDER — UMECLIDINIUM BROMIDE 62.5 MCG/INH IN AEPB: 1.0000 | INHALATION_SPRAY | Freq: Every day | RESPIRATORY_TRACT | Status: DC

## 2020-04-07 MED ORDER — ATORVASTATIN CALCIUM 80 MG PO TABS
80.0000 mg | ORAL_TABLET | Freq: Every day | ORAL | Status: DC
  Administered 2020-04-07 – 2020-04-14 (×8): 80 mg via ORAL
  Filled 2020-04-07 (×8): qty 1

## 2020-04-07 MED ORDER — ACETAMINOPHEN 650 MG RE SUPP: 650.0000 mg | RECTAL | Status: DC | PRN

## 2020-04-07 MED ORDER — ALBUTEROL SULFATE (2.5 MG/3ML) 0.083% IN NEBU: 2.5000 mg | INHALATION_SOLUTION | Freq: Four times a day (QID) | RESPIRATORY_TRACT | 12 refills | Status: DC | PRN

## 2020-04-07 MED ORDER — ACETAMINOPHEN 325 MG PO TABS: 650.0000 mg | ORAL_TABLET | ORAL | Status: DC | PRN

## 2020-04-07 MED ORDER — ACETAMINOPHEN 160 MG/5ML PO SOLN: 650.0000 mg | ORAL | Status: DC | PRN

## 2020-04-07 NOTE — TOC Transition Note (Signed)
Transition of Care [facility]) - CM/SW Discharge Note   Patient Details  Name: Jessica [name] MRN: [id number] Date of Birth: Aug 10, 1961  Transition of Care [facility]) CM/SW Contact:  Kermit Balo, RN Phone Number: 04/07/2020, 12:55 PM   Clinical Narrative:    Pt is discharging to CIR today. CM signing off.   Final next level of care: IP Rehab Facility Barriers to Discharge: No Barriers Identified   Patient Goals and CMS Choice        Discharge Placement                       Discharge Plan and Services                                     Social Determinants of Health (SDOH) Interventions     Readmission Risk Interventions No flowsheet data found.

## 2020-04-07 NOTE — H&P (Signed)
Physical Medicine and Rehabilitation Admission H&P    Chief Complaint  Patient presents with  . 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  History taken from chart review and patient.  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  Presented on 04/03/20 with right leg hemiparesis and numbness.  CT/MRI as well as MRA of the head neck showed multiple areas of acute and subacute bilateral infarcts suggestive of embolic source.  Moderate chronic microvascular ischemic change in the white matter and pons.  Occlusion left A2 segment and mild stenosis right A2 segment.  Patient did not receive TPA.  Echocardiogram with ejection fraction of 65% with grade 1 diastolic dysfunction and no regional wall motion abnormalities.  Chest CT showed a left upper lobe and right middle lobe cavitating pulmonary nodules, pathologic mediastinal and hilar adenopathy worrisome for nodal metastasis.  Admission chemistries unremarkable except BNP 144 urinalysis negative nitrite.  Initially maintained on aspirin Plavix for CVA prophylaxis.  Plan for outpatient biopsy of pulmonary nodules.  Plavix currently remains on hold with anticipation of biopsy.  Patient is tolerating a regular diet.  Therapy evaluations completed and patient was admitted for a comprehensive rehab program.  Please see preadmission assessment from earlier today as well.  Review of Systems  Constitutional: Negative for chills and fever.  HENT: Negative for hearing loss.   Eyes: Negative for blurred vision and double vision.  Cardiovascular: Negative for chest pain and palpitations.  Gastrointestinal: Positive for constipation. Negative for heartburn, nausea and vomiting.  Genitourinary: Negative for dysuria, flank pain and hematuria.  Musculoskeletal: Positive for myalgias.  Skin: Negative  for rash.  Neurological: Positive for focal weakness and weakness. Negative for sensory change and speech change.  All other systems reviewed and are negative.  Past Medical History:  Diagnosis Date  . High cholesterol   . Stroke [facility])    Past Surgical History:  Procedure Laterality Date  . ABDOMINAL HYSTERECTOMY     No pertinent family history of premature CVA. Social History:  reports that she has been smoking cigarettes. She has never used smokeless tobacco. She reports that she does not drink alcohol and does not use drugs. Allergies: No Known Allergies Medications Prior to Admission  Medication Sig Dispense Refill  . aspirin EC 81 MG tablet Take 81 mg by mouth daily. Swallow whole.    Marland Kitchen atorvastatin (LIPITOR) 80 MG tablet Take 80 mg by mouth at bedtime.     Marland Kitchen oxyCODONE-acetaminophen (PERCOCET/ROXICET) 5-325 MG per tablet Take 1-2 tablets by mouth every 6 (six) hours as needed for pain. (Patient not taking: Reported on 04/03/2020) 15 tablet 0    Drug Regimen Review Drug regimen was reviewed and remains appropriate with no significant issues identified  Home: Home Living Family/patient expects to be discharged to:: Private residence Living Arrangements: Spouse/significant other Available Help at Discharge: Family, Available 24 hours/day Type of Home: House Home Access: Level entry Home Layout: One level Bathroom Shower/Tub: Engineer, manufacturing systems: Standard Home Equipment: None  Lives With: Significant other   Functional History: Prior Function Level of Independence: Independent 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  Functional Status:  Mobility: Bed Mobility Overal bed mobility: Modified Independent Bed Mobility: Supine to Sit Supine to sit: Supervision Sit to supine: Supervision General bed mobility comments: use of rail and increased time/effort  Transfers Overall  transfer level: Needs assistance Equipment used: None Transfers:  Sit to/from Stand Sit to Stand: Min guard, Min assist General transfer comment: min guard to stand and min A in standing due to posterior bias; no overt LOB Ambulation/Gait Ambulation/Gait assistance: Min assist Gait Distance (Feet): 200 Feet Assistive device:  (assist at trunk with gait belt) Gait Pattern/deviations: Step-through pattern, Decreased stride length, Narrow base of support, Drifts right/left General Gait Details: assistance required for balance; LOB with turning to R side and with horizontal head turns; pt runs into objects on R side and requires cues throughout to attend to R side  Gait velocity: decreased Stairs: Yes Stairs assistance: Min assist Stair Management: No rails, Alternating pattern, One rail Left, Step to pattern, Forwards Number of Stairs: 2 General stair comments: pt initially felt she ascend steps without rail however unsteady and hit R toes on step; rail used for increased stability and cues for step to pattern     ADL: ADL Overall ADL's : Needs assistance/impaired Grooming: Oral care, Standing, Min guard Grooming Details (indicate cue type and reason): Min guard for safety due to generalized weakness Upper Body Bathing: Sitting, Supervision/ safety Upper Body Bathing Details (indicate cue type and reason): supervision for safety Lower Body Bathing: Minimal assistance, Sitting/lateral leans Lower Body Bathing Details (indicate cue type and reason): Min A for LOB and lean to L with reaching for feet Upper Body Dressing : Supervision/safety, Sitting Upper Body Dressing Details (indicate cue type and reason): Supervision for safety Lower Body Dressing: Minimal assistance, Sitting/lateral leans Lower Body Dressing Details (indicate cue type and reason): Min A for LOB and lean to L with donning socks Toilet Transfer: Min guard, Cueing for safety, Ambulation Toilet Transfer Details (indicate cue type and reason): Min guard for safety simulated to  reclienr Toileting- Architect and Hygiene: Min guard, Sit to/from stand Toileting - Clothing Manipulation Details (indicate cue type and reason): Min guard for safety Functional mobility during ADLs: Minimal assistance, Supervision/safety, Cueing for safety General ADL Comments: Min A - Supervision for safety with ADLs due to lack of cognition, decreased coordination/strength with R UE, and deficits in balance  Cognition: Cognition Overall Cognitive Status: No family/caregiver present to determine baseline cognitive functioning Arousal/Alertness: Awake/alert Orientation Level: Oriented X4 Attention: Focused, Sustained Focused Attention: Appears intact Sustained Attention: Impaired Sustained Attention Impairment: Verbal complex Memory: Impaired Memory Impairment: Retrieval deficit, Decreased recall of new information (Immediate: 4/5; delayed: 1/5; with cues: 4/4) Awareness: Impaired Awareness Impairment: Emergent impairment Problem Solving: Impaired Problem Solving Impairment: Verbal complex Executive Function: Reasoning, Organizing Reasoning: Impaired Reasoning Impairment: Verbal complex Organizing: Impaired Organizing Impairment: Verbal complex (Clock drawing: 0/4) Cognition Arousal/Alertness: Awake/alert Behavior During Therapy: Flat affect Overall Cognitive Status: No family/caregiver present to determine baseline cognitive functioning Area of Impairment: Problem solving Current Attention Level: Sustained Following Commands: Follows one step commands consistently Safety/Judgement: Decreased awareness of safety, Decreased awareness of deficits Awareness: Emergent Problem Solving: Decreased initiation, Slow processing, Requires verbal cues General Comments: grossly flat affect but smiling appropriately and able to converse appropriately; slow processing and decreased safety awareness   Physical Exam: Blood pressure 129/90, pulse 63, temperature 98.3 F (36.8 C),  temperature source Oral, resp. rate 18, height 5\' 5"  (1.651 m), weight 73.8 kg, SpO2 92 %. Physical Exam Vitals reviewed.  Constitutional:      General: She is not in acute distress.    Appearance: She is normal weight.  HENT:     Head: Normocephalic and atraumatic.     Right  Ear: External ear normal.     Left Ear: External ear normal.     Nose: Nose normal.  Eyes:     General:        Right eye: No discharge.        Left eye: No discharge.     Extraocular Movements: Extraocular movements intact.  Pulmonary:     Effort: Pulmonary effort is normal. No respiratory distress.     Breath sounds: No stridor.  Abdominal:     General: Abdomen is flat. There is no distension.  Musculoskeletal:     Cervical back: Neck supple.     Comments: Motor: RUE/RLE: 3/5 proximal distal LUE/LLE: 5/5 proximal distal Sensation intact light touch  Skin:    General: Skin is warm and dry.  Neurological:     Mental Status: She is alert.     Comments: Patient is alert in no acute distress.  Oriented x3 and follows commands.  Fair awareness of deficits.  Psychiatric:        Mood and Affect: Affect is blunt and flat.     Comments: ?  Some delay in speech     No results found for this or any previous visit (from the past 48 hour(s)). No results found.     Medical Problem List and Plan: 1.  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  Admit to CIR 2.  Antithrombotics: -DVT/anticoagulation: SCDs  -antiplatelet therapy: Aspirin 81 mg daily.  Plavix on hold for planned outpatient biopsy and bronchoscopy 3. Pain Management: Tylenol as needed 4. Mood: Provide emotional support  -antipsychotic agents: N/A 5. Neuropsych: This patient is capable of making decisions on her own behalf. 6. Skin/Wound Care: Routine skin checks 7. Fluids/Electrolytes/Nutrition: Routine in and outs.  CMP ordered. 8.  Left  upper lobe and right middle lobe cavitating pulmonary nodules. Plan outpatient biopsy and bronchoscopy 9.  Tobacco abuse.  Counsel 10.  Hyperlipidemia.  Lipitor 11.  Constipation.  Adjust bowel meds as necessary  Charlton Amor, PA-C 04/07/2020  I have personally performed a face to face diagnostic evaluation, including, but not limited to relevant history and physical exam findings, of this patient and developed relevant assessment and plan.  Additionally, I have reviewed and concur with the physician assistant's documentation above.  Maryla Morrow, MD, ABPMR

## 2020-04-07 NOTE — Plan of Care (Signed)
Adequate for discharge to CIR.

## 2020-04-07 NOTE — Progress Notes (Signed)
Jessica Fennel, MD  Physician  Physical Medicine and Rehabilitation  PMR Pre-admission      Addendum  Date of Service:  04/07/2020 12:26 PM      Related encounter: ED to Hosp-Admission (Discharged) from 04/03/2020 in [facility]       Show:Clear all [x] Manual[x] Template[x] Copied  Added by: [x] , RN[x] , MD  [] Hover for details PMR Admission Coordinator Pre-Admission Assessment   Patient: Jessica [name] is an 59 y.o., female MRN: DOB: November 09, 1960 Height: 5\' 5"  (165.1 cm) Weight: 73.8 kg                                                                                                                                                  Insurance Information HMO:     PPO: yes     PCP:      IPA:      80/20:      OTHER:  PRIMARY: [employer]      Policy#: [id number]      Subscriber: pt CM Name: [id number]  Phone#: [phone] option 1 extension [phone]     Fax#: Pre-Cert#: [id number] approved for 7 days      Employer: [employer] [facility] Benefits:  Phone #: [phone]     Name: 6/29 Eff. Date: 07/09/2014     Deduct: $750      Out of Pocket Max: $3500      Life Max: none  CIR: 80%      SNF: 80% 90 days per year Outpatient: $40 per visit     Co-Pay: visits per medical neccesity Home Health: 80%      Co-Pay: 100 days per year DME: 80%     Co-Pay: 205 Providers: in network  SECONDARY: none      Policy#:       Phone#:    [facility]:       Phone#:    The [phone]" for patients in Inpatient Rehabilitation Facilities with attached "Privacy Act Statement-Health Care Records" was provided and verbally reviewed with: N/A   Emergency Contact Information Contact Information       Name Relation Home Work Mobile    Jerger,Armond Spouse [phone]             Current Medical History  Patient Admitting Diagnosis: CVA   History of Present Illness:  Jessica [name] is a 59 year old right-handed female  history of hyperlipidemia, tobacco abuse and 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   Presented on 04/03/20 with right leg hemiparesis and numbness.  CT/MRI as well as MRA of the head neck showed multiple areas of acute and subacute bilateral infarcts suggestive of embolic source.  Moderate chronic microvascular ischemic change in the white matter and pons.  Occlusion left A2 segment and mild stenosis right A2 segment.  Patient did not receive TPA.  Echocardiogram with ejection fraction of 65% with grade 1 diastolic dysfunction and no regional wall motion abnormalities.  Chest CT showed a left upper lobe and right middle lobe cavitating pulmonary nodules, pathologic mediastinal and hilar adenopathy worrisome for nodal metastasis.  Admission chemistries unremarkable except BNP 144 urinalysis negative nitrite.  Initially maintained on aspirin Plavix for CVA prophylaxis.  Plan for outpatient biopsy of pulmonary nodules.  Plavix currently remains on hold with anticipation of biopsy.  Patient is tolerating a regular diet.      Complete NIHSS TOTAL: 1   Past Medical History      Past Medical History:  Diagnosis Date  . High cholesterol    . Stroke [facility](HCC)        Family History  family history is not on file.   Prior Rehab/Hospitalizations:  Has the patient had prior rehab or hospitalizations prior to admission? Yes   Has the patient had major surgery during 100 days prior to admission? No   Current Medications    Current Facility-Administered Medications:  .  acetaminophen (TYLENOL) tablet 650 mg, 650 mg, Oral, Q4H PRN **OR** acetaminophen (TYLENOL) 160 MG/5ML solution 650 mg, 650 mg, Per Tube, Q4H PRN **OR** acetaminophen (TYLENOL) suppository 650 mg, 650 mg, Rectal, Q4H PRN, Skip Mayerhomas, Sara-Maiz A, MD .  albuterol (PROVENTIL) (2.5 MG/3ML) 0.083% nebulizer solution 2.5 mg, 2.5 mg, Nebulization, Q6H PRN, Berton Mountgbata, Sylvester I, MD .  aspirin EC tablet 81 mg, 81 mg, Oral, Daily, Rejeana BrockKirkpatrick, McNeill P, MD, 81  mg at 04/07/20 1103 .  atorvastatin (LIPITOR) tablet 80 mg, 80 mg, Oral, QHS, Marvel PlanXu, Jindong, MD, 80 mg at 04/06/20 2143 .  senna-docusate (Senokot-S) tablet 1 tablet, 1 tablet, Oral, QHS PRN, Skip Mayerhomas, Sara-Maiz A, MD .  umeclidinium bromide (INCRUSE ELLIPTA) 62.5 MCG/INH 1 puff, 1 puff, Inhalation, Daily, Berton Mountgbata, Sylvester I, MD, 1 puff at 04/06/20 0853   Patients Current Diet:  Diet Order                  Diet Heart Room service appropriate? 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  Independent   Indoor Mobility: Did the patient need assistance with walking from room to room (with or without device)? Independent   Stairs: Did the patient need assistance with internal or external stairs (with or without device)? Independent   Functional Cognition: Did the patient need help planning regular tasks such as shopping or remembering to take medications? Independent   Home Assistive Devices / Equipment Home Assistive Devices/Equipment: None, Eyeglasses, Dentures (specify type) Home Equipment: None   Prior Device Use: Indicate devices/aids used by the patient prior to current illness, exacerbation or injury? None of the above   Current Functional Level Cognition   Arousal/Alertness: Awake/alert Overall Cognitive Status: No family/caregiver present to determine baseline cognitive functioning Current Attention Level:  Sustained Orientation Level: Oriented X4  Following Commands: Follows one step commands consistently Safety/Judgement: Decreased awareness of safety, Decreased awareness of deficits General Comments: grossly flat affect but smiling appropriately and able to converse appropriately; slow processing and decreased safety awareness  Attention: Focused, Sustained Focused Attention: Appears intact Sustained Attention: Impaired Sustained Attention Impairment: Verbal complex Memory: Impaired Memory Impairment: Retrieval deficit, Decreased recall of new information (Immediate: 4/5; delayed: 1/5; with cues: 4/4) Awareness: Impaired Awareness Impairment: Emergent impairment Problem Solving: Impaired Problem Solving Impairment: Verbal complex Executive Function: Reasoning, Organizing Reasoning: Impaired Reasoning Impairment: Verbal complex Organizing: Impaired Organizing Impairment: Verbal complex (Clock drawing: 0/4)    Extremity Assessment (includes Sensation/Coordination)   Upper Extremity Assessment: Generalized weakness, RUE deficits/detail RUE Deficits / Details: suspectful of inattention of R side. Weakness 4/5 in R shoulder and grip strength compared to 5/5 in L UE. Pt able to open toothpaste and brush teeth with R hand.   RUE Sensation: WNL  Lower Extremity Assessment: Defer to PT evaluation RLE Deficits / Details: mild inattention, decreased strength LLE Deficits / Details: WFL     ADLs   Overall ADL's : Needs assistance/impaired Grooming: Oral care, Standing, Min guard Grooming Details (indicate cue type and reason): Min guard for safety due to generalized weakness Upper Body Bathing: Sitting, Supervision/ safety Upper Body Bathing Details (indicate cue type and reason): supervision for safety Lower Body Bathing: Minimal assistance, Sitting/lateral leans Lower Body Bathing Details (indicate cue type and reason): Min A for LOB and lean to L with reaching for feet Upper Body Dressing  : Supervision/safety, Sitting Upper Body Dressing Details (indicate cue type and reason): Supervision for safety Lower Body Dressing: Minimal assistance, Sitting/lateral leans Lower Body Dressing Details (indicate cue type and reason): Min A for LOB and lean to L with donning socks Toilet Transfer: Min guard, Cueing for safety, Ambulation Toilet Transfer Details (indicate cue type and reason): Min guard for safety simulated to reclienr Toileting- Architect and Hygiene: Min guard, Sit to/from stand Toileting - Clothing Manipulation Details (indicate cue type and reason): Min guard for safety Functional mobility during ADLs: Minimal assistance, Supervision/safety, Cueing for safety General ADL Comments: Min A - Supervision for safety with ADLs due to lack of cognition, decreased coordination/strength with R UE, and deficits in balance     Mobility   Overal bed mobility: Modified Independent Bed Mobility: Supine to Sit Supine to sit: Supervision Sit to supine: Supervision General bed mobility comments: use of rail and increased time/effort      Transfers   Overall transfer level: Needs assistance Equipment used: None Transfers: Sit to/from Stand Sit to Stand: Min guard, Min assist General transfer comment: min guard to stand and min A in standing due to posterior bias; no overt LOB     Ambulation / Gait / Stairs / Psychologist, prison and probation services   Ambulation/Gait Ambulation/Gait assistance: Editor, commissioning (Feet): 200 Feet Assistive device:  (assist at trunk with gait belt) Gait Pattern/deviations: Step-through pattern, Decreased stride length, Narrow base of support, Drifts right/left General Gait Details: assistance required for balance; LOB with turning to R side and with horizontal head turns; pt runs into objects on R side and requires cues throughout to attend to R side  Gait velocity: decreased Stairs: Yes Stairs assistance: Min assist Stair Management: No rails,  Alternating pattern, One rail Left, Step to pattern, Forwards Number of Stairs: 2 General stair comments: pt initially felt she ascend steps without rail however unsteady and hit R toes on step; rail used for increased stability and  cues for step to pattern      Posture / Balance Balance Overall balance assessment: Needs assistance Sitting-balance support: No upper extremity supported, Feet supported Sitting balance-Leahy Scale: Good Standing balance support: During functional activity, No upper extremity supported Standing balance-Leahy Scale: Fair Standing balance comment: able to static stand without UE support  Standardized Balance Assessment Standardized Balance Assessment : Dynamic Gait Index Dynamic Gait Index Level Surface: Mild Impairment Change in Gait Speed: Mild Impairment Gait with Horizontal Head Turns: Moderate Impairment Gait with Vertical Head Turns: Mild Impairment Gait and Pivot Turn: Mild Impairment Step Over Obstacle: Mild Impairment Step Around Obstacles: Moderate Impairment Steps: Mild Impairment Total Score: 14     Special needs/care consideration Designated visitor is Armond an second visitor to be determined by patient    Previous Home Environment  Living Arrangements: Spouse/significant other  Lives With: Spouse Available Help at Discharge: Family, Available 24 hours/day Type of Home: House Home Layout: One level Home Access: Level entry Bathroom Shower/Tub: Engineer, manufacturing systems: Standard Bathroom Accessibility: Yes How Accessible: Accessible via walker Home Care Services: No   Discharge Living Setting Plans for Discharge Living Setting: Lives with (comment) (spouse) Type of Home at Discharge: House Discharge Home Layout: One level Discharge Home Access: Level entry Discharge Bathroom Shower/Tub: Tub/shower unit Discharge Bathroom Toilet: Standard Discharge Bathroom Accessibility: Yes How Accessible: Accessible via walker Does the  patient have any problems obtaining your medications?: No   Social/Family/Support Systems Patient Roles: Spouse Contact Information: [phone] Anticipated Caregiver: Remus Blake Anticipated Caregiver's Contact Information: see above Ability/Limitations of Caregiver: none  Caregiver Availability: 24/7 Discharge Plan Discussed with Primary Caregiver: Yes Is Caregiver In Agreement with Plan?: Yes Does Caregiver/Family have Issues with Lodging/Transportation while Pt is in Rehab?: No   Goals Patient/Family Goal for Rehab: PT/OT/SLP Supervision/Mod I Expected length of stay: 4-7 days Pt/Family Agrees to Admission and willing to participate: Yes Program Orientation Provided & Reviewed with Pt/Caregiver Including Roles  & Responsibilities: Yes   Decrease burden of Care through IP rehab admission: n/a   Possible need for SNF placement upon discharge:not anticipated   Patient Condition: This patient's condition remains as documented in the consult dated 04/05/2020, in which the Rehabilitation Physician determined and documented that the patient's condition is appropriate for intensive rehabilitative care in an inpatient rehabilitation facility. Will admit to inpatient rehab today.   Preadmission Screen Completed By:  Clois Dupes, RN, 04/07/2020 12:29 PM ______________________________________________________________________   Discussed status with Dr. Allena Katz on 04/07/2020 at  1245 and received approval for admission today.   Admission Coordinator:  Clois Dupes, time 4034 Date 04/07/2020         Revision History                          Note Details  Author Allena Katz, Maryln Gottron, MD File Time 04/07/2020 12:47 PM  Author Type Physician Status Addendum  Last Editor Jessica Fennel, MD Service Physical Medicine and Rehabilitation  [facility] Acct # [id number] Admit Date 04/07/2020

## 2020-04-07 NOTE — Progress Notes (Signed)
Occupational Therapy Treatment Patient Details Name: Kaziah Krizek MRN: [id number] DOB: Oct 23, 1960 Today's Date: 04/07/2020    History of present illness Pt is a 59 y/o female presenting to the ED on 04/03/20 with primary complaints of R sided weakness. 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 Patient admitted for further work-up. PMH of HLD , cerebral aneurysms status post coiling.   OT comments  Upon arrival pt sitting upright in bed - excited and agreeable to skilled OT services. Pt with better affect with appropriate facial expressions and reactions to jokes. Slow processing still noted, but much improved since last session. Pt completed Short Blessed Test scoring 3 points, with interruptions, indicating normal cognition and 2 letter cancellation worksheets with 100% accuracy. Noted decreased peripheral vision in R and L fields and still presenting with R inattention with table scanning activity needing multimodal cues to look to the right. Min-Mod A with transfers and ambulation with one noted LOB when ambulating bed<>chair. Pt reports feeling better and being excited to go to CIR. Pt was left in recliner with chair alarm and phone/call bell within reach.     Follow Up Recommendations  CIR    Equipment Recommendations  Other (comment) (TBD at next venue of care)    Recommendations for Other Services Rehab consult    Precautions / Restrictions Precautions Precautions: Fall Restrictions Weight Bearing Restrictions: No       Mobility Bed Mobility Overal bed mobility: Needs Assistance Bed Mobility: Supine to Sit       Sit to supine: HOB elevated (close supervision)   General bed mobility comments: close supervision due to increased effort by pt  Transfers Overall transfer level: Needs assistance Equipment used: 1 person hand held assist;None Transfers: Sit to/from Stand Sit to Stand: Min assist;Mod assist          General transfer comment: Mod A to power up and Min A with one noted LOB from bed<>recliner    Balance Overall balance assessment: Needs assistance Sitting-balance support: Feet unsupported;No upper extremity supported Sitting balance-Leahy Scale: Fair     Standing balance support: During functional activity Standing balance-Leahy Scale: Fair Standing balance comment: needs 1 UE assist for support for boost but can statically stand without UE support                           ADL either performed or assessed with clinical judgement   ADL                           Toilet Transfer: Moderate assistance;Minimal assistance;Ambulation (1 UE HHA) Toilet Transfer Details (indicate cue type and reason): simulated to recliner. Mod A to power up and Min A with one noted LOB from bed<>recliner                 Vision   Vision Assessment?: Yes Eye Alignment: Within Functional Limits Ocular Range of Motion: Within Functional Limits Alignment/Gaze Preference: Within Defined Limits Visual Fields: Right visual field deficit Additional Comments: Pt completed letter cancellation activity with 100% accuracy and did well reading outloud and scanning. Noted decreased peripheral vision on the R and L side. Pt still presenting with R inattention with table scanning activity needing multimodal cues to look to the right          Cognition Arousal/Alertness: Awake/alert Behavior During Therapy: Flat affect Overall Cognitive Status: History of  cognitive impairments - at baseline Area of Impairment: Memory;Awareness;Problem solving;Following commands                   Current Attention Level: Sustained Memory: Decreased short-term memory Following Commands: Follows one step commands with increased time Safety/Judgement: Decreased awareness of deficits Awareness: Emergent Problem Solving: Slow processing;Requires verbal cues General Comments: flat affect but  smiling and laughing appropriately. Slow processing but improving. Pt remains having decreased awareness of deficits. Pt scored 3 on Short Blessed Test, with distractions mid test, indicating normal cognition. Decreased STM of remembering address and where we left off on test              General Comments Pt reports that she is feeling better and is ready to go to CIR.    Pertinent Vitals/ Pain       Pain Assessment: No/denies pain      Frequency  Min 2X/week        Progress Toward Goals  OT Goals(current goals can now be found in the care plan section)  Progress towards OT goals: Progressing toward goals  Acute Rehab OT Goals Patient Stated Goal: none stated Time For Goal Achievement: 04/19/20 Potential to Achieve Goals: Good  Plan Discharge plan remains appropriate       AM-PAC OT "6 Clicks" Daily Activity     Outcome Measure   Help from another person eating meals?: A Little Help from another person taking care of personal grooming?: A Little Help from another person toileting, which includes using toliet, bedpan, or urinal?: A Little Help from another person bathing (including washing, rinsing, drying)?: A Little Help from another person to put on and taking off regular upper body clothing?: A Little Help from another person to put on and taking off regular lower body clothing?: A Little 6 Click Score: 18    End of Session Equipment Utilized During Treatment: Gait belt  OT Visit Diagnosis: Unsteadiness on feet (R26.81);Other abnormalities of gait and mobility (R26.89);Muscle weakness (generalized) (M62.81);Low vision, both eyes (H54.2);Other symptoms and signs involving the nervous system (R29.898);Other symptoms and signs involving cognitive function   Activity Tolerance Patient tolerated treatment well   Patient Left in chair;with call bell/phone within reach;with chair alarm set   Nurse Communication Mobility status      Time: 1110-1135 OT Time  Calculation (min): 25 min  Charges: OT General Charges $OT Visit: 1 Visit OT Treatments $Therapeutic Activity: 8-22 mins $Cognitive Funtion inital: Initial 15 mins  Dmarcus Decicco/OTS   Taylin Mans 04/07/2020, 1:07 PM

## 2020-04-07 NOTE — Discharge Summary (Signed)
Physician Discharge Summary  Jessica SoleJanet [name] ZOX:[id number] DOB: June 12, 1961 DOA: 04/03/2020  PCP: Patient, No Pcp Per  Admit date: 04/03/2020 Discharge date: 04/07/2020  Admitted From: Home Disposition:  CIR  Recommendations for Outpatient Follow-up:  1. Follow up with PCP in 2-3 weeks 2. Follow up with Neruology as scheduled  Discharge Condition:Stable CODE STATUS:Full Diet recommendation: Heart healthy   Brief/Interim Summary: 59 year old femalehistory of hyperlipidemia, chronic heavy smoking, history of cerebral aneurysms status post coiling. On April 04, 2019 when she presented with right-sided weakness and CT/MRI evidence of multiple areas of acute and subacute infarct in the brain suggestive of emboli and occlusion of the left A2 segment which was considered the most recent symptomatic event. She was loaded on Plavix 300mg  on April 03, 2020 and since then has been on 75 mg once daily. Also started on aspirin 81 mg once daily on the day of admission. Overall she has improved although the significant slowness in speech and mild right-sided deficit.  During the stay she underwent CT scan of the chest that shows left upper lobe 3.3 cm spiculated mass with peripheral cavitation and right middle lobe cavitating pulmonary 2 cm mass in the lateral segment with cavitation and an indeterminate 10 mm nodule in the left adrenal gland. There was also pathologic enlargement of mediastinal adenopathy in the precarinal and the subcarinal and hilar areas [noted noncontrasted CT] and therefore pulmonary has been consulted.CT scan also showed background evidence of emphysema.   Patient husband reports history of cough for the last 8 months early in the morning when she smokes a cigarette. Endorses that she been smoking cigarettes since age 59. Otherwise no autoimmune disease or vasculitis. No hemoptysis no weight loss. No shortness of breath or wheezing no edema no proximal nocturnal  dyspnea  Discharge Diagnoses:  Active Problems:   Right sided weakness   TIA (transient ischemic attack)   Cerebrovascular accident (CVA) due to bilateral embolism of middle cerebral arteries (HCC)   Right hemiparesis (HCC)   Slow transit constipation   Tobacco abuse   Pulmonary nodules  Acute bilateral anterior and posterior embolic CVA -Multiple infarcts on MRI -Neurology following -Consideration for TEE and/or loop after below pulmonary nodules worked up -[facility] Plan for d/c to CIR today -Discussed with Neurology. Recommendation to continue on ASA 81mg  monotherapy at time of d/c  Abnormal CT scan of the chest showing left lung nodule, cavitating nodule -Concern for neoplastic process Hilar and mediastinal adenopathy -Pulmonary consulted. Plans for outpt f/u and bronch at that time. Pulmonary to arrange outpt follow up  Chronic obstructive pulmonary disease -Continue bronchodilator treatments -Stable at this time  Smoking cessation counseling this visit   Discharge Instructions  Discharge Instructions    Ambulatory referral to Neurology   Complete by: As directed    [facility], consider Dr. Delia HeadyPramod Sethi, Dr. Jamelle RushingVikram Penumali, or Dr. Naomie DeanAntonia Ahern - in about 4 weeks following rehab stay (likely 6-8 weeks from this order.   For d/c to IP rehab     Allergies as of 04/07/2020   No Known Allergies     Medication List    STOP taking these medications   oxyCODONE-acetaminophen 5-325 MG tablet Commonly known as: PERCOCET/ROXICET     TAKE these medications   albuterol (2.5 MG/3ML) 0.083% nebulizer solution Commonly known as: PROVENTIL Take 3 mLs (2.5 mg total) by nebulization every 6 (six) hours as needed for wheezing or shortness  of breath.   aspirin EC 81 MG tablet Take 81 mg by mouth daily. Swallow whole.   atorvastatin 80 MG tablet Commonly known as: LIPITOR Take 80 mg by mouth at  bedtime.   umeclidinium bromide 62.5 MCG/INH Aepb Commonly known as: INCRUSE ELLIPTA Inhale 1 puff into the lungs daily. Start taking on: April 08, 2020       Follow-up Information    [facility] Follow up in 4 week(s).   Specialty: Neurology Why: stroke clinic, following rehab stay. office will call with appt date and time.  Contact information: [address] [place] [address] [phone]       Follow up with PCP when discharged. Schedule an appointment as soon as possible for a visit.              No Known Allergies  Consultations:  Neurology Pulmonary  CIR  Procedures/Studies: DG Chest 2 View  Result Date: 04/03/2020 CLINICAL DATA:  TIA EXAM: CHEST - 2 VIEW COMPARISON:  Radiograph 01/20/2017 FINDINGS: Low lung volumes with streaky basilar areas of atelectasis. A more nodular masslike opacity is present in the right mid to lower lung. There is mild central vascular congestion and central airways thickening. No pneumothorax or visible effusion. The aorta is calcified. The remaining cardiomediastinal contours are unremarkable. No acute osseous or soft tissue abnormality. IMPRESSION: 1. Low lung volumes with streaky basilar atelectasis. 2. Additional central vascular congestion, airways thickening, and few septal lines could reflect developing interstitial edema. 3. Nodular opacity in the right mid to lower lung, recommend further evaluation with CT imaging. Electronically Signed   By: Kreg Shropshire M.D.   On: 04/03/2020 21:36   CT Head Wo Contrast  Result Date: 04/03/2020 CLINICAL DATA:  Right-sided weakness/numbness which has resolved. Previous history of coil embolization of intracerebral aneurysms. EXAM: CT HEAD WITHOUT CONTRAST TECHNIQUE: Contiguous axial images were obtained from the base of the skull through the vertex without intravenous contrast. COMPARISON:  01/20/2017 FINDINGS: Brain: Ventricles, cisterns and other CSF  spaces are within normal. There is evidence of chronic ischemic microvascular disease. There is a small old left occipital infarct. Possible old lacunar infarct over the left cerebellum. Moderate streak artifact from patient's previous aneurysm coiling involving bilateral posterior communicating arteries as well as region of the anterior communicating artery. No evidence of mass, mass effect or acute hemorrhage. No evidence of acute infarction. Vascular: Embolization coils as described. Skull: Normal. Negative for fracture or focal lesion. Sinuses/Orbits: No acute finding. Other: None. IMPRESSION: 1.  No acute findings. 2. 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 3. Chronic ischemic microvascular disease. Possible old left cerebellar lacunar infarct as well as small old left occipital infarct. Electronically Signed   By: Elberta Fortis M.D.   On: 04/03/2020 10:54   CT CHEST WO CONTRAST  Result Date: 04/04/2020 CLINICAL DATA:  Shortness of breath, right lung nodularity on chest x-ray EXAM: CT CHEST WITHOUT CONTRAST TECHNIQUE: Multidetector CT imaging of the chest was performed following the standard protocol without IV contrast. COMPARISON:  04/03/2020 FINDINGS: Cardiovascular: Unenhanced imaging of the heart and great vessels demonstrates no pericardial effusion. Mild atherosclerosis of the thoracic aorta. Mediastinum/Nodes: Pathologic mediastinal adenopathy is identified. Largest lymph node in the AP window measures 13 mm in short axis, reference image 52/3. Precarinal adenopathy measures up to 14 mm in short axis, reference image 56/3. Subcarinal and hilar adenopathy are also identified, more difficult to measure given lack of IV contrast.  The thyroid, trachea, and esophagus are grossly unremarkable. Lungs/Pleura: Spiculated left upper lobe mass is identified with peripheral cavitation, measuring up to 3.3 x 2.2 cm reference image 48/4. There is a second  cavitating nodule within the lateral segment right middle lobe measuring 2.0 x 1.9 cm reference image 98/4. This corresponds to chest x-ray finding. There is background emphysema. No effusion or pneumothorax. Dependent hypoventilatory changes are noted. Upper Abdomen: Indeterminate 10 mm nodule within the left adrenal gland is noted. The remainder of the upper abdomen is unremarkable. Musculoskeletal: No acute or destructive bony lesions. Reconstructed images demonstrate no additional findings. IMPRESSION: 1. 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 The left upper lobe nodule is spiculated. Malignancy is the diagnosis of exclusion. Far less likely cavitating infection or septic emboli could be considered in the appropriate setting. 2. Pathologic mediastinal and hilar adenopathy worrisome for nodal metastases. 3. Nonspecific left adrenal nodule, metastatic disease not excluded. 4. Aortic Atherosclerosis (ICD10-I70.0) and Emphysema (ICD10-J43.9). These results will be called to the ordering clinician or representative by the Radiologist Assistant, and communication documented in the PACS or [employer]. Electronically Signed   By: Sharlet Salina M.D.   On: 04/04/2020 01:41   MR ANGIO HEAD WO CONTRAST  Result Date: 04/03/2020 CLINICAL DATA:  TIA. Right-sided numbness and weakness. History of aneurysm coiling. EXAM: MRI HEAD WITHOUT AND WITH CONTRAST MRA HEAD WITHOUT   CONTRAST MRA NECK WITHOUT   CONTRAST TECHNIQUE: Multiplanar, multiecho pulse sequences of the brain and surrounding structures were obtained without and with intravenous contrast. Angiographic images of the head were obtained using MRA technique without contrast. Angiographic sequences of the neck and surrounding structures were obtained without intravenous contrast. CONTRAST:  7.71mL GADAVIST GADOBUTROL 1 MMOL/ML IV SOLN COMPARISON:  None. FINDINGS: MRI HEAD FINDINGS Brain: Multiple areas of recent infarction  bilaterally. Acute/subacute infarct in the left frontal lobe over the convexity involving cortex and white matter. Cortical enhancement over the left frontal convexity without restricted diffusion suggesting late subacute infarct. Small area of acute or subacute infarct right medial parietal lobe. Subacute infarct with mild enhancement in the left parietal lobe. Cluster of small acute infarcts in the right posterior cerebellum with mild enhancement. Moderate chronic microvascular ischemic change in the white matter and pons. Mild atrophy without hydrocephalus. No intracranial hemorrhage or mass. Vascular: Normal arterial flow voids. Skull and upper cervical spine: No focal skeletal lesion. Sinuses/Orbits: Paranasal sinuses clear.  Normal orbit Other: None MRA HEAD FINDINGS Both vertebral arteries patent to the basilar. Left PICA patent. Right PICA not visualized but there is a right AICA which may contribute to this territory. Basilar widely patent. Fetal origin right posterior cerebral artery. Mild stenosis right P2 segment. Left posterior cerebral artery widely patent. Internal carotid artery patent bilaterally without stenosis. Hypoplastic right A1 segment which is small. Left A1 widely patent. Occlusion left A2 segment. Mild stenosis right A2 segment. Middle cerebral arteries patent bilaterally without significant stenosis. MRA NECK FINDINGS Antegrade flow in carotid and vertebral arteries bilaterally. Carotid bifurcation patent bilaterally without stenosis. Both vertebral arteries are patent without stenosis. IMPRESSION: 1. Multiple areas of acute and subacute infarct in the brain bilaterally suggestive of emboli. 2. Moderate chronic microvascular ischemic change in the white matter and pons. 3. Occlusion left A2 segment and mild stenosis right A2 segment. Hypoplastic right A1 segment. 4. Mild stenosis right P2 segment. Middle cerebral arteries patent bilaterally. Electronically Signed   By: Marlan Palau M.D.    On: 04/03/2020 14:38  MR ANGIO NECK WO CONTRAST  Result Date: 04/03/2020 CLINICAL DATA:  TIA. Right-sided numbness and weakness. History of aneurysm coiling. EXAM: MRI HEAD WITHOUT AND WITH CONTRAST MRA HEAD WITHOUT   CONTRAST MRA NECK WITHOUT   CONTRAST TECHNIQUE: Multiplanar, multiecho pulse sequences of the brain and surrounding structures were obtained without and with intravenous contrast. Angiographic images of the head were obtained using MRA technique without contrast. Angiographic sequences of the neck and surrounding structures were obtained without intravenous contrast. CONTRAST:  7.54mL GADAVIST GADOBUTROL 1 MMOL/ML IV SOLN COMPARISON:  None. FINDINGS: MRI HEAD FINDINGS Brain: Multiple areas of recent infarction bilaterally. Acute/subacute infarct in the left frontal lobe over the convexity involving cortex and white matter. Cortical enhancement over the left frontal convexity without restricted diffusion suggesting late subacute infarct. Small area of acute or subacute infarct right medial parietal lobe. Subacute infarct with mild enhancement in the left parietal lobe. Cluster of small acute infarcts in the right posterior cerebellum with mild enhancement. Moderate chronic microvascular ischemic change in the white matter and pons. Mild atrophy without hydrocephalus. No intracranial hemorrhage or mass. Vascular: Normal arterial flow voids. Skull and upper cervical spine: No focal skeletal lesion. Sinuses/Orbits: Paranasal sinuses clear.  Normal orbit Other: None MRA HEAD FINDINGS Both vertebral arteries patent to the basilar. Left PICA patent. Right PICA not visualized but there is a right AICA which may contribute to this territory. Basilar widely patent. Fetal origin right posterior cerebral artery. Mild stenosis right P2 segment. Left posterior cerebral artery widely patent. Internal carotid artery patent bilaterally without stenosis. Hypoplastic right A1 segment which is small. Left A1 widely  patent. Occlusion left A2 segment. Mild stenosis right A2 segment. Middle cerebral arteries patent bilaterally without significant stenosis. MRA NECK FINDINGS Antegrade flow in carotid and vertebral arteries bilaterally. Carotid bifurcation patent bilaterally without stenosis. Both vertebral arteries are patent without stenosis. IMPRESSION: 1. Multiple areas of acute and subacute infarct in the brain bilaterally suggestive of emboli. 2. Moderate chronic microvascular ischemic change in the white matter and pons. 3. Occlusion left A2 segment and mild stenosis right A2 segment. Hypoplastic right A1 segment. 4. Mild stenosis right P2 segment. Middle cerebral arteries patent bilaterally. Electronically Signed   By: Marlan Palau M.D.   On: 04/03/2020 14:38   MR Brain W and Wo Contrast  Result Date: 04/03/2020 CLINICAL DATA:  TIA. Right-sided numbness and weakness. History of aneurysm coiling. EXAM: MRI HEAD WITHOUT AND WITH CONTRAST MRA HEAD WITHOUT   CONTRAST MRA NECK WITHOUT   CONTRAST TECHNIQUE: Multiplanar, multiecho pulse sequences of the brain and surrounding structures were obtained without and with intravenous contrast. Angiographic images of the head were obtained using MRA technique without contrast. Angiographic sequences of the neck and surrounding structures were obtained without intravenous contrast. CONTRAST:  7.41mL GADAVIST GADOBUTROL 1 MMOL/ML IV SOLN COMPARISON:  None. FINDINGS: MRI HEAD FINDINGS Brain: Multiple areas of recent infarction bilaterally. Acute/subacute infarct in the left frontal lobe over the convexity involving cortex and white matter. Cortical enhancement over the left frontal convexity without restricted diffusion suggesting late subacute infarct. Small area of acute or subacute infarct right medial parietal lobe. Subacute infarct with mild enhancement in the left parietal lobe. Cluster of small acute infarcts in the right posterior cerebellum with mild enhancement. Moderate  chronic microvascular ischemic change in the white matter and pons. Mild atrophy without hydrocephalus. No intracranial hemorrhage or mass. Vascular: Normal arterial flow voids. Skull and upper cervical spine: No focal skeletal lesion. Sinuses/Orbits: Paranasal sinuses clear.  Normal orbit Other: None MRA HEAD FINDINGS Both vertebral arteries patent to the basilar. Left PICA patent. Right PICA not visualized but there is a right AICA which may contribute to this territory. Basilar widely patent. Fetal origin right posterior cerebral artery. Mild stenosis right P2 segment. Left posterior cerebral artery widely patent. Internal carotid artery patent bilaterally without stenosis. Hypoplastic right A1 segment which is small. Left A1 widely patent. Occlusion left A2 segment. Mild stenosis right A2 segment. Middle cerebral arteries patent bilaterally without significant stenosis. MRA NECK FINDINGS Antegrade flow in carotid and vertebral arteries bilaterally. Carotid bifurcation patent bilaterally without stenosis. Both vertebral arteries are patent without stenosis. IMPRESSION: 1. Multiple areas of acute and subacute infarct in the brain bilaterally suggestive of emboli. 2. Moderate chronic microvascular ischemic change in the white matter and pons. 3. Occlusion left A2 segment and mild stenosis right A2 segment. Hypoplastic right A1 segment. 4. Mild stenosis right P2 segment. Middle cerebral arteries patent bilaterally. Electronically Signed   By: Marlan Palau M.D.   On: 04/03/2020 14:38   US RENAL  Result Date: 04/04/2020 CLINICAL DATA:  Hematuria EXAM: RENAL / URINARY TRACT ULTRASOUND COMPLETE COMPARISON:  CT dated March 15, 2015 FINDINGS: Right Kidney: Renal measurements: 11.2 x 5.1 x 4.4 cm = volume: 130 mL . Echogenicity within normal limits. No mass or hydronephrosis visualized. A small 1.4 cm cyst is noted. Left Kidney: Renal measurements: 10.2 x 5.1 x 4.7 cm = volume: 126 mL. Echogenicity within normal  limits. No mass or hydronephrosis visualized. Bladder: Appears normal for degree of bladder distention. Other: None. IMPRESSION: Unremarkable study. Electronically Signed   By: Katherine Mantle M.D.   On: 04/04/2020 21:46   ECHOCARDIOGRAM COMPLETE  Result Date: 04/04/2020    ECHOCARDIOGRAM REPORT   Patient Name:   Jessica [name] Date of Exam: 04/04/2020 Medical Rec #:  [id number]    Height:       65.0 in Accession #:    [id number]   Weight:       162.8 lb Date of Birth:  [birth year]    BSA:          1.812 m Patient Age:    59 years     BP:           162/87 mmHg Patient Gender: F            HR:           74 bpm. Exam Location:  Inpatient Procedure: 2D Echo, Color Doppler, Cardiac Doppler and Intracardiac            Opacification Agent Indications:    TIA  History:        Patient has no prior history of Echocardiogram examinations.  Sonographer:    Roosvelt Maser RDCS Referring Phys: [id number] SARA-MAIZ A THOMAS IMPRESSIONS  1. Left ventricular ejection fraction, by estimation, is 60 to 65%. The left ventricle has normal function. The left ventricle has no regional wall motion abnormalities. Left ventricular diastolic parameters are consistent with Grade I diastolic dysfunction (impaired relaxation).  2. Right ventricular systolic function is normal. The right ventricular size is normal.  3. The mitral valve is normal in structure. No evidence of mitral valve regurgitation. No evidence of mitral stenosis.  4. The aortic valve is normal in structure. Aortic valve regurgitation is mild. No aortic stenosis is present.  5. The inferior vena cava is normal in size with greater than 50% respiratory variability, suggesting right atrial pressure of 3 mmHg. FINDINGS  Left Ventricle: Left ventricular ejection fraction, by estimation, is 60 to 65%. The left ventricle has normal function. The left ventricle has no regional wall motion abnormalities. Definity contrast agent was given IV to delineate the left ventricular  endocardial  borders. The left ventricular internal cavity size was normal in size. There is no left ventricular hypertrophy. Left ventricular diastolic parameters are consistent with Grade I diastolic dysfunction (impaired relaxation). Normal left ventricular filling pressure. Right Ventricle: The right ventricular size is normal. No increase in right ventricular wall thickness. Right ventricular systolic function is normal. Left Atrium: Left atrial size was normal in size. Right Atrium: Right atrial size was normal in size. Pericardium: There is no evidence of pericardial effusion. Mitral Valve: The mitral valve is normal in structure. Normal mobility of the mitral valve leaflets. No evidence of mitral valve regurgitation. No evidence of mitral valve stenosis. Tricuspid Valve: The tricuspid valve is normal in structure. Tricuspid valve regurgitation is not demonstrated. No evidence of tricuspid stenosis. Aortic Valve: The aortic valve is normal in structure. Aortic valve regurgitation is mild. No aortic stenosis is present. Pulmonic Valve: The pulmonic valve was normal in structure. Pulmonic valve regurgitation is not visualized. No evidence of pulmonic stenosis. Aorta: The aortic root is normal in size and structure. Venous: The inferior vena cava is normal in size with greater than 50% respiratory variability, suggesting right atrial pressure of 3 mmHg. IAS/Shunts: No atrial level shunt detected by color flow Doppler.  LEFT VENTRICLE PLAX 2D LVIDd:         4.30 cm  Diastology LVIDs:         2.80 cm  LV e' lateral:   6.31 cm/s LV PW:         0.80 cm  LV E/e' lateral: 8.7 LV IVS:        0.80 cm  LV e' medial:    5.00 cm/s LVOT diam:     1.90 cm  LV E/e' medial:  11.0 LVOT Area:     2.84 cm  RIGHT VENTRICLE          IVC RV Basal diam:  2.80 cm  IVC diam: 1.20 cm LEFT ATRIUM           Index       RIGHT ATRIUM           Index LA diam:      2.20 cm 1.21 cm/m  RA Area:     16.70 cm LA Vol (A4C): 68.6 ml 37.85 ml/m RA Volume:    45.60 ml  25.16 ml/m   AORTA Ao Root diam: 2.80 cm Ao Asc diam:  2.90 cm MITRAL VALVE MV Area (PHT): 4.31 cm    SHUNTS MV Decel Time: 176 msec    Systemic Diam: 1.90 cm MV E velocity: 54.80 cm/s MV A velocity: 79.70 cm/s MV E/A ratio:  0.69 Mihai Croitoru MD Electronically signed by Thurmon Fair MD Signature Date/Time: 04/04/2020/5:21:05 PM    Final    VAS [place] LOWER EXTREMITY VENOUS (DVT)  Result Date: 04/04/2020  Lower Venous DVTStudy Indications: Stroke.  Limitations: Positioning. Comparison Study: No prior study on file Performing Technologist: Sherren Kerns RVS  Examination Guidelines: A complete evaluation includes B-mode imaging, spectral Doppler, color Doppler, and power Doppler as needed of all accessible portions of each vessel. Bilateral testing is considered an integral part of a complete examination. Limited examinations for reoccurring indications may be performed as noted. The reflux portion of the exam is performed with the  patient in reverse Trendelenburg.  +---------+---------------+---------+-----------+----------+--------------+ RIGHT    CompressibilityPhasicitySpontaneityPropertiesThrombus Aging +---------+---------------+---------+-----------+----------+--------------+ CFV      Full           Yes      Yes                                 +---------+---------------+---------+-----------+----------+--------------+ SFJ      Full                                                        +---------+---------------+---------+-----------+----------+--------------+ FV Prox  Full                                                        +---------+---------------+---------+-----------+----------+--------------+ FV Mid   Full                                                        +---------+---------------+---------+-----------+----------+--------------+ FV DistalFull                                                         +---------+---------------+---------+-----------+----------+--------------+ PFV      Full                                                        +---------+---------------+---------+-----------+----------+--------------+ POP      Full           Yes      Yes                                 +---------+---------------+---------+-----------+----------+--------------+ PTV      Full                                                        +---------+---------------+---------+-----------+----------+--------------+ PERO     Full                                                        +---------+---------------+---------+-----------+----------+--------------+   +---------+---------------+---------+-----------+----------+--------------+ LEFT     CompressibilityPhasicitySpontaneityPropertiesThrombus Aging +---------+---------------+---------+-----------+----------+--------------+ CFV      Full           Yes      Yes                                 +---------+---------------+---------+-----------+----------+--------------+  SFJ      Full                                                        +---------+---------------+---------+-----------+----------+--------------+ FV Prox  Full                                                        +---------+---------------+---------+-----------+----------+--------------+ FV Mid   Full                                                        +---------+---------------+---------+-----------+----------+--------------+ FV DistalFull                                                        +---------+---------------+---------+-----------+----------+--------------+ PFV      Full                                                        +---------+---------------+---------+-----------+----------+--------------+ POP      Full           Yes      Yes                                  +---------+---------------+---------+-----------+----------+--------------+ PTV      Full                                                        +---------+---------------+---------+-----------+----------+--------------+ PERO     Full                                                        +---------+---------------+---------+-----------+----------+--------------+     Summary: BILATERAL: - No evidence of deep vein thrombosis seen in the lower extremities, bilaterally. -   *See table(s) above for measurements and observations. Electronically signed by Waverly Ferrari MD on 04/04/2020 at 6:48:44 PM.    Final     Subjective: Eager to start therapy  Discharge Exam: Vitals:   04/07/20 0855 04/07/20 1105  BP: 129/90 (!) 155/77  Pulse: 63 70  Resp: 18 (!) 22  Temp: 98.3 F (36.8 C)   SpO2: 92% 100%   Vitals:   04/06/20 2355 04/07/20 0403 04/07/20 0855 04/07/20 1105  BP: 118/68 128/81 129/90 (!) 155/77  Pulse: 66 67  63 70  Resp: 16 15 18  (!) 22  Temp: 97.9 F (36.6 C) 97.7 F (36.5 C) 98.3 F (36.8 C)   TempSrc: Oral Oral Oral   SpO2: 92% 92% 92% 100%  Weight:      Height:        General: Pt is alert, awake, not in acute distress Cardiovascular: RRR, S1/S2 +, no rubs, no gallops Respiratory: CTA bilaterally, no wheezing, no rhonchi Abdominal: Soft, NT, ND, bowel sounds + Extremities: no edema, no cyanosis   The results of significant diagnostics from this hospitalization (including imaging, microbiology, ancillary and laboratory) are listed below for reference.     Microbiology: Recent Results (from the past 240 hour(s))  SARS Coronavirus 2 by RT PCR (hospital order, performed in [facility] hospital lab) Nasopharyngeal Nasopharyngeal Swab     Status: None   Collection Time: 04/03/20  2:01 PM   Specimen: Nasopharyngeal Swab  Result Value Ref Range Status   SARS Coronavirus 2 NEGATIVE NEGATIVE Final    Comment: (NOTE) SARS-CoV-2 target nucleic acids are NOT  DETECTED.  The SARS-CoV-2 RNA is generally detectable in upper and lower respiratory specimens during the acute phase of infection. The lowest concentration of SARS-CoV-2 viral copies this assay can detect is 250 copies / mL. A negative result does not preclude SARS-CoV-2 infection and should not be used as the [name] basis for treatment or other patient management decisions.  A negative result may occur with improper specimen collection / handling, submission of specimen other than nasopharyngeal swab, presence of viral mutation(s) within the areas targeted by this assay, and inadequate number of viral copies (<250 copies / mL). A negative result must be combined with clinical observations, patient history, and epidemiological information.  Fact Sheet for Patients:   BoilerBrush.com.cy  Fact Sheet for Healthcare Providers: https://pope.com/  This test is not yet approved or  cleared by the [place] FDA and has been authorized for detection and/or diagnosis of SARS-CoV-2 by FDA under an Emergency Use Authorization (EUA).  This EUA will remain in effect (meaning this test can be used) for the duration of the COVID-19 declaration under Section 564(b)(1) of the Act, 21 U.S.C. section 360bbb-3(b)(1), unless the authorization is terminated or revoked sooner.  Performed at [facility], [address] [place], [place] [address]      Labs: BNP (last 3 results) Recent Labs    04/04/20 0321  BNP 144.9*   Basic Metabolic Panel: Recent Labs  Lab 04/03/20 1004  NA 142  K 3.8  CL 107  CO2 24  GLUCOSE 101*  BUN 10  CREATININE 0.48  CALCIUM 8.9   Liver Function Tests: Recent Labs  Lab 04/03/20 1004  AST 18  ALT 22  ALKPHOS 108  BILITOT 0.7  PROT 7.1  ALBUMIN 4.0   No results for input(s): LIPASE, AMYLASE in the last 168 hours. No results for input(s): AMMONIA in the last 168 hours. CBC: Recent Labs  Lab  04/03/20 1004  WBC 8.1  NEUTROABS 4.8  HGB 13.3  HCT 41.3  MCV 91.0  PLT 188   Cardiac Enzymes: No results for input(s): CKTOTAL, CKMB, CKMBINDEX, TROPONINI in the last 168 hours. BNP: Invalid input(s): POCBNP CBG: Recent Labs  Lab 04/03/20 0943  GLUCAP 93   D-Dimer No results for input(s): DDIMER in the last 72 hours. Hgb A1c No results for input(s): HGBA1C in the last 72 hours. Lipid Profile No results for input(s): CHOL, HDL, LDLCALC, TRIG, CHOLHDL, LDLDIRECT in  the last 72 hours. Thyroid function studies No results for input(s): TSH, T4TOTAL, T3FREE, THYROIDAB in the last 72 hours.  Invalid input(s): FREET3 Anemia work up No results for input(s): VITAMINB12, FOLATE, FERRITIN, TIBC, IRON, RETICCTPCT in the last 72 hours. Urinalysis    Component Value Date/Time   COLORURINE YELLOW 04/04/2020 0644   APPEARANCEUR CLEAR 04/04/2020 0644   LABSPEC 1.012 04/04/2020 0644   PHURINE 6.0 04/04/2020 0644   GLUCOSEU NEGATIVE 04/04/2020 0644   HGBUR LARGE (A) 04/04/2020 0644   BILIRUBINUR NEGATIVE 04/04/2020 0644   KETONESUR NEGATIVE 04/04/2020 0644   PROTEINUR NEGATIVE 04/04/2020 0644   NITRITE NEGATIVE 04/04/2020 0644   LEUKOCYTESUR NEGATIVE 04/04/2020 0644   Sepsis Labs Invalid input(s): PROCALCITONIN,  WBC,  LACTICIDVEN Microbiology Recent Results (from the past 240 hour(s))  SARS Coronavirus 2 by RT PCR (hospital order, performed in [facility] hospital lab) Nasopharyngeal Nasopharyngeal Swab     Status: None   Collection Time: 04/03/20  2:01 PM   Specimen: Nasopharyngeal Swab  Result Value Ref Range Status   SARS Coronavirus 2 NEGATIVE NEGATIVE Final    Comment: (NOTE) SARS-CoV-2 target nucleic acids are NOT DETECTED.  The SARS-CoV-2 RNA is generally detectable in upper and lower respiratory specimens during the acute phase of infection. The lowest concentration of SARS-CoV-2 viral copies this assay can detect is 250 copies / mL. A negative result does not  preclude SARS-CoV-2 infection and should not be used as the [name] basis for treatment or other patient management decisions.  A negative result may occur with improper specimen collection / handling, submission of specimen other than nasopharyngeal swab, presence of viral mutation(s) within the areas targeted by this assay, and inadequate number of viral copies (<250 copies / mL). A negative result must be combined with clinical observations, patient history, and epidemiological information.  Fact Sheet for Patients:   BoilerBrush.com.cy  Fact Sheet for Healthcare Providers: https://pope.com/  This test is not yet approved or  cleared by the [place] FDA and has been authorized for detection and/or diagnosis of SARS-CoV-2 by FDA under an Emergency Use Authorization (EUA).  This EUA will remain in effect (meaning this test can be used) for the duration of the COVID-19 declaration under Section 564(b)(1) of the Act, 21 U.S.C. section 360bbb-3(b)(1), unless the authorization is terminated or revoked sooner.  Performed at [facility], [address], [place], [place] [address]    Time spent: 30 min  SIGNED:   Rickey Barbara, MD  [facility] 04/07/2020, 12:33 PM  If 7PM-7AM, please contact night-coverage

## 2020-04-07 NOTE — Progress Notes (Signed)
Inpatient Rehabilitation Medication Review by a Pharmacist  A complete drug regimen review was completed for this patient to identify any potential clinically significant medication issues.  Clinically significant medication issues were identified:  no  Pharmacist comments:   Time spent performing this drug regimen review (minutes):  5  Danae Orleans, PharmD, BCPS 04/07/2020       4:09 PM

## 2020-04-07 NOTE — Progress Notes (Addendum)
Inpatient Rehabilitation Admissions Coordinator  I met with patient at bedside to explain that I have insurance approval for CIR admit once medical workup complete. I have discussed with Dr. Erlinda Hong and notified Dr Wyline Copas. I await clarification if medial work up complete and then can proceed with CIR admit.  Danne Baxter, RN, MSN Rehab Admissions Coordinator [phone] 04/07/2020 10:58 AM  Was notified by Dr. Wyline Copas that patient cleared to be admitted to [facility] today. I met with patient at bedside and spoke to her spouse by phone to answer question of CIR cost and goals of care. I will make the  arrangements to admit today.  Danne Baxter, RN, MSN Rehab Admissions Coordinator [phone] 04/07/2020 12:21 PM

## 2020-04-07 NOTE — PMR Pre-admission (Addendum)
PMR Admission Coordinator Pre-Admission Assessment  Patient: Jessica SoleJanet Boice is an 59 y.o., female MRN: [id number] DOB: 12-Mar-1961 Height: 5\' 5"  (165.1 cm) Weight: 73.8 kg              Insurance Information HMO:     PPO: yes     PCP:      IPA:      80/20:      OTHER:  PRIMARY: [employer]      Policy#: [id number]      Subscriber: pt CM Name: Baxter HireKristen  Phone#: [phone]     Fax#: [phone] Pre-Cert#: [id number] approved for 7 days      Employer: [employer] Benefits:  Phone #: [phone]     Name: 6/29 Eff. Date: 07/09/2014     Deduct: $750      Out of Pocket Max: $3500      Life Max: none  CIR: 80%      SNF: 80% 90 days per year Outpatient: $40 per visit     Co-Pay: visits per medical neccesity Home Health: 80%      Co-Pay: 100 days per year DME: 80%     Co-Pay: 205 Providers: in network  SECONDARY: none      Policy#:       Phone#:   Artistinancial Counselor:       Phone#:   The Data processing manager"Data Collection Information Summary" for patients in Inpatient Rehabilitation Facilities with attached "Privacy Act Statement-Health Care Records" was provided and verbally reviewed with: N/A  Emergency Contact Information Contact Information     Name Relation Home Work Mobile   Beavin,Armond Spouse [phone]        Current Medical History  Patient Admitting Diagnosis: CVA  History of Present Illness:  Jessica SoleJanet Neubecker is a 59 year old right-handed female history of hyperlipidemia, tobacco abuse and 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   Presented on 04/03/20 with right leg hemiparesis and numbness.  CT/MRI as well as MRA of the head neck showed multiple areas of acute and subacute bilateral infarcts suggestive of embolic source.  Moderate chronic microvascular ischemic change in the white matter and pons.  Occlusion left A2 segment and mild stenosis right A2 segment.  Patient did not receive TPA.  Echocardiogram with ejection fraction of 65% with grade 1 diastolic dysfunction and no regional  wall motion abnormalities.  Chest CT showed a left upper lobe and right middle lobe cavitating pulmonary nodules, pathologic mediastinal and hilar adenopathy worrisome for nodal metastasis.  Admission chemistries unremarkable except BNP 144 urinalysis negative nitrite.  Initially maintained on aspirin Plavix for CVA prophylaxis.  Plan for outpatient biopsy of pulmonary nodules.  Plavix currently remains on hold with anticipation of biopsy.  Patient is tolerating a regular diet.     Complete NIHSS TOTAL: 1    Past Medical History  Past Medical History:  Diagnosis Date   High cholesterol    Stroke [facility](HCC)     Family History  family history is not on file.  Prior Rehab/Hospitalizations:  Has the patient had prior rehab or hospitalizations prior to admission? Yes  Has the patient had major surgery during 100 days prior to admission? No  Current Medications   Current Facility-Administered Medications:    acetaminophen (TYLENOL) tablet 650 mg, 650 mg, Oral, Q4H PRN **OR** acetaminophen (TYLENOL) 160 MG/5ML solution 650 mg, 650 mg, Per Tube, Q4H PRN **OR** acetaminophen (TYLENOL) suppository 650 mg, 650 mg, Rectal, Q4H PRN, Lurline Delhomas, Sara-Maiz A, MD   albuterol (PROVENTIL) (2.5 MG/3ML)  0.083% nebulizer solution 2.5 mg, 2.5 mg, Nebulization, Q6H PRN, Berton Mount I, MD   aspirin EC tablet 81 mg, 81 mg, Oral, Daily, Rejeana Brock, MD, 81 mg at 04/07/20 1103   atorvastatin (LIPITOR) tablet 80 mg, 80 mg, Oral, QHS, Marvel Plan, MD, 80 mg at 04/06/20 2143   senna-docusate (Senokot-S) tablet 1 tablet, 1 tablet, Oral, QHS PRN, Skip Mayer A, MD   umeclidinium bromide (INCRUSE ELLIPTA) 62.5 MCG/INH 1 puff, 1 puff, Inhalation, Daily, Berton Mount I, MD, 1 puff at 04/06/20 5093  Patients Current Diet:  Diet Order             Diet Heart Room service appropriate? 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  Independent  Indoor Mobility: Did the patient need assistance with walking from room to room (with or without device)? Independent  Stairs: Did the patient need assistance with internal or external stairs (with or without device)? Independent  Functional Cognition: Did the patient need help planning regular tasks such as shopping or remembering to take medications? Independent  Home Assistive Devices / Equipment Home Assistive Devices/Equipment: None, Eyeglasses, Dentures (specify type) Home Equipment: None  Prior Device Use: Indicate devices/aids used by the patient prior to current illness, exacerbation or injury? None of the above  Current Functional Level Cognition  Arousal/Alertness: Awake/alert Overall Cognitive Status: No family/caregiver present to determine baseline cognitive functioning Current Attention Level: Sustained Orientation Level: Oriented X4 Following Commands: Follows one step commands consistently Safety/Judgement: Decreased awareness of safety, Decreased awareness of deficits General Comments: grossly flat affect but smiling appropriately and able to converse appropriately; slow processing and decreased safety awareness  Attention: Focused, Sustained Focused Attention: Appears intact Sustained Attention: Impaired Sustained Attention Impairment: Verbal complex Memory: Impaired Memory Impairment: Retrieval deficit, Decreased recall of new information  (Immediate: 4/5; delayed: 1/5; with cues: 4/4) Awareness: Impaired Awareness Impairment: Emergent impairment Problem Solving: Impaired Problem Solving Impairment: Verbal complex Executive Function: Reasoning, Organizing Reasoning: Impaired Reasoning Impairment: Verbal complex Organizing: Impaired Organizing Impairment: Verbal complex (Clock drawing: 0/4)    Extremity Assessment (includes Sensation/Coordination)  Upper Extremity Assessment: Generalized weakness, RUE deficits/detail RUE Deficits / Details: suspectful of inattention of R side. Weakness 4/5 in R shoulder and grip strength compared to 5/5 in L UE. Pt able to open toothpaste and brush teeth with R hand.   RUE Sensation: WNL  Lower Extremity Assessment: Defer to PT evaluation RLE Deficits / Details: mild inattention, decreased strength LLE Deficits / Details: WFL    ADLs  Overall ADL's : Needs assistance/impaired Grooming: Oral care, Standing, Min guard Grooming Details (indicate cue type and reason): Min guard for safety due to generalized weakness Upper Body Bathing: Sitting, Supervision/ safety Upper Body Bathing Details (indicate cue type and reason): supervision for safety Lower Body Bathing: Minimal assistance, Sitting/lateral leans Lower Body Bathing Details (indicate cue type and reason): Min A for LOB and lean  to L with reaching for feet Upper Body Dressing : Supervision/safety, Sitting Upper Body Dressing Details (indicate cue type and reason): Supervision for safety Lower Body Dressing: Minimal assistance, Sitting/lateral leans Lower Body Dressing Details (indicate cue type and reason): Min A for LOB and lean to L with donning socks Toilet Transfer: Min guard, Cueing for safety, Ambulation Toilet Transfer Details (indicate cue type and reason): Min guard for safety simulated to reclienr Toileting- Architect and Hygiene: Min guard, Sit to/from stand Toileting - Clothing Manipulation Details (indicate  cue type and reason): Min guard for safety Functional mobility during ADLs: Minimal assistance, Supervision/safety, Cueing for safety General ADL Comments: Min A - Supervision for safety with ADLs due to lack of cognition, decreased coordination/strength with R UE, and deficits in balance    Mobility  Overal bed mobility: Modified Independent Bed Mobility: Supine to Sit Supine to sit: Supervision Sit to supine: Supervision General bed mobility comments: use of rail and increased time/effort     Transfers  Overall transfer level: Needs assistance Equipment used: None Transfers: Sit to/from Stand Sit to Stand: Min guard, Min assist General transfer comment: min guard to stand and min A in standing due to posterior bias; no overt LOB    Ambulation / Gait / Stairs / Wheelchair Mobility  Ambulation/Gait Ambulation/Gait assistance: Editor, commissioning (Feet): 200 Feet Assistive device:  (assist at trunk with gait belt) Gait Pattern/deviations: Step-through pattern, Decreased stride length, Narrow base of support, Drifts right/left General Gait Details: assistance required for balance; LOB with turning to R side and with horizontal head turns; pt runs into objects on R side and requires cues throughout to attend to R side  Gait velocity: decreased Stairs: Yes Stairs assistance: Min assist Stair Management: No rails, Alternating pattern, One rail Left, Step to pattern, Forwards Number of Stairs: 2 General stair comments: pt initially felt she ascend steps without rail however unsteady and hit R toes on step; rail used for increased stability and cues for step to pattern     Posture / Balance Balance Overall balance assessment: Needs assistance Sitting-balance support: No upper extremity supported, Feet supported Sitting balance-Leahy Scale: Good Standing balance support: During functional activity, No upper extremity supported Standing balance-Leahy Scale: Fair Standing balance  comment: able to static stand without UE support  Standardized Balance Assessment Standardized Balance Assessment : Dynamic Gait Index Dynamic Gait Index Level Surface: Mild Impairment Change in Gait Speed: Mild Impairment Gait with Horizontal Head Turns: Moderate Impairment Gait with Vertical Head Turns: Mild Impairment Gait and Pivot Turn: Mild Impairment Step Over Obstacle: Mild Impairment Step Around Obstacles: Moderate Impairment Steps: Mild Impairment Total Score: 14    Special needs/care consideration Designated visitor is Armond an second visitor to be determined by patient   Previous Home Environment  Living Arrangements: Spouse/significant other  Lives With: Spouse Available Help at Discharge: Family, Available 24 hours/day Type of Home: House Home Layout: One level Home Access: Level entry Bathroom Shower/Tub: Engineer, manufacturing systems: Standard Bathroom Accessibility: Yes How Accessible: Accessible via walker Home Care Services: No  Discharge Living Setting Plans for Discharge Living Setting: Lives with (comment) (spouse) Type of Home at Discharge: House Discharge Home Layout: One level Discharge Home Access: Level entry Discharge Bathroom Shower/Tub: Tub/shower unit Discharge Bathroom Toilet: Standard Discharge Bathroom Accessibility: Yes How Accessible: Accessible via walker Does the patient have any problems obtaining your medications?: No  Social/Family/Support Systems Patient Roles: Spouse Contact Information: [phone] Anticipated Caregiver: Aireonna Bauer Anticipated Caregiver's Contact  Information: see above Ability/Limitations of Caregiver: none  Caregiver Availability: 24/7 Discharge Plan Discussed with Primary Caregiver: Yes Is Caregiver In Agreement with Plan?: Yes Does Caregiver/Family have Issues with Lodging/Transportation while Pt is in Rehab?: No  Goals Patient/Family Goal for Rehab: PT/OT/SLP Supervision/Mod I Expected length  of stay: 4-7 days Pt/Family Agrees to Admission and willing to participate: Yes Program Orientation Provided & Reviewed with Pt/Caregiver Including Roles  & Responsibilities: Yes  Decrease burden of Care through IP rehab admission: n/a  Possible need for SNF placement upon discharge:not anticipated  Patient Condition: This patient's condition remains as documented in the consult dated 04/05/2020, in which the Rehabilitation Physician determined and documented that the patient's condition is appropriate for intensive rehabilitative care in an inpatient rehabilitation facility. Will admit to inpatient rehab today.  Preadmission Screen Completed By:  Clois Dupes, RN, 04/07/2020 12:29 PM ______________________________________________________________________   Discussed status with Dr. Allena Katz on 04/07/2020 at  1245 and received approval for admission today.  Admission Coordinator:  Clois Dupes, time 4854 Date 04/07/2020

## 2020-04-07 NOTE — Progress Notes (Signed)
Pt arrived to unit via wc, pt is alert and able to make needs known, pt had watch and ring that daughter Ander Slade took home. Pt oriented to rehab

## 2020-04-07 NOTE — H&P (Signed)
Physical Medicine and Rehabilitation Admission H&P    Chief Complaint  Patient presents with  . 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  History taken from chart review and patient.  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  Presented on 04/03/20 with right leg hemiparesis and numbness.  CT/MRI as well as MRA of the head neck showed multiple areas of acute and subacute bilateral infarcts suggestive of embolic source.  Moderate chronic microvascular ischemic change in the white matter and pons.  Occlusion left A2 segment and mild stenosis right A2 segment.  Patient did not receive TPA.  Echocardiogram with ejection fraction of 65% with grade 1 diastolic dysfunction and no regional wall motion abnormalities.  Chest CT showed a left upper lobe and right middle lobe cavitating pulmonary nodules, pathologic mediastinal and hilar adenopathy worrisome for nodal metastasis.  Admission chemistries unremarkable except BNP 144 urinalysis negative nitrite.  Initially maintained on aspirin Plavix for CVA prophylaxis.  Plan for outpatient biopsy of pulmonary nodules.  Plavix currently remains on hold with anticipation of biopsy.  Patient is tolerating a regular diet.  Therapy evaluations completed and patient was admitted for a comprehensive rehab program.  Please see preadmission assessment from earlier today as well.  Review of Systems  Constitutional: Negative for chills and fever.  HENT: Negative for hearing loss.   Eyes: Negative for blurred vision and double vision.  Cardiovascular: Negative for chest pain and palpitations.  Gastrointestinal: Positive for constipation. Negative for heartburn, nausea and vomiting.  Genitourinary: Negative for dysuria, flank pain and hematuria.  Musculoskeletal: Positive for myalgias.  Skin: Negative  for rash.  Neurological: Positive for focal weakness and weakness. Negative for sensory change and speech change.  All other systems reviewed and are negative.  Past Medical History:  Diagnosis Date  . High cholesterol   . Stroke [facility])    Past Surgical History:  Procedure Laterality Date  . ABDOMINAL HYSTERECTOMY     No pertinent family history of premature CVA. Social History:  reports that she has been smoking cigarettes. She has never used smokeless tobacco. She reports that she does not drink alcohol and does not use drugs. Allergies: No Known Allergies Medications Prior to Admission  Medication Sig Dispense Refill  . aspirin EC 81 MG tablet Take 81 mg by mouth daily. Swallow whole.    Marland Kitchen atorvastatin (LIPITOR) 80 MG tablet Take 80 mg by mouth at bedtime.     Marland Kitchen oxyCODONE-acetaminophen (PERCOCET/ROXICET) 5-325 MG per tablet Take 1-2 tablets by mouth every 6 (six) hours as needed for pain. (Patient not taking: Reported on 04/03/2020) 15 tablet 0    Drug Regimen Review Drug regimen was reviewed and remains appropriate with no significant issues identified  Home: Home Living Family/patient expects to be discharged to:: Private residence Living Arrangements: Spouse/significant other Available Help at Discharge: Family, Available 24 hours/day Type of Home: House Home Access: Level entry Home Layout: One level Bathroom Shower/Tub: Engineer, manufacturing systems: Standard Home Equipment: None  Lives With: Significant other   Functional History: Prior Function Level of Independence: Independent 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  Functional Status:  Mobility: Bed Mobility Overal bed mobility: Modified Independent Bed Mobility: Supine to Sit Supine to sit: Supervision Sit to supine: Supervision General bed mobility comments: use of rail and increased time/effort  Transfers Overall  transfer level: Needs assistance Equipment used: None Transfers:  Sit to/from Stand Sit to Stand: Min guard, Min assist General transfer comment: min guard to stand and min A in standing due to posterior bias; no overt LOB Ambulation/Gait Ambulation/Gait assistance: Min assist Gait Distance (Feet): 200 Feet Assistive device:  (assist at trunk with gait belt) Gait Pattern/deviations: Step-through pattern, Decreased stride length, Narrow base of support, Drifts right/left General Gait Details: assistance required for balance; LOB with turning to R side and with horizontal head turns; pt runs into objects on R side and requires cues throughout to attend to R side  Gait velocity: decreased Stairs: Yes Stairs assistance: Min assist Stair Management: No rails, Alternating pattern, One rail Left, Step to pattern, Forwards Number of Stairs: 2 General stair comments: pt initially felt she ascend steps without rail however unsteady and hit R toes on step; rail used for increased stability and cues for step to pattern     ADL: ADL Overall ADL's : Needs assistance/impaired Grooming: Oral care, Standing, Min guard Grooming Details (indicate cue type and reason): Min guard for safety due to generalized weakness Upper Body Bathing: Sitting, Supervision/ safety Upper Body Bathing Details (indicate cue type and reason): supervision for safety Lower Body Bathing: Minimal assistance, Sitting/lateral leans Lower Body Bathing Details (indicate cue type and reason): Min A for LOB and lean to L with reaching for feet Upper Body Dressing : Supervision/safety, Sitting Upper Body Dressing Details (indicate cue type and reason): Supervision for safety Lower Body Dressing: Minimal assistance, Sitting/lateral leans Lower Body Dressing Details (indicate cue type and reason): Min A for LOB and lean to L with donning socks Toilet Transfer: Min guard, Cueing for safety, Ambulation Toilet Transfer Details (indicate cue type and reason): Min guard for safety simulated to  reclienr Toileting- Architect and Hygiene: Min guard, Sit to/from stand Toileting - Clothing Manipulation Details (indicate cue type and reason): Min guard for safety Functional mobility during ADLs: Minimal assistance, Supervision/safety, Cueing for safety General ADL Comments: Min A - Supervision for safety with ADLs due to lack of cognition, decreased coordination/strength with R UE, and deficits in balance  Cognition: Cognition Overall Cognitive Status: No family/caregiver present to determine baseline cognitive functioning Arousal/Alertness: Awake/alert Orientation Level: Oriented X4 Attention: Focused, Sustained Focused Attention: Appears intact Sustained Attention: Impaired Sustained Attention Impairment: Verbal complex Memory: Impaired Memory Impairment: Retrieval deficit, Decreased recall of new information (Immediate: 4/5; delayed: 1/5; with cues: 4/4) Awareness: Impaired Awareness Impairment: Emergent impairment Problem Solving: Impaired Problem Solving Impairment: Verbal complex Executive Function: Reasoning, Organizing Reasoning: Impaired Reasoning Impairment: Verbal complex Organizing: Impaired Organizing Impairment: Verbal complex (Clock drawing: 0/4) Cognition Arousal/Alertness: Awake/alert Behavior During Therapy: Flat affect Overall Cognitive Status: No family/caregiver present to determine baseline cognitive functioning Area of Impairment: Problem solving Current Attention Level: Sustained Following Commands: Follows one step commands consistently Safety/Judgement: Decreased awareness of safety, Decreased awareness of deficits Awareness: Emergent Problem Solving: Decreased initiation, Slow processing, Requires verbal cues General Comments: grossly flat affect but smiling appropriately and able to converse appropriately; slow processing and decreased safety awareness   Physical Exam: Blood pressure 129/90, pulse 63, temperature 98.3 F (36.8 C),  temperature source Oral, resp. rate 18, height 5\' 5"  (1.651 m), weight 73.8 kg, SpO2 92 %. Physical Exam Vitals reviewed.  Constitutional:      General: She is not in acute distress.    Appearance: She is normal weight.  HENT:     Head: Normocephalic and atraumatic.     Right  Ear: External ear normal.     Left Ear: External ear normal.     Nose: Nose normal.  Eyes:     General:        Right eye: No discharge.        Left eye: No discharge.     Extraocular Movements: Extraocular movements intact.  Pulmonary:     Effort: Pulmonary effort is normal. No respiratory distress.     Breath sounds: No stridor.  Abdominal:     General: Abdomen is flat. There is no distension.  Musculoskeletal:     Cervical back: Neck supple.     Comments: Motor: RUE/RLE: 3/5 proximal distal LUE/LLE: 5/5 proximal distal Sensation intact light touch  Skin:    General: Skin is warm and dry.  Neurological:     Mental Status: She is alert.     Comments: Patient is alert in no acute distress.  Oriented x3 and follows commands.  Fair awareness of deficits.  Psychiatric:        Mood and Affect: Affect is blunt and flat.     Comments: ?  Some delay in speech     No results found for this or any previous visit (from the past 48 hour(s)). No results found.     Medical Problem List and Plan: 1.  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  Admit to CIR 2.  Antithrombotics: -DVT/anticoagulation: SCDs  -antiplatelet therapy: Aspirin 81 mg daily.  Plavix on hold for planned outpatient biopsy and bronchoscopy 3. Pain Management: Tylenol as needed 4. Mood: Provide emotional support  -antipsychotic agents: N/A 5. Neuropsych: This patient is capable of making decisions on her own behalf. 6. Skin/Wound Care: Routine skin checks 7. Fluids/Electrolytes/Nutrition: Routine in and outs.  CMP ordered. 8.  Left  upper lobe and right middle lobe cavitating pulmonary nodules. Plan outpatient biopsy and bronchoscopy 9.  Tobacco abuse.  Counsel 10.  Hyperlipidemia.  Lipitor 11.  Constipation.  Adjust bowel meds as necessary  Charlton Amor, PA-C 04/07/2020  I have personally performed a face to face diagnostic evaluation, including, but not limited to relevant history and physical exam findings, of this patient and developed relevant assessment and plan.  Additionally, I have reviewed and concur with the physician assistant's documentation above.  Maryla Morrow, MD, ABPMR  The patient's status has not changed. The original post admission physician evaluation remains appropriate, and any changes from the pre-admission screening or documentation from the acute chart are noted above.   Maryla Morrow, MD, ABPMR

## 2020-04-07 NOTE — Progress Notes (Signed)
Pt. Being transferred to rehab via wheelchair. Pt. Has a watch and 3 rings. Placed in pink denture cup, pt. Wants to hold cup during transport, so given to her.

## 2020-04-07 NOTE — Progress Notes (Signed)
Horton Chinaulkar, Krutika P, MD  Physician  Physical Medicine and Rehabilitation  Consult Note      Signed  Date of Service:  04/05/2020  6:24 AM      Related encounter: ED to Hosp-Admission (Discharged) from 04/03/2020 in [facility]      Signed      Expand All Collapse All  Show:Clear all [x] Manual[x] Template[] Copied  Added by: [x] Angiulli, Mcarthur Rossettianiel J, PA-C[x] Raulkar, Drema PryKrutika P, MD  [] Hover for details          Physical Medicine and Rehabilitation Consult Reason for Consult: Right side weakness Referring Physician: [facility]     HPI: Jessica [name] is a 59 y.o. 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  Patient lives with spouse.  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  1 level home.  Presented 04/03/2020 with right leg weakness and numbness.  CT/MRI as well as MRA head and neck showed multiple areas of acute and subacute infarct in the brain bilaterally suggestive of emboli.  Moderate chronic microvascular ischemic change in the white matter and pons.  Occlusion left A2 segment and mild stenosis right A2 segment.  Patient did not receive TPA.  Echo with ejection fraction of 65% grade 1 diastolic dysfunction no regional wall motion abnormalities.  Chest CT showed left upper lobe and right middle lobe cavitating pulmonary nodule, pathologic mediastinal and hilar adenopathy worrisome for nodal metastasis admission chemistries unremarkable, BNP 144, urinalysis negative nitrite.  Currently maintained on aspirin and Plavix therapy for CVA prophylaxis.  Awaiting plan for possible need for TEE.  Oncology services to follow-up in regards to pulmonary nodules question plan need for biopsy.  Therapy evaluations completed with recommendations of physical medicine rehab consult     Review of Systems  Constitutional: Negative for chills and fever.  HENT: Negative for hearing loss.   Eyes: Negative for  blurred vision and double vision.  Respiratory: Negative for cough and shortness of breath.   Cardiovascular: Negative for chest pain, palpitations and leg swelling.  Gastrointestinal: Positive for constipation. Negative for heartburn, nausea and vomiting.  Genitourinary: Negative for dysuria, flank pain and hematuria.  Musculoskeletal: Positive for joint pain and myalgias.  Skin: Negative for rash.  Neurological: Positive for sensory change and weakness.  All other systems reviewed and are negative.       Past Medical History:  Diagnosis Date  . High cholesterol    . Stroke [facility](HCC)           Past Surgical History:  Procedure Laterality Date  . ABDOMINAL HYSTERECTOMY        No family history on file. Social History:  reports that she has been smoking cigarettes. She has never used smokeless tobacco. She reports that she does not drink alcohol and does not use drugs. Allergies: No Known Allergies Medications Prior to Admission  Medication Sig Dispense Refill  . aspirin EC 81 MG tablet Take 81 mg by mouth daily. Swallow whole.      Marland Kitchen. atorvastatin (LIPITOR) 80 MG tablet Take 80 mg by mouth at bedtime.       Marland Kitchen. oxyCODONE-acetaminophen (PERCOCET/ROXICET) 5-325 MG per tablet Take 1-2 tablets by mouth every 6 (six) hours as needed for pain. (Patient not taking: Reported on 04/03/2020) 15 tablet 0      Home: Home Living Family/patient expects to be discharged to:: Private residence Living Arrangements: Spouse/significant other Available Help at Discharge: Family, Available 24 hours/day Type of Home: House Home  Access: Level entry Home Layout: One level Bathroom Shower/Tub: Engineer, manufacturing systems: Standard Home Equipment: None  Lives With: Significant other  Functional History: Prior Function Level of Independence: Independent Comments: works as an Database administrator Status:  Mobility: Bed Mobility Overal bed mobility: Needs Assistance Bed Mobility: Supine  to Sit, Sit to Supine Supine to sit: Min assist Sit to supine: Min assist General bed mobility comments: Min A for trunk and LE management; cueing for posture and sequencing Transfers Overall transfer level: Needs assistance Equipment used: 1 person hand held assist Transfers: Sit to/from Stand Sit to Stand: Min assist, Mod assist General transfer comment: up to Mod A to power up from bedside and toilet - heavy forward flexion; cueing for sequencing Ambulation/Gait Ambulation/Gait assistance: Min assist Gait Distance (Feet): 20 Feet Assistive device: 1 person hand held assist Gait Pattern/deviations: Step-to pattern, Decreased stride length, Trunk flexed, Narrow base of support General Gait Details: unsteady gait pattern with 1 HHA - mild inattention to R side with cueing for safety and sequencing throughout Gait velocity: decreased   ADL:   Cognition: Cognition Overall Cognitive Status: Impaired/Different from baseline Arousal/Alertness: Awake/alert Orientation Level: Oriented X4 Attention: Focused, Sustained Focused Attention: Appears intact Sustained Attention: Impaired Sustained Attention Impairment: Verbal complex Memory: Impaired Memory Impairment: Retrieval deficit, Decreased recall of new information (Immediate: 4/5; delayed: 1/5; with cues: 4/4) Awareness: Impaired Awareness Impairment: Emergent impairment Problem Solving: Impaired Problem Solving Impairment: Verbal complex Executive Function: Reasoning, Organizing Reasoning: Impaired Reasoning Impairment: Verbal complex Organizing: Impaired Organizing Impairment: Verbal complex (Clock drawing: 0/4) Cognition Arousal/Alertness: Awake/alert Behavior During Therapy: WFL for tasks assessed/performed, Flat affect Overall Cognitive Status: Impaired/Different from baseline General Comments: slow with answering, however correct   Blood pressure (!) 145/80, pulse 72, temperature 98.2 F (36.8 C), temperature source  Oral, resp. rate 19, height  (1.651 m), weight 73.8 kg, SpO2 92 %.   General: Alert and oriented x 3, No apparent distress HEENT: Head is normocephalic, atraumatic, PERRLA, EOMI, sclera anicteric, oral mucosa pink and moist, dentition intact, ext ear canals clear,  Neck: Supple without JVD or lymphadenopathy Heart: Reg rate and rhythm. No murmurs rubs or gallops Chest: CTA bilaterally without wheezes, rales, or rhonchi; no distress Abdomen: Soft, non-tender, non-distended, bowel sounds positive. Extremities: No clubbing, cyanosis, or edema. Pulses are 2+ Skin: Clean and intact without signs of breakdown Neuro: Pt is cognitively appropriate with normal insight, memory, and awareness, but with psychomotor slowing. Cranial nerves 2-12 are intact with the exception of right sided facial droop. Sensory exam is normal. Reflexes are 2+ in all 4's. Fine motor coordination is intact. No tremors. Motor function is grossly 5/5 on left side, 4/5 on right side.  Psych: Pt's affect is appropriate. Pt is cooperative     Lab Results Last 24 Hours       Results for orders placed or performed during the hospital encounter of 04/03/20 (from the past 24 hour(s))  Urinalysis, Complete w Microscopic     Status: Abnormal    Collection Time: 04/04/20  6:44 AM  Result Value Ref Range    Color, Urine YELLOW YELLOW    APPearance CLEAR CLEAR    Specific Gravity, Urine 1.012 1.005 - 1.030    pH 6.0 5.0 - 8.0    Glucose, UA NEGATIVE NEGATIVE mg/dL    Hgb urine dipstick LARGE (A) NEGATIVE    Bilirubin Urine NEGATIVE NEGATIVE    Ketones, ur NEGATIVE NEGATIVE mg/dL    Protein, ur NEGATIVE NEGATIVE  mg/dL    Nitrite NEGATIVE NEGATIVE    Leukocytes,Ua NEGATIVE NEGATIVE    RBC / HPF >50 (H) 0 - 5 RBC/hpf    WBC, UA 0-5 0 - 5 WBC/hpf    Bacteria, UA RARE (A) NONE SEEN    Squamous Epithelial / LPF 0-5 0 - 5    Mucus PRESENT    C-reactive protein     Status: None    Collection Time: 04/04/20  7:33 AM  Result  Value Ref Range    CRP 0.5 <1.0 mg/dL  Sedimentation rate     Status: None    Collection Time: 04/04/20  7:33 AM  Result Value Ref Range    Sed Rate 6 0 - 22 mm/hr  Vitamin B12     Status: None    Collection Time: 04/04/20  7:33 AM  Result Value Ref Range    Vitamin B-12 407 180 - 914 pg/mL  RPR     Status: None    Collection Time: 04/04/20  7:33 AM  Result Value Ref Range    RPR Ser Ql NON REACTIVE NON REACTIVE  TSH     Status: None    Collection Time: 04/04/20  7:33 AM  Result Value Ref Range    TSH 1.083 0.350 - 4.500 uIU/mL  Rapid urine drug screen (hospital performed)     Status: None    Collection Time: 04/04/20  8:47 AM  Result Value Ref Range    Opiates NONE DETECTED NONE DETECTED    Cocaine NONE DETECTED NONE DETECTED    Benzodiazepines NONE DETECTED NONE DETECTED    Amphetamines NONE DETECTED NONE DETECTED    Tetrahydrocannabinol NONE DETECTED NONE DETECTED    Barbiturates NONE DETECTED NONE DETECTED       Imaging Results (Last 48 hours)  DG Chest 2 View   Result Date: 04/03/2020 CLINICAL DATA:  TIA EXAM: CHEST - 2 VIEW COMPARISON:  Radiograph 01/20/2017 FINDINGS: Low lung volumes with streaky basilar areas of atelectasis. A more nodular masslike opacity is present in the right mid to lower lung. There is mild central vascular congestion and central airways thickening. No pneumothorax or visible effusion. The aorta is calcified. The remaining cardiomediastinal contours are unremarkable. No acute osseous or soft tissue abnormality. IMPRESSION: 1. Low lung volumes with streaky basilar atelectasis. 2. Additional central vascular congestion, airways thickening, and few septal lines could reflect developing interstitial edema. 3. Nodular opacity in the right mid to lower lung, recommend further evaluation with CT imaging. Electronically Signed   By: Kreg Shropshire M.D.   On: 04/03/2020 21:36    CT Head Wo Contrast   Result Date: 04/03/2020 CLINICAL DATA:  Right-sided  weakness/numbness which has resolved. Previous history of coil embolization of intracerebral aneurysms. EXAM: CT HEAD WITHOUT CONTRAST TECHNIQUE: Contiguous axial images were obtained from the base of the skull through the vertex without intravenous contrast. COMPARISON:  01/20/2017 FINDINGS: Brain: Ventricles, cisterns and other CSF spaces are within normal. There is evidence of chronic ischemic microvascular disease. There is a small old left occipital infarct. Possible old lacunar infarct over the left cerebellum. Moderate streak artifact from patient's previous aneurysm coiling involving bilateral posterior communicating arteries as well as region of the anterior communicating artery. No evidence of mass, mass effect or acute hemorrhage. No evidence of acute infarction. Vascular: Embolization coils as described. Skull: Normal. Negative for fracture or focal lesion. Sinuses/Orbits: No acute finding. Other: None. IMPRESSION: 1.  No acute findings. 2. 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 3. Chronic ischemic microvascular disease. Possible old left cerebellar lacunar infarct as well as small old left occipital infarct. Electronically Signed   By: Elberta Fortis M.D.   On: 04/03/2020 10:54    CT CHEST WO CONTRAST   Result Date: 04/04/2020 CLINICAL DATA:  Shortness of breath, right lung nodularity on chest x-ray EXAM: CT CHEST WITHOUT CONTRAST TECHNIQUE: Multidetector CT imaging of the chest was performed following the standard protocol without IV contrast. COMPARISON:  04/03/2020 FINDINGS: Cardiovascular: Unenhanced imaging of the heart and great vessels demonstrates no pericardial effusion. Mild atherosclerosis of the thoracic aorta. Mediastinum/Nodes: Pathologic mediastinal adenopathy is identified. Largest lymph node in the AP window measures 13 mm in short axis, reference image 52/3. Precarinal adenopathy measures up to 14 mm in short axis, reference image  56/3. Subcarinal and hilar adenopathy are also identified, more difficult to measure given lack of IV contrast. The thyroid, trachea, and esophagus are grossly unremarkable. Lungs/Pleura: Spiculated left upper lobe mass is identified with peripheral cavitation, measuring up to 3.3 x 2.2 cm reference image 48/4. There is a second cavitating nodule within the lateral segment right middle lobe measuring 2.0 x 1.9 cm reference image 98/4. This corresponds to chest x-ray finding. There is background emphysema. No effusion or pneumothorax. Dependent hypoventilatory changes are noted. Upper Abdomen: Indeterminate 10 mm nodule within the left adrenal gland is noted. The remainder of the upper abdomen is unremarkable. Musculoskeletal: No acute or destructive bony lesions. Reconstructed images demonstrate no additional findings. IMPRESSION: 1. 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 The left upper lobe nodule is spiculated. Malignancy is the diagnosis of exclusion. Far less likely cavitating infection or septic emboli could be considered in the appropriate setting. 2. Pathologic mediastinal and hilar adenopathy worrisome for nodal metastases. 3. Nonspecific left adrenal nodule, metastatic disease not excluded. 4. Aortic Atherosclerosis (ICD10-I70.0) and Emphysema (ICD10-J43.9). These results will be called to the ordering clinician or representative by the Radiologist Assistant, and communication documented in the PACS or [employer]. Electronically Signed   By: Sharlet Salina M.D.   On: 04/04/2020 01:41    MR ANGIO HEAD WO CONTRAST   Result Date: 04/03/2020 CLINICAL DATA:  TIA. Right-sided numbness and weakness. History of aneurysm coiling. EXAM: MRI HEAD WITHOUT AND WITH CONTRAST MRA HEAD WITHOUT   CONTRAST MRA NECK WITHOUT   CONTRAST TECHNIQUE: Multiplanar, multiecho pulse sequences of the brain and surrounding structures were obtained without and with intravenous contrast. Angiographic  images of the head were obtained using MRA technique without contrast. Angiographic sequences of the neck and surrounding structures were obtained without intravenous contrast. CONTRAST:  7.60mL GADAVIST GADOBUTROL 1 MMOL/ML IV SOLN COMPARISON:  None. FINDINGS: MRI HEAD FINDINGS Brain: Multiple areas of recent infarction bilaterally. Acute/subacute infarct in the left frontal lobe over the convexity involving cortex and white matter. Cortical enhancement over the left frontal convexity without restricted diffusion suggesting late subacute infarct. Small area of acute or subacute infarct right medial parietal lobe. Subacute infarct with mild enhancement in the left parietal lobe. Cluster of small acute infarcts in the right posterior cerebellum with mild enhancement. Moderate chronic microvascular ischemic change in the white matter and pons. Mild atrophy without hydrocephalus. No intracranial hemorrhage or mass. Vascular: Normal arterial flow voids. Skull and upper cervical spine: No focal skeletal lesion. Sinuses/Orbits: Paranasal sinuses clear.  Normal orbit Other: None MRA HEAD FINDINGS Both vertebral arteries patent to the basilar. Left PICA patent. Right PICA not visualized but  there is a right AICA which may contribute to this territory. Basilar widely patent. Fetal origin right posterior cerebral artery. Mild stenosis right P2 segment. Left posterior cerebral artery widely patent. Internal carotid artery patent bilaterally without stenosis. Hypoplastic right A1 segment which is small. Left A1 widely patent. Occlusion left A2 segment. Mild stenosis right A2 segment. Middle cerebral arteries patent bilaterally without significant stenosis. MRA NECK FINDINGS Antegrade flow in carotid and vertebral arteries bilaterally. Carotid bifurcation patent bilaterally without stenosis. Both vertebral arteries are patent without stenosis. IMPRESSION: 1. Multiple areas of acute and subacute infarct in the brain bilaterally  suggestive of emboli. 2. Moderate chronic microvascular ischemic change in the white matter and pons. 3. Occlusion left A2 segment and mild stenosis right A2 segment. Hypoplastic right A1 segment. 4. Mild stenosis right P2 segment. Middle cerebral arteries patent bilaterally. Electronically Signed   By: Marlan Palau M.D.   On: 04/03/2020 14:38    MR ANGIO NECK WO CONTRAST   Result Date: 04/03/2020 CLINICAL DATA:  TIA. Right-sided numbness and weakness. History of aneurysm coiling. EXAM: MRI HEAD WITHOUT AND WITH CONTRAST MRA HEAD WITHOUT   CONTRAST MRA NECK WITHOUT   CONTRAST TECHNIQUE: Multiplanar, multiecho pulse sequences of the brain and surrounding structures were obtained without and with intravenous contrast. Angiographic images of the head were obtained using MRA technique without contrast. Angiographic sequences of the neck and surrounding structures were obtained without intravenous contrast. CONTRAST:  7.26mL GADAVIST GADOBUTROL 1 MMOL/ML IV SOLN COMPARISON:  None. FINDINGS: MRI HEAD FINDINGS Brain: Multiple areas of recent infarction bilaterally. Acute/subacute infarct in the left frontal lobe over the convexity involving cortex and white matter. Cortical enhancement over the left frontal convexity without restricted diffusion suggesting late subacute infarct. Small area of acute or subacute infarct right medial parietal lobe. Subacute infarct with mild enhancement in the left parietal lobe. Cluster of small acute infarcts in the right posterior cerebellum with mild enhancement. Moderate chronic microvascular ischemic change in the white matter and pons. Mild atrophy without hydrocephalus. No intracranial hemorrhage or mass. Vascular: Normal arterial flow voids. Skull and upper cervical spine: No focal skeletal lesion. Sinuses/Orbits: Paranasal sinuses clear.  Normal orbit Other: None MRA HEAD FINDINGS Both vertebral arteries patent to the basilar. Left PICA patent. Right PICA not visualized but  there is a right AICA which may contribute to this territory. Basilar widely patent. Fetal origin right posterior cerebral artery. Mild stenosis right P2 segment. Left posterior cerebral artery widely patent. Internal carotid artery patent bilaterally without stenosis. Hypoplastic right A1 segment which is small. Left A1 widely patent. Occlusion left A2 segment. Mild stenosis right A2 segment. Middle cerebral arteries patent bilaterally without significant stenosis. MRA NECK FINDINGS Antegrade flow in carotid and vertebral arteries bilaterally. Carotid bifurcation patent bilaterally without stenosis. Both vertebral arteries are patent without stenosis. IMPRESSION: 1. Multiple areas of acute and subacute infarct in the brain bilaterally suggestive of emboli. 2. Moderate chronic microvascular ischemic change in the white matter and pons. 3. Occlusion left A2 segment and mild stenosis right A2 segment. Hypoplastic right A1 segment. 4. Mild stenosis right P2 segment. Middle cerebral arteries patent bilaterally. Electronically Signed   By: Marlan Palau M.D.   On: 04/03/2020 14:38    MR Brain W and Wo Contrast   Result Date: 04/03/2020 CLINICAL DATA:  TIA. Right-sided numbness and weakness. History of aneurysm coiling. EXAM: MRI HEAD WITHOUT AND WITH CONTRAST MRA HEAD WITHOUT   CONTRAST MRA NECK WITHOUT   CONTRAST TECHNIQUE: Multiplanar, multiecho  pulse sequences of the brain and surrounding structures were obtained without and with intravenous contrast. Angiographic images of the head were obtained using MRA technique without contrast. Angiographic sequences of the neck and surrounding structures were obtained without intravenous contrast. CONTRAST:  7.21mL GADAVIST GADOBUTROL 1 MMOL/ML IV SOLN COMPARISON:  None. FINDINGS: MRI HEAD FINDINGS Brain: Multiple areas of recent infarction bilaterally. Acute/subacute infarct in the left frontal lobe over the convexity involving cortex and white matter. Cortical enhancement  over the left frontal convexity without restricted diffusion suggesting late subacute infarct. Small area of acute or subacute infarct right medial parietal lobe. Subacute infarct with mild enhancement in the left parietal lobe. Cluster of small acute infarcts in the right posterior cerebellum with mild enhancement. Moderate chronic microvascular ischemic change in the white matter and pons. Mild atrophy without hydrocephalus. No intracranial hemorrhage or mass. Vascular: Normal arterial flow voids. Skull and upper cervical spine: No focal skeletal lesion. Sinuses/Orbits: Paranasal sinuses clear.  Normal orbit Other: None MRA HEAD FINDINGS Both vertebral arteries patent to the basilar. Left PICA patent. Right PICA not visualized but there is a right AICA which may contribute to this territory. Basilar widely patent. Fetal origin right posterior cerebral artery. Mild stenosis right P2 segment. Left posterior cerebral artery widely patent. Internal carotid artery patent bilaterally without stenosis. Hypoplastic right A1 segment which is small. Left A1 widely patent. Occlusion left A2 segment. Mild stenosis right A2 segment. Middle cerebral arteries patent bilaterally without significant stenosis. MRA NECK FINDINGS Antegrade flow in carotid and vertebral arteries bilaterally. Carotid bifurcation patent bilaterally without stenosis. Both vertebral arteries are patent without stenosis. IMPRESSION: 1. Multiple areas of acute and subacute infarct in the brain bilaterally suggestive of emboli. 2. Moderate chronic microvascular ischemic change in the white matter and pons. 3. Occlusion left A2 segment and mild stenosis right A2 segment. Hypoplastic right A1 segment. 4. Mild stenosis right P2 segment. Middle cerebral arteries patent bilaterally. Electronically Signed   By: Marlan Palau M.D.   On: 04/03/2020 14:38    US RENAL   Result Date: 04/04/2020 CLINICAL DATA:  Hematuria EXAM: RENAL / URINARY TRACT ULTRASOUND  COMPLETE COMPARISON:  CT dated March 15, 2015 FINDINGS: Right Kidney: Renal measurements: 11.2 x 5.1 x 4.4 cm = volume: 130 mL . Echogenicity within normal limits. No mass or hydronephrosis visualized. A small 1.4 cm cyst is noted. Left Kidney: Renal measurements: 10.2 x 5.1 x 4.7 cm = volume: 126 mL. Echogenicity within normal limits. No mass or hydronephrosis visualized. Bladder: Appears normal for degree of bladder distention. Other: None. IMPRESSION: Unremarkable study. Electronically Signed   By: Katherine Mantle M.D.   On: 04/04/2020 21:46    ECHOCARDIOGRAM COMPLETE   Result Date: 04/04/2020    ECHOCARDIOGRAM REPORT   Patient Name:   Jessica [name] Date of Exam: 04/04/2020 Medical Rec #:  [id number]    Height:       65.0 in Accession #:    [id number]   Weight:       162.8 lb Date of Birth:  06-02-1961    BSA:          1.812 m Patient Age:    59 years     BP:           162/87 mmHg Patient Gender: F            HR:           74 bpm. Exam Location:  Inpatient Procedure: 2D Echo, Color Doppler, Cardiac Doppler  and Intracardiac            Opacification Agent Indications:    TIA  History:        Patient has no prior history of Echocardiogram examinations.  Sonographer:    Roosvelt Maser RDCS Referring Phys: [id number] SARA-MAIZ A THOMAS IMPRESSIONS  1. Left ventricular ejection fraction, by estimation, is 60 to 65%. The left ventricle has normal function. The left ventricle has no regional wall motion abnormalities. Left ventricular diastolic parameters are consistent with Grade I diastolic dysfunction (impaired relaxation).  2. Right ventricular systolic function is normal. The right ventricular size is normal.  3. The mitral valve is normal in structure. No evidence of mitral valve regurgitation. No evidence of mitral stenosis.  4. The aortic valve is normal in structure. Aortic valve regurgitation is mild. No aortic stenosis is present.  5. The inferior vena cava is normal in size with greater than 50% respiratory  variability, suggesting right atrial pressure of 3 mmHg. FINDINGS  Left Ventricle: Left ventricular ejection fraction, by estimation, is 60 to 65%. The left ventricle has normal function. The left ventricle has no regional wall motion abnormalities. Definity contrast agent was given IV to delineate the left ventricular  endocardial borders. The left ventricular internal cavity size was normal in size. There is no left ventricular hypertrophy. Left ventricular diastolic parameters are consistent with Grade I diastolic dysfunction (impaired relaxation). Normal left ventricular filling pressure. Right Ventricle: The right ventricular size is normal. No increase in right ventricular wall thickness. Right ventricular systolic function is normal. Left Atrium: Left atrial size was normal in size. Right Atrium: Right atrial size was normal in size. Pericardium: There is no evidence of pericardial effusion. Mitral Valve: The mitral valve is normal in structure. Normal mobility of the mitral valve leaflets. No evidence of mitral valve regurgitation. No evidence of mitral valve stenosis. Tricuspid Valve: The tricuspid valve is normal in structure. Tricuspid valve regurgitation is not demonstrated. No evidence of tricuspid stenosis. Aortic Valve: The aortic valve is normal in structure. Aortic valve regurgitation is mild. No aortic stenosis is present. Pulmonic Valve: The pulmonic valve was normal in structure. Pulmonic valve regurgitation is not visualized. No evidence of pulmonic stenosis. Aorta: The aortic root is normal in size and structure. Venous: The inferior vena cava is normal in size with greater than 50% respiratory variability, suggesting right atrial pressure of 3 mmHg. IAS/Shunts: No atrial level shunt detected by color flow Doppler.  LEFT VENTRICLE PLAX 2D LVIDd:         4.30 cm  Diastology LVIDs:         2.80 cm  LV e' lateral:   6.31 cm/s LV PW:         0.80 cm  LV E/e' lateral: 8.7 LV IVS:        0.80 cm  LV  e' medial:    5.00 cm/s LVOT diam:     1.90 cm  LV E/e' medial:  11.0 LVOT Area:     2.84 cm  RIGHT VENTRICLE          IVC RV Basal diam:  2.80 cm  IVC diam: 1.20 cm LEFT ATRIUM           Index       RIGHT ATRIUM           Index LA diam:      2.20 cm 1.21 cm/m  RA Area:     16.70 cm LA Vol (A4C): 68.6 ml  37.85 ml/m RA Volume:   45.60 ml  25.16 ml/m   AORTA Ao Root diam: 2.80 cm Ao Asc diam:  2.90 cm MITRAL VALVE MV Area (PHT): 4.31 cm    SHUNTS MV Decel Time: 176 msec    Systemic Diam: 1.90 cm MV E velocity: 54.80 cm/s MV A velocity: 79.70 cm/s MV E/A ratio:  0.69 Mihai Croitoru MD Electronically signed by Thurmon Fair MD Signature Date/Time: 04/04/2020/5:21:05 PM    Final     VAS [place] LOWER EXTREMITY VENOUS (DVT)   Result Date: 04/04/2020  Lower Venous DVTStudy Indications: Stroke.  Limitations: Positioning. Comparison Study: No prior study on file Performing Technologist: Sherren Kerns RVS  Examination Guidelines: A complete evaluation includes B-mode imaging, spectral Doppler, color Doppler, and power Doppler as needed of all accessible portions of each vessel. Bilateral testing is considered an integral part of a complete examination. Limited examinations for reoccurring indications may be performed as noted. The reflux portion of the exam is performed with the patient in reverse Trendelenburg.  +---------+---------------+---------+-----------+----------+--------------+ RIGHT    CompressibilityPhasicitySpontaneityPropertiesThrombus Aging +---------+---------------+---------+-----------+----------+--------------+ CFV      Full           Yes      Yes                                 +---------+---------------+---------+-----------+----------+--------------+ SFJ      Full                                                        +---------+---------------+---------+-----------+----------+--------------+ FV Prox  Full                                                         +---------+---------------+---------+-----------+----------+--------------+ FV Mid   Full                                                        +---------+---------------+---------+-----------+----------+--------------+ FV DistalFull                                                        +---------+---------------+---------+-----------+----------+--------------+ PFV      Full                                                        +---------+---------------+---------+-----------+----------+--------------+ POP      Full           Yes      Yes                                 +---------+---------------+---------+-----------+----------+--------------+  PTV      Full                                                        +---------+---------------+---------+-----------+----------+--------------+ PERO     Full                                                        +---------+---------------+---------+-----------+----------+--------------+   +---------+---------------+---------+-----------+----------+--------------+ LEFT     CompressibilityPhasicitySpontaneityPropertiesThrombus Aging +---------+---------------+---------+-----------+----------+--------------+ CFV      Full           Yes      Yes                                 +---------+---------------+---------+-----------+----------+--------------+ SFJ      Full                                                        +---------+---------------+---------+-----------+----------+--------------+ FV Prox  Full                                                        +---------+---------------+---------+-----------+----------+--------------+ FV Mid   Full                                                        +---------+---------------+---------+-----------+----------+--------------+ FV DistalFull                                                         +---------+---------------+---------+-----------+----------+--------------+ PFV      Full                                                        +---------+---------------+---------+-----------+----------+--------------+ POP      Full           Yes      Yes                                 +---------+---------------+---------+-----------+----------+--------------+ PTV      Full                                                        +---------+---------------+---------+-----------+----------+--------------+  PERO     Full                                                        +---------+---------------+---------+-----------+----------+--------------+     Summary: BILATERAL: - No evidence of deep vein thrombosis seen in the lower extremities, bilaterally. -   *See table(s) above for measurements and observations. Electronically signed by Waverly Ferrari MD on 04/04/2020 at 6:48:44 PM.    Final          Assessment/Plan: Diagnosis: Multiple bilateral anterior and posterior infarcts, embolic 1. Does the need for close, 24 hr/day medical supervision in concert with the patient's rehab needs make it unreasonable for this patient to be served in a less intensive setting? Yes 2. Co-Morbidities requiring supervision/potential complications: left upper lobe and right middle lobe cavitary pulmonary nodules worrisome for nodal metastases, nonspecific left adrenal nodule, HTN, HLD, tobacco abuse 3. Due to bladder management, bowel management, safety, skin/wound care, disease management, medication administration, pain management and patient education, does the patient require 24 hr/day rehab nursing? Yes 4. Does the patient require coordinated care of a physician, rehab nurse, therapy disciplines of PT, OT, SLP to address physical and functional deficits in the context of the above medical diagnosis(es)? Yes Addressing deficits in the following areas: balance, endurance, locomotion,  strength, transferring, bowel/bladder control, bathing, dressing, feeding, grooming, toileting, speech and psychosocial support 5. Can the patient actively participate in an intensive therapy program of at least 3 hrs of therapy per day at least 5 days per week? Yes 6. The potential for patient to make measurable gains while on inpatient rehab is excellent 7. Anticipated functional outcomes upon discharge from inpatient rehab are modified independent  with PT, modified independent with OT, modified independent with SLP. 8. Estimated rehab length of stay to reach the above functional goals is: 10-12 days 9. Anticipated discharge destination: Home 10. Overall Rehab/Functional Prognosis: excellent   RECOMMENDATIONS: This patient's condition is appropriate for continued rehabilitative care in the following setting: CIR Patient has agreed to participate in recommended program. Yes Note that insurance prior authorization may be required for reimbursement for recommended care.   Comment: Mrs. Casavant would be an excellent CIR candidate once medially stable (biopsy pending). She has the social support of her sister. Thank you for this consult. We will continue to follow in Mrs. Fitzmaurice's care.    Mcarthur Rossetti Angiulli, PA-C 04/05/2020    I have personally performed a face to face diagnostic evaluation, including, but not limited to relevant history and physical exam findings, of this patient and developed relevant assessment and plan.  Additionally, I have reviewed and concur with the physician assistant's documentation above.   Sula Soda, MD        Revision History                     Routing History           Note Details  Author Carlis Abbott, Drema Pry, MD File Time 04/05/2020  8:11 PM  Author Type Physician Status Signed  Last Editor Horton Chin, MD Service Physical Medicine and Rehabilitation  Hospital Acct # [id number] Admit Date 04/07/2020

## 2020-04-07 NOTE — Progress Notes (Signed)
STROKE TEAM PROGRESS NOTE   INTERVAL HISTORY Pt sitting in bed, working with PT/OT in room. No acute event overnight. She seems more interactive today and in high spirit. Will likely go to CIR today with outpt PET and biopsy. Continue ASA only.    OBJECTIVE Vitals:   04/06/20 2007 04/06/20 2355 04/07/20 0403 04/07/20 0855  BP: (!) 149/73 118/68 128/81 129/90  Pulse: 67 66 67 63  Resp: 18 16 15 18   Temp: 97.9 F (36.6 C) 97.9 F (36.6 C) 97.7 F (36.5 C) 98.3 F (36.8 C)  TempSrc: Oral Oral Oral Oral  SpO2: 91% 92% 92% 92%  Weight:      Height:        CBC:  Recent Labs  Lab 04/03/20 1004  WBC 8.1  NEUTROABS 4.8  HGB 13.3  HCT 41.3  MCV 91.0  PLT 188   Basic Metabolic Panel:  Recent Labs  Lab 04/03/20 1004  NA 142  K 3.8  CL 107  CO2 24  GLUCOSE 101*  BUN 10  CREATININE 0.48  CALCIUM 8.9   Lipid Panel:     Component Value Date/Time   CHOL 139 04/04/2020 0321   TRIG 98 04/04/2020 0321   HDL 37 (L) 04/04/2020 0321   CHOLHDL 3.8 04/04/2020 0321   VLDL 20 04/04/2020 0321   LDLCALC 82 04/04/2020 0321   HgbA1c:  Lab Results  Component Value Date   HGBA1C 6.0 (H) 04/04/2020   Urine Drug Screen:     Component Value Date/Time   LABOPIA NONE DETECTED 04/04/2020 0847   COCAINSCRNUR NONE DETECTED 04/04/2020 0847   LABBENZ NONE DETECTED 04/04/2020 0847   AMPHETMU NONE DETECTED 04/04/2020 0847   THCU NONE DETECTED 04/04/2020 0847   LABBARB NONE DETECTED 04/04/2020 0847    Alcohol Level     Component Value Date/Time   ETH <10 04/03/2020 1004    IMAGING  DG Chest 2 View 04/03/2020 IMPRESSION:  1. Low lung volumes with streaky basilar atelectasis.  2. Additional central vascular congestion, airways thickening, and few septal lines could reflect developing interstitial edema.  3. Nodular opacity in the right mid to lower lung, recommend further evaluation with CT imaging.    CT Head Wo Contrast 04/03/2020 IMPRESSION:  1.  No acute findings.  2.  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  3. Chronic ischemic microvascular disease. Possible old left cerebellar lacunar infarct as well as small old left occipital infarct.   CT CHEST WO CONTRAST 04/04/2020 IMPRESSION:  1. 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 The left upper lobe nodule is spiculated. Malignancy is the diagnosis of exclusion. Far less likely cavitating infection or septic emboli could be considered in the appropriate setting.  2. Pathologic mediastinal and hilar adenopathy worrisome for nodal metastases.  3. Nonspecific left adrenal nodule, metastatic disease not excluded.  4. Aortic Atherosclerosis (ICD10-I70.0) and Emphysema (ICD10-J43.9).  MR Brain W and Wo Contrast MR ANGIO HEAD WO CONTRAST MR ANGIO NECK WO CONTRAST 04/03/2020 IMPRESSION:  1. Multiple areas of acute and subacute infarct in the brain bilaterally suggestive of emboli.  2. Moderate chronic microvascular ischemic change in the white matter and pons.  3. Occlusion left A2 segment and mild stenosis right A2 segment. Hypoplastic right A1 segment.  4. Mild stenosis right P2 segment. Middle cerebral arteries patent bilaterally.    ECG - SR rate 73 BPM. (See cardiology reading for complete details)   PHYSICAL EXAM  Temp:  [97.6  F (36.4 C)-98.3 F (36.8 C)] 98.3 F (36.8 C) (06/30 0855) Pulse Rate:  [63-98] 63 (06/30 0855) Resp:  [15-18] 18 (06/30 0855) BP: (118-149)/(59-90) 129/90 (06/30 0855) SpO2:  [91 %-96 %] 92 % (06/30 0855)  General - Well nourished, well developed, in no apparent distress.  Ophthalmologic - fundi not visualized due to noncooperation.  Cardiovascular - Regular rhythm and rate, frequent PVCs on tele.  Mental Status -  Level of arousal and orientation to time, place, and person were intact. Language including expression, naming, repetition, comprehension was assessed and found intact. However, mild  psychmotor slowing, but more interactive   Cranial Nerves II - XII - II - Visual field intact OU. III, IV, VI - Extraocular movements intact. V - Facial sensation intact bilaterally. VII - mild right facial droop. VIII - Hearing & vestibular intact bilaterally. X - Palate elevates symmetrically. XI - Chin turning & shoulder shrug intact bilaterally. XII - Tongue protrusion intact.  Motor Strength - The patient's strength was normal in left UE and LE, however, right UE and LE 4/5 with mild pronator drift.  Bulk was normal and fasciculations were absent.   Motor Tone - Muscle tone was assessed at the neck and appendages and was normal.  Reflexes - The patient's reflexes were symmetrical in all extremities and she had no pathological reflexes.  Sensory - Light touch, temperature/pinprick were assessed and were symmetrical.    Coordination - The patient had normal movements in the hands with no ataxia or dysmetria, although slow action, more so on the right.  Tremor was absent.  Gait and Station - deferred.   ASSESSMENT/PLAN Ms. Jessica [name] is a 59 y.o. female with history of ongoing tobacco use, cerebral aneurysms (coiled), high cholesterol and previous stroke who presents with right-sided numbness / weakness which mostly resolved and tremor of right arm and leg.  She did not receive IV t-PA due to late presentation.  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 hypercoagulable due to malignancy  CT head -  No acute findings. 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 Chronic ischemic microvascular disease. Possible old left cerebellar lacunar infarct as well as small old left occipital infarct.   MRI head w/wo - Multiple areas of acute and subacute infarct in the brain bilaterally suggestive of systemic emboli.   MRA H&N - Occlusion left A2 segment and mild stenosis right A2 segment. Hypoplastic  right A1 segment.   2D Echo EF 60-65%  LE venous doppler no DVT  May consider TEE and/or loop if malignancy work up negative.  [place] Corona Virus 2 - negative  LDL - 82  HgbA1c - 6.0  UDS - neg  Hypercoagulable and autoimmune labs neg  VTE prophylaxis - SCDs  aspirin 81 mg daily prior to admission, now on ASA 81mg . OK from neuro standpoint to continue ASA only on discharge to facilitate biopsy scheduling.   Patient counseled to be compliant with her antithrombotic medications  Ongoing aggressive stroke risk factor management  Therapy recommendations:  CIR  Disposition:  Pending  ? Malignancy   Chest CT -  Left upper lobe and right middle lobe cavitating pulmonary nodules. Pathologic mediastinal and hilar adenopathy worrisome for nodal metastases. Nonspecific left adrenal nodule, metastatic disease not excluded.  Pt denies discomfort or weight loss  OK to continue ASA only on discharge  LE venous doppler neg for DVT  MRI brain with contrast no metastasis seen  Pulmonary to schedule lung biopsy as an OP  Pulmonary recommends outpt PET  Hypertension  Home BP meds: none   Current BP meds: none   BP stable . Long-term BP goal normotensive  Hyperlipidemia  Home Lipid lowering medication: Lipitor 80 mg daily  LDL 82, goal < 70  Current lipid lowering medication: lipitor 80   Continue statin at discharge  Tobacco abuse  Current smoker  Smoking cessation counseling provided  Pt is willing to quit  Other Stroke Risk Factors  Advanced age  Hx stroke/TIA on imaging  Other Active Problems  Microscopic hematuria. Renal US neg.    Hospital day # 4  Neurology will sign off. Please call with questions. Pt will follow up with stroke clinic Dr. Pearlean Brownie at [facility] in about 4 weeks. Thanks for the consult.   Marvel Plan, MD PhD Stroke Neurology 04/07/2020 10:56 AM   To contact Stroke Continuity provider, please refer to [email or web address]. After hours, contact  General Neurology

## 2020-04-08 ENCOUNTER — Inpatient Hospital Stay (HOSPITAL_COMMUNITY): Payer: PRIVATE HEALTH INSURANCE | Admitting: Speech Pathology

## 2020-04-08 ENCOUNTER — Inpatient Hospital Stay (HOSPITAL_COMMUNITY): Payer: PRIVATE HEALTH INSURANCE | Admitting: Physical Therapy

## 2020-04-08 ENCOUNTER — Encounter (HOSPITAL_COMMUNITY): Payer: Self-pay | Admitting: Physical Medicine & Rehabilitation

## 2020-04-08 ENCOUNTER — Other Ambulatory Visit: Payer: Self-pay

## 2020-04-08 ENCOUNTER — Inpatient Hospital Stay (HOSPITAL_COMMUNITY): Payer: PRIVATE HEALTH INSURANCE | Admitting: Occupational Therapy

## 2020-04-08 DIAGNOSIS — I6349 Cerebral infarction due to embolism of other cerebral artery: Secondary | ICD-10-CM

## 2020-04-08 LAB — COMPREHENSIVE METABOLIC PANEL
ALT: 19 U/L (ref 0–44)
AST: 17 U/L (ref 15–41)
Albumin: 3.5 g/dL (ref 3.5–5.0)
Alkaline Phosphatase: 96 U/L (ref 38–126)
Anion gap: 8 (ref 5–15)
BUN: 20 mg/dL (ref 8–23)
CO2: 24 mmol/L (ref 22–32)
Calcium: 9.1 mg/dL (ref 8.9–10.3)
Chloride: 109 mmol/L (ref 98–111)
Creatinine, Ser: 0.53 mg/dL (ref 0.44–1.00)
GFR calc Af Amer: >60 mL/min (ref >60)
GFR calc non Af Amer: >60 mL/min (ref >60)
Glucose, Bld: 102 mg/dL — ABNORMAL HIGH (ref 70–99)
Potassium: 3.6 mmol/L (ref 3.5–5.1)
Sodium: 141 mmol/L (ref 135–145)
Total Bilirubin: 0.7 mg/dL (ref 0.3–1.2)
Total Protein: 6.2 g/dL — ABNORMAL LOW (ref 6.5–8.1)

## 2020-04-08 LAB — CBC WITH DIFFERENTIAL/PLATELET
Abs Immature Granulocytes: 0.05 10*3/uL (ref 0.00–0.07)
Basophils Absolute: 0.1 10*3/uL (ref 0.0–0.1)
Basophils Relative: 1 %
Eosinophils Absolute: 0.3 10*3/uL (ref 0.0–0.5)
Eosinophils Relative: 4 %
HCT: 42.1 % (ref 36.0–46.0)
Hemoglobin: 13.6 g/dL (ref 12.0–15.0)
Immature Granulocytes: 1 %
Lymphocytes Relative: 22 %
Lymphs Abs: 1.8 10*3/uL (ref 0.7–4.0)
MCH: 28.7 pg (ref 26.0–34.0)
MCHC: 32.3 g/dL (ref 30.0–36.0)
MCV: 88.8 fL (ref 80.0–100.0)
Monocytes Absolute: 0.9 10*3/uL (ref 0.1–1.0)
Monocytes Relative: 11 %
Neutro Abs: 5.1 10*3/uL (ref 1.7–7.7)
Neutrophils Relative %: 61 %
Platelets: 168 10*3/uL (ref 150–400)
RBC: 4.74 MIL/uL (ref 3.87–5.11)
RDW: 13.1 % (ref 11.5–15.5)
WBC: 8.2 10*3/uL (ref 4.0–10.5)
nRBC: 0 % (ref 0.0–0.2)

## 2020-04-08 MED ORDER — BLOOD PRESSURE CONTROL BOOK
Freq: Once | Status: AC
  Filled 2020-04-08: qty 1

## 2020-04-08 NOTE — Evaluation (Signed)
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 High cholesterol   . Stroke [facility])    Past Surgical History:  Past Surgical History:  Procedure Laterality Date  . ABDOMINAL HYSTERECTOMY      Assessment & Plan Clinical Impression: Patient is a 59 year old right-handed female history of hyperlipidemia, tobacco abuse and 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  History taken from chart review and patient.  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  Presented on 04/03/20 with right leg hemiparesis and numbness.  CT/MRI as well as MRA of the head neck showed multiple areas of acute and subacute bilateral infarcts suggestive of embolic source.  Moderate chronic microvascular ischemic change in the white matter and pons.  Occlusion left A2 segment and mild stenosis right A2 segment.  Patient did not receive TPA.  Echocardiogram with ejection fraction of 65% with grade 1 diastolic dysfunction and no regional wall motion abnormalities.  Chest CT showed a left upper lobe and right middle lobe cavitating pulmonary nodules, pathologic mediastinal and hilar adenopathy worrisome for nodal metastasis.  Admission chemistries unremarkable except BNP 144 urinalysis negative nitrite.  Initially maintained on aspirin Plavix for CVA prophylaxis.  Plan for outpatient biopsy of pulmonary nodules.  Plavix currently remains on hold with anticipation of biopsy.  Patient  transferred to CIR on 04/07/2020 .   Patient currently requires min with mobility secondary to muscle weakness and muscle joint tightness, decreased cardiorespiratoy endurance, impaired timing and sequencing, unbalanced muscle activation and decreased coordination, field cut and hemianopsia, decreased midline orientation and decreased attention to right, decreased awareness, decreased problem solving, decreased memory and delayed processing and decreased sitting balance, decreased standing balance, decreased postural control, hemiplegia and decreased balance strategies.  Prior to hospitalization, patient was independent  with mobility and lived with Significant other in a House home.  Home access is  Stairs to enter.  Patient will benefit from skilled PT intervention to maximize safe functional mobility, minimize fall risk and decrease caregiver burden for planned discharge home with intermittent assist.  Anticipate patient will benefit from follow up [facility] at discharge.  PT - End of Session Activity Tolerance: Tolerates 10 - 20 min activity with multiple rests Endurance Deficit: Yes PT Assessment Rehab Potential (ACUTE/IP ONLY): Good PT Barriers to Discharge: Inaccessible home environment PT Patient demonstrates impairments in the following area(s): Balance;Endurance;Motor;Perception;Safety;Sensory;Behavior PT Transfers Functional Problem(s): Bed Mobility;Bed to Chair;Car;Furniture;Floor PT Locomotion Functional Problem(s): Ambulation;Wheelchair Mobility;Stairs PT Plan PT Intensity: Minimum of 1-2 x/day ,45 to 90 minutes PT Frequency: 5 out of 7 days PT Duration Estimated Length of Stay: 7-10 days PT Treatment/Interventions: Ambulation/gait training;Balance/vestibular training;Cognitive remediation/compensation;Disease management/prevention;Discharge planning;Community reintegration;DME/adaptive equipment instruction;Functional mobility training;Pain management;Neuromuscular  re-education;Patient/family education;Therapeutic Exercise;Visual/perceptual remediation/compensation;UE/LE Coordination activities;Therapeutic Activities;Skin care/wound management;Splinting/orthotics;Psychosocial support;Stair training;UE/LE Strength taining/ROM;Wheelchair propulsion/positioning PT Transfers Anticipated Outcome(s): Mod I with LRAD PT Locomotion Anticipated Outcome(s): Supervision assist with LRAD ambulatory PT Recommendation Recommendations for Other Services: Therapeutic Recreation consult Therapeutic Recreation Interventions: Stress management;Outing/community reintergration;Kitchen group Follow Up Recommendations: Home health PT Patient destination: Home Equipment Recommended:  To be determined  Skilled Therapeutic Intervention Pt received supine in bed and agreeable to PT. Supine>sit transfer with min assist assist and for cues reciprocal scooting to EOB. PT instructed patient in PT Evaluation and initiated treatment intervention; see below for results. PT educated patient in [place], rehab potential, rehab goals, and discharge recommendations. PT instructed pt in DGI. See below for results. Demonstrates increased fall risk with score of 14/24. (<19 indicates increased fall risk) . Patient demonstrates increased fall risk as noted by score of  43/56 on Berg Balance Scale.  (<36= high risk for falls, close to 100%; 37-45 significant >80%; 46-51 moderate >50%; 52-55 lower >25%). Car transfer with min assist and no AD. Gait training over unlevel surface Patient returned to room and left sitting in recliner with call bell in reach and all needs met.      PT Evaluation Precautions/Restrictions   denies General   Vital SignsTherapy Vitals Temp: 98.5 F (36.9 C) Temp Source: Oral Pulse Rate: (!) 58 Resp: 16 BP: (!) 158/70 Patient Position (if appropriate): Lying Oxygen Therapy SpO2: 93 % O2 Device: Room Air Pain Pain Assessment Pain Scale: 0-10 Pain Score: 0-No pain Home  Living/Prior Functioning Home Living Living Arrangements: Spouse/significant other Available Help at Discharge: Family;Available 24 hours/day Type of Home: House Home Access: Stairs to enter Entrance Stairs-Rails: Right;Left;Can reach both Home Layout: One level Bathroom Shower/Tub: Chiropodist: Standard Bathroom Accessibility: Yes  Lives With: Significant other Prior Function Level of Independence: Independent with basic ADLs;Independent with homemaking with ambulation;Independent with transfers;Independent with gait  Able to Take Stairs?: Yes Driving: Yes Vocation: Full time employment 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 Vision/Perception  Vision - Risk analyst: Within Passenger transport manager Range of Motion: Within Functional Limits Alignment/Gaze Preference: Within Defined Limits Tracking/Visual Pursuits: Decreased smoothness of eye movement to RIGHT inferior field;Impaired - to be further tested in functional context Saccades: Within functional limits Perception Perception: Within Functional Limits Praxis Praxis: Intact   Cognition Overall Cognitive Status: Impaired/Different from baseline Arousal/Alertness: Awake/alert Orientation Level: Oriented to person;Oriented to place;Oriented to situation;Disoriented to time Focused Attention: Appears intact Sustained Attention: Appears intact Memory: Impaired Memory Impairment: Retrieval deficit;Decreased recall of new information;Decreased short term memory Decreased Short Term Memory: Verbal basic;Functional basic Awareness: Impaired Awareness Impairment: Intellectual impairment Problem Solving: Impaired Problem Solving Impairment: Verbal basic;Functional basic Reasoning: Impaired Reasoning Impairment: Functional complex Organizing: Impaired Organizing Impairment: Functional complex Safety/Judgment: Impaired Sensation Sensation Light Touch: Impaired Detail Light  Touch Impaired Details: Impaired RLE;Impaired RUE Coordination Gross Motor Movements are Fluid and Coordinated: No Fine Motor Movements are Fluid and Coordinated: No Coordination and Movement Description: mild dysmetria in the RUE/RLE Heel Shin Test: decreased speed Bil Motor  Motor Motor: Hemiplegia Motor - Skilled Clinical Observations: mild R sided hemiplegia  Mobility Bed Mobility Bed Mobility: Rolling Right;Rolling Left;Sit to Supine;Supine to Sit Rolling Right: Supervision/verbal cueing Rolling Left: Supervision/Verbal cueing Supine to Sit: Minimal Assistance - Patient > 75% Sit to Supine: Minimal Assistance - Patient > 75% Transfers Transfers: Sit to Stand;Stand Pivot Transfers Sit to Stand: Contact Guard/Touching assist Stand Pivot Transfers: Minimal Assistance - Patient > 75% Stand Pivot Transfer Details: Verbal cues for precautions/safety Transfer (Assistive device): None Locomotion  Gait Ambulation: Yes Gait Assistance: Minimal Assistance - Patient > 75% Gait Distance (Feet): 150 Feet Assistive device: None Gait Gait: Yes Gait Pattern: Impaired Gait Pattern: Narrow base of support;Lateral trunk lean to right;Decreased stride length Gait velocity: decreased Stairs / Additional Locomotion  Stairs: Yes Stairs Assistance: Minimal Assistance - Patient > 75% Stair Management Technique: One rail Right Number of Stairs: 12 Height of Stairs: 6 Wheelchair Mobility Wheelchair Mobility: Yes Wheelchair Assistance: Minimal assistance - Patient >75% Wheelchair Propulsion: Both upper extremities Wheelchair Parts Management: Needs assistance Distance: 150  Trunk/Postural Assessment  Cervical Assessment Cervical Assessment: Exceptions to [facility] (forward head) Thoracic Assessment Thoracic Assessment: Exceptions to [facility] (rounded shoulders) Lumbar Assessment Lumbar Assessment: Exceptions to [facility] Postural Control Postural Control: Deficits on evaluation Postural Limitations:  mild R lateral lean  Balance Balance Balance Assessed: Yes Standardized Balance Assessment Standardized Balance Assessment: Dynamic Gait Index;Berg Balance Test Berg Balance Test Sit to Stand: Able to stand without using hands and stabilize independently Standing Unsupported: Able to stand safely 2 minutes Sitting with Back Unsupported but Feet Supported on Floor or Stool: Able to sit safely and securely 2 minutes Stand to Sit: Sits safely with minimal use of hands Transfers: Able to transfer safely, minor use of hands Standing Unsupported with Eyes Closed: Able to stand 10 seconds safely Standing Ubsupported with Feet Together: Able to place feet together independently and stand for 1 minute with supervision From Standing, Reach Forward with Outstretched Arm: Can reach forward >12 cm safely (5") From Standing Position, Pick up Object from Floor: Able to pick up shoe safely and easily From Standing Position, Turn to Look Behind Over each Shoulder: Looks behind from both sides and weight shifts well Turn 360 Degrees: Able to turn 360 degrees safely but slowly Standing Unsupported, Alternately Place Feet on Step/Stool: Able to complete >2 steps/needs minimal assist Standing Unsupported, One Foot in Front: Loses balance while stepping or standing Standing on One Leg: Able to lift leg independently and hold equal to or more than 3 seconds Total Score: 43 Dynamic Gait Index Level Surface: Mild Impairment Change in Gait Speed: Moderate Impairment Gait with Horizontal Head Turns: Moderate Impairment Gait with Vertical Head Turns: Mild Impairment Gait and Pivot Turn: Normal Step Over Obstacle: Moderate Impairment Step Around Obstacles: Mild Impairment Steps: Moderate Impairment Total Score: 13 Static Sitting Balance Static Sitting - Level of Assistance: 6: Modified independent (Device/Increase time) Dynamic Sitting Balance Dynamic Sitting - Level of Assistance: 5: Stand by  assistance Static Standing Balance Static Standing - Balance Support: No upper extremity supported Static Standing - Level of Assistance: 5: Stand by assistance Dynamic Standing Balance Dynamic Standing - Balance Support: No upper extremity supported;During functional activity Dynamic Standing - Level of Assistance: 4: Min assist Extremity Assessment      RLE Assessment RLE Assessment: Exceptions to [facility] General Strength Comments: grossly 4/5 proximal to distal except hip flexion 4-/5; delayed activation with MMT LLE Assessment LLE Assessment: Within Functional Limits General Strength Comments: grossly 4+/5 to 5/5    Refer to Care Plan for Long Term Goals  Recommendations for other services: Therapeutic Recreation  Kitchen group, Stress management and Outing/community reintegration  Discharge Criteria: Patient will be discharged from PT if patient refuses treatment 3 consecutive times without medical reason, if treatment goals not met, if there is a change in medical status, if patient makes no progress towards goals or if patient is discharged from hospital.  The above assessment, treatment plan, treatment alternatives and goals were discussed and mutually agreed upon: by patient  Lorie Phenix 04/08/2020, 4:01 PM

## 2020-04-08 NOTE — Progress Notes (Signed)
[place] PHYSICAL MEDICINE & REHABILITATION PROGRESS NOTE   Subjective/Complaints:  Discussed rehab Pt working at [employer] as Psychologist, counselling x 32 yrs!  ROS- neg CP, SOB, N/V/D  Objective:   No results found. Recent Labs    04/08/20 0204  WBC 8.2  HGB 13.6  HCT 42.1  PLT 168   Recent Labs    04/08/20 0204  NA 141  K 3.6  CL 109  CO2 24  GLUCOSE 102*  BUN 20  CREATININE 0.53  CALCIUM 9.1    Intake/Output Summary (Last 24 hours) at 04/08/2020 0758 Last data filed at 04/07/2020 1900 Gross per 24 hour  Intake 180 ml  Output --  Net 180 ml     Physical Exam: Vital Signs Blood pressure (!) 152/71, pulse (!) 57, temperature 98.2 F (36.8 C), temperature source Oral, resp. rate 17, height 5\' 5"  (1.651 m), weight 68.4 kg, SpO2 94 %.   General: No acute distress Mood and affect are appropriate Heart: Regular rate and rhythm no rubs murmurs or extra sounds Lungs: Clear to auscultation, breathing unlabored, no rales or wheezes Abdomen: Positive bowel sounds, soft nontender to palpation, nondistended Extremities: No clubbing, cyanosis, or edema Skin: No evidence of breakdown, no evidence of rash Motor 4/5 RIght delt, bi, tri, grip, HF, KE, ADF, 5/5 Left delt bi tri grip  HF, KE ADF redcued FM Right hand Sensation to LT equal BUE     Assessment/Plan: 1. 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  Physiatrist is providing close team supervision and 24 hour management of active medical problems listed below.  Physiatrist and rehab team continue to assess barriers to discharge/monitor patient progress toward functional and medical goals  Care Tool:  Bathing              Bathing assist       Upper Body Dressing/Undressing Upper body dressing        Upper body assist      Lower Body Dressing/Undressing Lower body dressing       What is the patient wearing?: Hospital gown only, Underwear/pull up     Lower body assist Assist for lower body dressing: Moderate Assistance - Patient 50 - 74%     Toileting Toileting Toileting Activity did not occur and hygiene only): N/A (no void or bm)  Toileting assist Assist for toileting: Minimal Assistance - Patient > 75%     Transfers Chair/bed transfer  Transfers assist     Chair/bed transfer assist level: Minimal Assistance - Patient > 75%     Locomotion Ambulation   Ambulation assist              Walk 10 feet activity   Assist           Walk 50 feet activity   Assist           Walk 150 feet activity   Assist           Walk 10 feet on uneven surface  activity   Assist           Wheelchair     Assist               Wheelchair 50 feet with 2 turns activity    Assist            Wheelchair 150 feet activity     Assist  Blood pressure (!) 152/71, pulse (!) 57, temperature 98.2 F (36.8 C), temperature source Oral, resp. rate 17, height 5\' 5"  (1.651 m), weight 68.4 kg, SpO2 94 %.  Medical Problem List and Plan: 1.  Right side hemiparesis secondary to multiple bilateral anterior posterior infarcts Left frontal most symptomatic also with  RIght parietal              -patient may shower             -ELOS/Goals: 4-7 days/supervision/mod I.             CIR , PT, OT  2.  Antithrombotics: -DVT/anticoagulation: SCDs             -antiplatelet therapy: Aspirin 81 mg daily.  Plavix on hold for planned outpatient biopsy and bronchoscopy 3. Pain Management: Tylenol as needed 4. Mood: Provide emotional support             -antipsychotic agents: N/A 5. Neuropsych: This patient is capable of making decisions on her own behalf. 6. Skin/Wound Care: Routine skin checks 7. Fluids/Electrolytes/Nutrition: Routine in and outs.  CMP ordered. 8.  Left upper lobe and right middle lobe  cavitating pulmonary nodules. Plan outpatient biopsy and bronchoscopy 9.  Tobacco abuse.  Counsel 10.  Hyperlipidemia.  Lipitor 11.  Constipation.  Adjust bowel meds as necessary    LOS: 1 days A FACE TO FACE EVALUATION WAS PERFORMED  04/08/2020, 7:58 AM

## 2020-04-08 NOTE — Care Management (Signed)
Patient ID: Jessica [name], female   DOB: 1960-11-16, 59 y.o.   MRN: [id number] Met with patient to review role of CM and collaboration with SW Margreta Journey) to facilitate prep for discharge. Reviewed secondary stroke risk factors including smoking and hx of HLD. Reviewed effects of smoking on health and tips for cessation along with prescription for a statin post stroke and blood pressure control. Reviewed DAPT however plavix on hold til after OP bx and importance of follow up with MD.

## 2020-04-08 NOTE — Evaluation (Signed)
Speech Language Pathology Assessment and Plan  Patient Details  Name: Jessica [name] MRN: [id number] Date of Birth: 1961-07-27  SLP Diagnosis: Cognitive Impairments  Rehab Potential: Excellent ELOS: 7-10 days    Today's Date: 04/08/2020 SLP Individual Time: 8416-6063 SLP Individual Time Calculation (min): 36 min   Hospital Problem: Principal Problem:   Embolic cerebral infarction [facility]  Past Medical History:  Past Medical History:  Diagnosis Date  . High cholesterol   . Stroke [facility])    Past Surgical History:  Past Surgical History:  Procedure Laterality Date  . ABDOMINAL HYSTERECTOMY      Assessment / Plan / Recommendation Clinical Impression Patient  is a 59 year old right-handed female history of hyperlipidemia, tobacco abuse and 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  History taken from chart review and patient.  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  Presented on 04/03/20 with right leg hemiparesis and numbness.  CT/MRI as well as MRA of the head neck showed multiple areas of acute and subacute bilateral infarcts suggestive of embolic source.  Moderate chronic microvascular ischemic change in the white matter and pons.  Occlusion left A2 segment and mild stenosis right A2 segment.  Patient did not receive TPA.  Echocardiogram with ejection fraction of 65% with grade 1 diastolic dysfunction and no regional wall motion abnormalities.  Chest CT showed a left upper lobe and right middle lobe cavitating pulmonary nodules, pathologic mediastinal and hilar adenopathy worrisome for nodal metastasis.  Admission chemistries unremarkable except BNP 144 urinalysis negative nitrite.  Initially maintained on aspirin Plavix for CVA prophylaxis.  Plan for outpatient biopsy of pulmonary nodules.  Plavix currently remains on hold with anticipation of biopsy. Patient is tolerating a regular diet.  Therapy evaluations completed and patient was  admitted for a comprehensive rehab program 04/07/20.  Patient was administered the Cognistat and scored mild impairments in orientation, calculations, reasoning and judgement. Patient also scored severe impairments in short-term memory and visual-construction tasks. Informally, patient demonstrated moderate-severe deficits in intellectual awareness of deficits, recall of functional information and required extra time for processing. Patient would benefit from skilled SLP intervention to maximize her cognitive functioning and overall functional independence prior to discharge.    Skilled Therapeutic Interventions          Administered a cognitive-linguistic evaluation, please see above for details.   SLP Assessment  Patient will need skilled [facility] [place] [facility] during CIR admission    Recommendations  Oral Care Recommendations: Oral care BID Recommendations for Other Services: Neuropsych consult Patient destination: Home Follow up Recommendations: Home Health SLP;24 hour supervision/assistance Equipment Recommended: None recommended by SLP    SLP Frequency 3 to 5 out of 7 days   SLP Duration  SLP Intensity  SLP Treatment/Interventions 7-10 days  Minumum of 1-2 x/day, 30 to 90 minutes  Cognitive remediation/compensation;Internal/external aids;Therapeutic Activities;Environmental controls;Cueing hierarchy;Functional tasks;Patient/family education    Pain Pain Assessment Pain Scale: 0-10 Pain Score: 0-No pain  Prior Functioning Type of Home: House  Lives With: Significant other Available Help at Discharge: Family;Available 24 hours/day Vocation: Full time employment  SLP Evaluation Cognition Overall Cognitive Status: Impaired/Different from baseline Arousal/Alertness: Awake/alert Orientation Level: Oriented to person;Oriented to place;Oriented to situation;Disoriented to time Focused Attention: Appears intact Sustained Attention: Appears intact Memory:  Impaired Memory Impairment: Retrieval deficit;Decreased recall of new information;Decreased short term memory Decreased Short Term Memory: Verbal basic;Functional basic Awareness: Impaired Awareness Impairment: Intellectual impairment Problem Solving: Impaired Problem Solving Impairment: Verbal basic;Functional  basic Reasoning: Impaired Reasoning Impairment: Functional complex Organizing: Impaired Organizing Impairment: Functional complex Safety/Judgment: Impaired  Comprehension Auditory Comprehension Overall Auditory Comprehension: Appears within functional limits for tasks assessed Visual Recognition/Discrimination Discrimination: Within Function Limits Reading Comprehension Reading Status: Not tested Expression Expression Primary Mode of Expression: Verbal Verbal Expression Overall Verbal Expression: Appears within functional limits for tasks assessed Written Expression Dominant Hand: Right Written Expression: Not tested Oral Motor Oral Motor/Sensory Function Overall Oral Motor/Sensory Function: Within functional limits Motor Speech Overall Motor Speech: Appears within functional limits for tasks assessed  Short Term Goals: Week 1: SLP Short Term Goal 1 (Week 1): STGs=LTGs due to ELOS  Refer to Care Plan for Long Term Goals  Recommendations for other services: Neuropsych  Discharge Criteria: Patient will be discharged from SLP if patient refuses treatment 3 consecutive times without medical reason, if treatment goals not met, if there is a change in medical status, if patient makes no progress towards goals or if patient is discharged from hospital.  The above assessment, treatment plan, treatment alternatives and goals were discussed and mutually agreed upon: by patient  Jessica [name] 04/08/2020, 3:37 PM

## 2020-04-08 NOTE — Evaluation (Signed)
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 High cholesterol   . Stroke [facility])    Past Surgical History:  Past Surgical History:  Procedure Laterality Date  . ABDOMINAL HYSTERECTOMY      Assessment & Plan Clinical Impression: Patient is a 59 y.o. year old female with history of hyperlipidemia, tobacco abuse and 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  History taken from chart review and patient.  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  Presented on 04/03/20 with right leg hemiparesis and numbness.  CT/MRI as well as MRA of the head neck showed multiple areas of acute and subacute bilateral infarcts suggestive of embolic source.  Moderate chronic microvascular ischemic change in the white matter and pons.  Occlusion left A2 segment and mild stenosis right A2 segment.  Patient did not receive TPA.  Echocardiogram with ejection fraction of 65% with grade 1 diastolic dysfunction and no regional wall motion abnormalities.  Chest CT showed a left upper lobe and right middle lobe cavitating pulmonary nodules, pathologic mediastinal and hilar adenopathy worrisome for nodal metastasis.  Admission chemistries unremarkable except BNP 144 urinalysis negative nitrite.  Initially maintained on aspirin Plavix for CVA prophylaxis.  Plan for outpatient biopsy of pulmonary nodules.  Plavix currently remains on hold with  anticipation of biopsy.  Patient is tolerating a regular diet.  Therapy evaluations completed and patient was admitted for a comprehensive rehab program.   Patient transferred to CIR on 04/07/2020 .    Patient currently requires min with basic self-care skills and IADL secondary to muscle weakness, decreased cardiorespiratoy endurance, decreased coordination and decreased motor planning, field cut, decreased midline orientation and decreased attention to right, decreased awareness, decreased problem solving, decreased safety awareness, decreased memory and delayed processing and decreased sitting balance, decreased standing balance, decreased postural control, hemiplegia and decreased balance strategies.  Prior to hospitalization, patient could complete ADLs and IADLs with modified independent .  Patient will benefit from skilled intervention to decrease level of assist with basic self-care skills and increase independence with basic self-care skills prior to discharge home with care partner.  Anticipate patient will require intermittent supervision and follow up outpatient.  OT - End of Session Activity Tolerance: Decreased this session Endurance Deficit: Yes Endurance Deficit Description: multiple rest breaks needed with self care tasks OT Assessment Rehab Potential (ACUTE ONLY): Good OT Barriers to Discharge: Other (comments) OT Barriers to Discharge Comments: none known at this time OT Patient demonstrates impairments in the following area(s): Balance;Cognition;Endurance;Motor;Pain;Safety OT Basic ADL's Functional Problem(s): Grooming;Bathing;Dressing;Toileting OT Transfers Functional Problem(s): Toilet;Tub/Shower OT Additional Impairment(s): Fuctional Use of Upper Extremity OT Plan OT Intensity: Minimum of 1-2 x/day, 45 to 90 minutes OT Frequency: 5 out of 7 days OT Duration/Estimated Length of Stay: 7-10 days OT Treatment/Interventions: Balance/vestibular training;Functional electrical  stimulation;Self Care/advanced ADL retraining;UE/LE Coordination activities;Cognitive remediation/compensation;Functional mobility training;Visual/perceptual remediation/compensation;Community reintegration;Neuromuscular re-education;Discharge planning;Pain management;Therapeutic Activities;Therapeutic Exercise;Patient/family education;DME/adaptive equipment instruction;Psychosocial support;UE/LE Strength taining/ROM OT Self Feeding Anticipated Outcome(s): supervision OT Basic Self-Care Anticipated  Outcome(s): mod I -S OT Toileting Anticipated Outcome(s): S OT Bathroom Transfers Anticipated Outcome(s): S OT Recommendation Recommendations for Other Services: Neuropsych consult Patient destination: Home Follow Up Recommendations: Outpatient OT;24 hour supervision/assistance Equipment Recommended: To be determined   Skilled Therapeutic Intervention Upon entering the room, pt supine in bed and agreeable to OT intervention. OT educating pt on OT purpose POC and goal with pt verbalizing understanding and agreement. Pt performed bed mobility with supervision. Pt ambulating without use of AD at min A level into bathroom and onto TTB for bathing at shower level. Pt bathing while seated and standing to wash buttocks and peri area. Pt able to utilize R UE to grasp cloth and wash parts. Pt exiting the bathroom and seated on EOB for dressing tasks with focus on hemiplegic dressing technique and min A overall for balance. Pt standing at sink for grooming tasks with increased time for problem solving and coordinating tasks. Pt seated in recliner chair with chair alarm activated and all needs within reach.   OT Evaluation Precautions/Restrictions  Precautions Precautions: Fall Vital Signs Therapy Vitals Temp: 98.5 F (36.9 C) Temp Source: Oral Pulse Rate: (!) 58 Resp: 16 BP: (!) 158/70 Patient Position (if appropriate): Lying Oxygen Therapy SpO2: 93 % O2 Device: Room Air Pain Pain Assessment Pain  Scale: 0-10 Pain Score: 0-No pain Home Living/Prior Functioning Home Living Family/patient expects to be discharged to:: Private residence Living Arrangements: Spouse/significant other Available Help at Discharge: Family, Available 24 hours/day Type of Home: House Home Access: Stairs to enter Entrance Stairs-Rails: None Home Layout: One level Bathroom Shower/Tub: Chiropodist: Standard Bathroom Accessibility: Yes  Lives With: Significant other Prior Function Level of Independence: Independent with basic ADLs, Independent with homemaking with ambulation, Independent with transfers, Independent with gait  Able to Take Stairs?: Yes Driving: Yes Vocation: Full time employment 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 Vision Baseline Vision/History: Wears glasses Wears Glasses: Reading only Patient Visual Report: No change from baseline Vision Assessment?: Yes Visual Fields: Right visual field deficit Cognition Overall Cognitive Status: Impaired/Different from baseline Arousal/Alertness: Awake/alert Orientation Level: Person;Place;Situation Person: Oriented Place: Oriented Situation: Oriented Year: 2021 Month: July Day of Week: Correct Memory: Impaired Memory Impairment: Retrieval deficit;Decreased recall of new information;Decreased short term memory Decreased Short Term Memory: Verbal basic;Functional basic Immediate Memory Recall: Sock;Blue;Bed Memory Recall Sock: Without Cue Memory Recall Blue: With Cue Memory Recall Bed: Not able to recall Focused Attention: Appears intact Sustained Attention: Appears intact Awareness: Impaired Awareness Impairment: Intellectual impairment Problem Solving: Impaired Problem Solving Impairment: Verbal basic;Functional basic Reasoning: Impaired Reasoning Impairment: Functional complex Organizing: Impaired Organizing Impairment: Functional complex Safety/Judgment:  Impaired Sensation Sensation Light Touch: Impaired Detail Light Touch Impaired Details: Impaired RLE;Impaired RUE Coordination Gross Motor Movements are Fluid and Coordinated: No Fine Motor Movements are Fluid and Coordinated: No Coordination and Movement Description: mild dysmetria in the RUE/RLE Heel Shin Test: decreased speed Bil Motor  Motor Motor: Hemiplegia Motor - Skilled Clinical Observations: mild R sided hemiplegia Mobility  Bed Mobility Bed Mobility: Rolling Right;Rolling Left;Sit to Supine;Supine to Sit Rolling Right: Supervision/verbal cueing Rolling Left: Supervision/Verbal cueing Supine to Sit: Minimal Assistance - Patient > 75% Sit to Supine: Minimal Assistance - Patient > 75% Transfers Sit to Stand: Minimal Assistance - Patient > 75%  Trunk/Postural Assessment  Cervical Assessment Cervical Assessment: Exceptions to [facility] (forward head) Thoracic Assessment Thoracic Assessment: Exceptions to [facility] (rounded shoulders) Lumbar Assessment Lumbar Assessment: Exceptions to [facility] Postural Control Postural Control: Deficits on evaluation  Postural Limitations: mild R lateral lean  Balance Balance Balance Assessed: Yes Standardized Balance Assessment Standardized Balance Assessment: Dynamic Gait Index;Berg Balance Test Berg Balance Test Sit to Stand: Able to stand without using hands and stabilize independently Standing Unsupported: Able to stand safely 2 minutes Sitting with Back Unsupported but Feet Supported on Floor or Stool: Able to sit safely and securely 2 minutes Stand to Sit: Sits safely with minimal use of hands Transfers: Able to transfer safely, minor use of hands Standing Unsupported with Eyes Closed: Able to stand 10 seconds safely Standing Ubsupported with Feet Together: Able to place feet together independently and stand for 1 minute with supervision From Standing, Reach Forward with Outstretched Arm: Can reach forward >12 cm safely (5") From Standing  Position, Pick up Object from Floor: Able to pick up shoe safely and easily From Standing Position, Turn to Look Behind Over each Shoulder: Looks behind from both sides and weight shifts well Turn 360 Degrees: Able to turn 360 degrees safely but slowly Standing Unsupported, Alternately Place Feet on Step/Stool: Able to complete >2 steps/needs minimal assist Standing Unsupported, One Foot in Front: Loses balance while stepping or standing Standing on One Leg: Able to lift leg independently and hold equal to or more than 3 seconds Total Score: 43 Dynamic Gait Index Level Surface: Mild Impairment Change in Gait Speed: Moderate Impairment Gait with Horizontal Head Turns: Moderate Impairment Gait with Vertical Head Turns: Mild Impairment Gait and Pivot Turn: Normal Step Over Obstacle: Moderate Impairment Step Around Obstacles: Mild Impairment Steps: Moderate Impairment Total Score: 13 Static Sitting Balance Static Sitting - Level of Assistance: 6: Modified independent (Device/Increase time) Dynamic Sitting Balance Dynamic Sitting - Level of Assistance: 5: Stand by assistance Static Standing Balance Static Standing - Balance Support: No upper extremity supported Static Standing - Level of Assistance: 5: Stand by assistance Dynamic Standing Balance Dynamic Standing - Balance Support: No upper extremity supported;During functional activity Dynamic Standing - Level of Assistance: 4: Min assist Extremity/Trunk Assessment RUE Assessment RUE Assessment: Exceptions to [facility] Passive Range of Motion (PROM) Comments: WFLs Active Range of Motion (AROM) Comments: WFLs General Strength Comments: 3-/5 LUE Assessment LUE Assessment: Within Functional Limits     Refer to Care Plan for Long Term Goals  Recommendations for other services: Neuropsych   Discharge Criteria: Patient will be discharged from OT if patient refuses treatment 3 consecutive times without medical reason, if treatment goals not  met, if there is a change in medical status, if patient makes no progress towards goals or if patient is discharged from hospital.  The above assessment, treatment plan, treatment alternatives and goals were discussed and mutually agreed upon: by patient  Gypsy Decant 04/08/2020, 4:55 PM

## 2020-04-08 NOTE — Progress Notes (Signed)
Inpatient Rehabilitation  Patient information reviewed and entered into eRehab system by Diedre Maclellan M. Neng Albee, M.A., CCC/SLP, PPS Coordinator.  Information including medical coding, functional ability and quality indicators will be reviewed and updated through discharge.    

## 2020-04-08 NOTE — Progress Notes (Signed)
Responded to referral consult  to assist with A D.  Upon arrival patient said she did not request AD.  I explain the document and left it with patient to take home to review at later time.  Chaplain available as needed.  Venida Jarvis, [place], [facility], Pager [phone] .

## 2020-04-08 NOTE — Progress Notes (Signed)
Inpatient Rehabilitation Center Individual Statement of Services  Patient Name:  Jessica [name]  Date:  04/08/2020  Welcome to the Inpatient Rehabilitation Center.  Our goal is to provide you with an individualized program based on your diagnosis and situation, designed to meet your specific needs.  With this comprehensive rehabilitation program, you will be expected to participate in at least 3 hours of rehabilitation therapies Monday-Friday, with modified therapy programming on the weekends.  Your rehabilitation program will include the following services:  Physical Therapy (PT), Occupational Therapy (OT), Speech Therapy (ST), 24 hour per day rehabilitation nursing, Therapeutic Recreaction (TR), Neuropsychology, Care Coordinator, Rehabilitation Medicine, Nutrition Services, Pharmacy Services and Other  Weekly team conferences will be held on Wednesdays to discuss your progress.  Your Inpatient Rehabilitation Care Coordinator will talk with you frequently to get your input and to update you on team discussions.  Team conferences with you and your family in attendance may also be held.  Expected length of stay: 4-7 days  Overall anticipated outcome: Supervision/MOD I  Depending on your progress and recovery, your program may change. Your Inpatient Rehabilitation Care Coordinator will coordinate services and will keep you informed of any changes. Your Inpatient Rehabilitation Care Coordinator's name and contact numbers are listed  below.  The following services may also be recommended but are not provided by the Inpatient Rehabilitation Center:   Home Health Rehabiltiation Services  Outpatient Rehabilitation Services    Arrangements will be made to provide these services after discharge if needed.  Arrangements include referral to agencies that provide these services.  Your insurance has been verified to be:  [employer]  Your primary doctor is:  NO PCP  Pertinent information will be shared with  your doctor and your insurance company.  Inpatient Rehabilitation Care Coordinator:  Lavera Guise, [place] [phone] or [phone]  Information discussed with and copy given to patient by: Andria Rhein, 04/08/2020, 11:45 AM

## 2020-04-08 NOTE — Progress Notes (Signed)
Inpatient Rehabilitation Care Coordinator Assessment and Plan  Patient Details  Name: Jessica [name] MRN: [id number] Date of Birth: 18-Apr-1961  Today's Date: 04/08/2020  Problem List:  Patient Active Problem List   Diagnosis Date Noted  . Embolic cerebral infarction (HCC) 04/07/2020  . Cerebrovascular accident (CVA) due to bilateral embolism of middle cerebral arteries (HCC)   . Right hemiparesis (HCC)   . Slow transit constipation   . Tobacco abuse   . Pulmonary nodules   . Right sided weakness 04/03/2020  . TIA (transient ischemic attack) 04/03/2020   Past Medical History:  Past Medical History:  Diagnosis Date  . High cholesterol   . Stroke [facility])    Past Surgical History:  Past Surgical History:  Procedure Laterality Date  . ABDOMINAL HYSTERECTOMY     Social History:  reports that she has been smoking cigarettes. She has never used smokeless tobacco. She reports that she does not drink alcohol and does not use drugs.  Family / Support Systems Patient Roles: Spouse Spouse/Significant Other: Armond Children: 1 Daughter Anticipated Caregiver: Spouse Ability/Limitations of Caregiver: none Caregiver Availability: 24/7  Social History Preferred language: English Religion:  Cultural Background: Activities Director Read: Yes Write: Yes Employment Status: Employed Name of Employer: [employer]   Abuse/Neglect Abuse/Neglect Assessment Can Be Completed: Yes Physical Abuse: Denies Verbal Abuse: Denies Sexual Abuse: Denies Exploitation of patient/patient's resources: Denies Self-Neglect: Denies  Emotional Status Pt's affect, behavior and adjustment status: Patient reports feeling more sad Recent Psychosocial Issues: no Psychiatric History: no Substance Abuse History: no  Patient / Family Perceptions, Expectations & Goals Pt/Family understanding of illness & functional limitations: yes Pt/family expectations/goals: Goal to discharge back home  [employer]: None Premorbid Home Care/DME Agencies: None Transportation available at discharge: spouse able to transport Resource referrals recommended: Neuropsychology  Discharge Planning Living Arrangements: Spouse/significant other Support Systems: Spouse/significant other Type of Residence: Private residence (1 Level home: 3 steps to enter front foor with railings) Insurance Resources: Media planner (specify) Teacher, music) Does the patient have any problems obtaining your medications?: No Care Coordinator Anticipated Follow Up Needs: HH/OP Expected length of stay: 4-7 Days  Clinical Impression SW entered room, introduced self and explain role. SW will continue to follow up with questions and concerns  Andria Rhein 04/08/2020, 12:59 PM

## 2020-04-09 ENCOUNTER — Inpatient Hospital Stay (HOSPITAL_COMMUNITY): Payer: PRIVATE HEALTH INSURANCE | Admitting: Physical Therapy

## 2020-04-09 ENCOUNTER — Inpatient Hospital Stay (HOSPITAL_COMMUNITY): Payer: PRIVATE HEALTH INSURANCE | Admitting: Speech Pathology

## 2020-04-09 ENCOUNTER — Inpatient Hospital Stay (HOSPITAL_COMMUNITY): Payer: PRIVATE HEALTH INSURANCE | Admitting: Occupational Therapy

## 2020-04-09 NOTE — Progress Notes (Signed)
Physical Therapy Session Note  Patient Details  Name: Jessica [name] MRN: [id number] Date of Birth: 1961/05/25  Today's Date: 04/09/2020 PT Individual Time: 0815-0915 PT Individual Time Calculation (min): 60 min   Short Term Goals: Week 1:  PT Short Term Goal 1 (Week 1): STG=LTG due ELOS  Skilled Therapeutic Interventions/Progress Updates:   Pt received supine in bed and agreeable to PT. Supine>sit transfer with supervision assist on the r side of the bed with cues for awareness of RUE positioning. Pt requesting to use restroom. Ambulatory transfer to toilet for continent urination with RW and supervision assist. Pt able to manage hygiene and clothing.   Gait training in hall with RW x 147f and supervision assist with cues for improved visional scanning to the R to prevent hitting obstacles with RW. Gait training also performed without AD x [id number] supervision assist. Pt noted to have improved step length and cadence compared to previous sessions.   Dynavision dynamic balance and visual scanning to perform program A x 3, 4 rings. 22, 27, 28 respectively. Supervision assist from PT for balance and min cues for visual scanning to the r initially, but pt able to carry over to all 3 sessions with decreasing instruction. Additional dynamic balance training to solve block puzzle while standing on red wedge. Completed x 2 with min cues for processing, problem solving, and anterior weight shift to prevent posterior LOB initially   Sit<>stand transfers performed with supervision assist throughout session to and from various surfaces. Pt noted to have mild posterior lean intermittently, requiring multiple attempts to power into standing.   Pt returned to room and performed ambulatory transfer to bed with no AD and supervision assist. Sit>supine completed with supervision assist for safety and cues for awareness of RUE/RLE and left supine in bed with call bell in reach and all needs met.           Therapy Documentation Precautions:  Precautions Precautions: Fall Restrictions Weight Bearing Restrictions: No Pain: denies   Therapy/Group: Individual Therapy  AustLorie Phenix/2021, 9:18 AM

## 2020-04-09 NOTE — IPOC Note (Signed)
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[facility])  Hospital Problems: Principal Problem:   Embolic cerebral infarction [facility])     Functional Problem List: Nursing Safety, Endurance  PT Balance, Endurance, Motor, Perception, Safety, Sensory, Behavior  OT Balance, Cognition, Endurance, Motor, Pain, Safety  SLP Cognition  TR         Basic ADLs: OT Grooming, Bathing, Dressing, Toileting     Advanced  ADLs: OT       Transfers: PT Bed Mobility, Bed to Chair, Car, [employer], Civil Service fast streamer, Research scientist (life sciences): PT Ambulation, Psychologist, prison and probation services, Stairs     Additional Impairments: OT Fuctional Use of Upper Extremity  SLP Social Cognition   Problem Solving, Social Interaction, Memory, Awareness  TR      Anticipated Outcomes Item Anticipated Outcome  Self Feeding supervision  Swallowing      Basic self-care  mod I -S  Toileting  S   Bathroom Transfers S  Bowel/Bladder  To remain continent x 2 with LBM 04/04/20  Transfers  Mod I with LRAD  Locomotion  Supervision assist with LRAD ambulatory  Communication     Cognition  Mod A  Pain  less than 2 out of 10  Safety/Judgment  To remain fall free wihile in rehab   Therapy Plan: PT Intensity: Minimum of 1-2 x/day ,45 to 90 minutes PT Frequency: 5 out of 7 days PT Duration Estimated Length of Stay: 7-10 days OT Intensity: Minimum of 1-2 x/day, 45 to 90 minutes OT Frequency: 5 out of 7 days OT Duration/Estimated Length of Stay: 7-10 days SLP Intensity: Minumum of 1-2 x/day, 30 to 90 minutes SLP Frequency: 3 to 5 out of 7 days SLP Duration/Estimated Length of Stay: 7-10 days   Due to the current state of emergency, patients may not be receiving their 3-hours of Medicare-mandated therapy.   Team Interventions: Nursing Interventions Patient/Family Education, Disease Management/Prevention, Discharge  Planning  PT interventions Ambulation/gait training, Balance/vestibular training, Cognitive remediation/compensation, Disease management/prevention, Discharge planning, Community reintegration, DME/adaptive equipment instruction, Functional mobility training, Pain management, Neuromuscular re-education, Patient/family education, Therapeutic Exercise, Visual/perceptual remediation/compensation, UE/LE Coordination activities, Therapeutic Activities, Skin care/wound management, Splinting/orthotics, Psychosocial support, Stair training, UE/LE Strength taining/ROM, Wheelchair propulsion/positioning  OT Interventions Balance/vestibular training, Functional electrical stimulation, Self Care/advanced ADL retraining, UE/LE Coordination activities, Cognitive remediation/compensation, Functional mobility training, Visual/perceptual remediation/compensation, Community reintegration, Neuromuscular re-education, Discharge planning, Pain management, Therapeutic Activities, Therapeutic Exercise, Patient/family education, DME/adaptive equipment instruction, Psychosocial support, UE/LE Strength taining/ROM  SLP Interventions Cognitive remediation/compensation, Internal/external aids, Therapeutic Activities, Environmental controls, Cueing hierarchy, Functional tasks, Patient/family education  TR Interventions    SW/CM Interventions Discharge Planning, Psychosocial Support, Patient/Family Education   Barriers to Discharge MD  Medical stability  Nursing      PT Inaccessible home environment    OT Other (comments) none known at this time  SLP      SW       Team Discharge Planning: Destination: PT-Home ,OT- Home , SLP-Home Projected Follow-up: PT-Home health PT, OT-  Outpatient OT, 24 hour supervision/assistance, SLP-Home Health SLP, 24 hour supervision/assistance Projected Equipment Needs: PT-To be determined, OT- To be determined, SLP-None recommended by SLP Equipment Details: PT- , OT-  Patient/family involved  in discharge planning: PT- Patient,  OT-Patient, SLP-Patient  MD ELOS: 7-10d Medical Rehab Prognosis:  Good Assessment:  59 year old right-handed female history of hyperlipidemia, tobacco abuse and 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 History taken from  chart review and patient. 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 Presented on 04/03/20 with right leg hemiparesis and numbness. CT/MRI as well as MRA of the head neck showed multiple areas of acute and subacute bilateral infarcts suggestive of embolic source. Moderate chronic microvascular ischemic change in the white matter and pons. Occlusion left A2 segment and mild stenosis right A2 segment. Patient did not receive TPA. Echocardiogram with ejection fraction of 65% with grade 1 diastolic dysfunction and no regional wall motion abnormalities. Chest CT showed a left upper lobe and right middle lobe cavitating pulmonary nodules, pathologic mediastinal and hilar adenopathy worrisome for nodal metastasis. Admission chemistries unremarkable except BNP 144 urinalysis negative nitrite. Initially maintained on aspirin Plavix for CVA prophylaxis. Plan for outpatient biopsy of pulmonary nodules. Plavix currently remains on hold with anticipation of biopsy.    Now requiring 24/7 Rehab RN,MD, as well as CIR level PT, OT and SLP.  Treatment team will focus on ADLs and mobility with goals set at supervision See Team Conference Notes for weekly updates to the plan of care

## 2020-04-09 NOTE — Progress Notes (Signed)
[place] PHYSICAL MEDICINE & REHABILITATION PROGRESS NOTE   Subjective/Complaints:  Slept well, had no issues overnight or in therapy.  ROS- neg CP, SOB, N/V/D  Objective:   No results found. Recent Labs    04/08/20 0204  WBC 8.2  HGB 13.6  HCT 42.1  PLT 168   Recent Labs    04/08/20 0204  NA 141  K 3.6  CL 109  CO2 24  GLUCOSE 102*  BUN 20  CREATININE 0.53  CALCIUM 9.1    Intake/Output Summary (Last 24 hours) at 04/09/2020 0737 Last data filed at 04/08/2020 2209 Gross per 24 hour  Intake 600 ml  Output --  Net 600 ml     Physical Exam: Vital Signs Blood pressure 125/66, pulse (!) 54, temperature 98 F (36.7 C), resp. rate 16, height 5\' 5"  (1.651 m), weight 68.4 kg, SpO2 91 %.  General: No acute distress Mood and affect are appropriate Heart: Regular rate and rhythm no rubs murmurs or extra sounds Lungs: Clear to auscultation, breathing unlabored, no rales or wheezes Abdomen: Positive bowel sounds, soft nontender to palpation, nondistended Extremities: No clubbing, cyanosis, or edema Skin: No evidence of breakdown, no evidence of rash  Motor 4/5 RIght delt, bi, tri, grip, HF, KE, ADF, 5/5 Left delt bi tri grip  HF, KE ADF redcued FM Right hand Sensation to LT equal BUE   Assessment/Plan: 1. 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  Physiatrist is providing close team supervision and 24 hour management of active medical problems listed below.  Physiatrist and rehab team continue to assess barriers to discharge/monitor patient progress toward functional and medical goals  Care Tool:  Bathing    Body parts bathed by patient: Right arm, Left arm, Chest, Abdomen, Front perineal area, Buttocks, Right upper leg, Left upper leg, Right lower leg, Left lower leg, Face         Bathing assist Assist Level: Minimal Assistance - Patient >  75%     Upper Body Dressing/Undressing Upper body dressing   What is the patient wearing?: Dress    Upper body assist Assist Level: Minimal Assistance - Patient > 75%    Lower Body Dressing/Undressing Lower body dressing      What is the patient wearing?: Underwear/pull up     Lower body assist Assist for lower body dressing: Minimal Assistance - Patient > 75%     Toileting Toileting Toileting Activity did not occur (Clothing management and hygiene only): N/A (no void or bm)  Toileting assist Assist for toileting: Minimal Assistance - Patient > 75%     Transfers Chair/bed transfer  Transfers assist     Chair/bed transfer assist level: Minimal Assistance - Patient > 75%     Locomotion Ambulation   Ambulation assist      Assist level: Minimal Assistance - Patient > 75% Assistive device: Hand held assist Max distance: 150   Walk 10 feet activity   Assist     Assist level: Minimal Assistance - Patient > 75% Assistive device: No Device, Hand held assist   Walk 50 feet activity   Assist    Assist level: Minimal Assistance - Patient > 75% Assistive device: Hand held assist, No Device    Walk 150 feet activity   Assist    Assist level: Minimal Assistance - Patient > 75% Assistive device: Hand held assist, No Device    Walk 10  feet on uneven surface  activity   Assist     Assist level: Minimal Assistance - Patient > 75% Assistive device: Hand held assist   Wheelchair     Assist Will patient use wheelchair at discharge?: No      Wheelchair assist level: Minimal Assistance - Patient > 75% Max wheelchair distance: 150    Wheelchair 50 feet with 2 turns activity    Assist        Assist Level: Minimal Assistance - Patient > 75%   Wheelchair 150 feet activity     Assist      Assist Level: Minimal Assistance - Patient > 75%   Blood pressure 125/66, pulse (!) 54, temperature 98 F (36.7 C), resp. rate 16, height 5'  5" (1.651 m), weight 68.4 kg, SpO2 91 %.  Medical Problem List and Plan: 1.  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             CIR , PT, OT  2.  Antithrombotics: -DVT/anticoagulation: SCDs             -antiplatelet therapy: Aspirin 81 mg daily.  Plavix on hold for planned outpatient biopsy and bronchoscopy 3. Pain Management: Tylenol as needed 4. Mood: Provide emotional support             -antipsychotic agents: N/A 5. Neuropsych: This patient is capable of making decisions on her own behalf. 6. Skin/Wound Care: Routine skin checks 7. Fluids/Electrolytes/Nutrition: Routine in and outs.  CMP ordered. 8.  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 Plan outpatient biopsy and bronchoscopy 9.  Tobacco abuse.  Counsel 10.  Hyperlipidemia.  Lipitor 11.  Constipation.  Adjust bowel meds as necessary    LOS: 2 days A FACE TO FACE EVALUATION WAS PERFORMED  Erick Colace 04/09/2020, 7:37 AM

## 2020-04-09 NOTE — Progress Notes (Signed)
Occupational Therapy Session Note  Patient Details  Name: Jessica [name] MRN: [id number] Date of Birth: 07/07/1961  Today's Date: 04/09/2020 OT Individual Time: 1256-1406 OT Individual Time Calculation (min): 70 min   Short Term Goals: Week 1:  OT Short Term Goal 1 (Week 1): STG=LTGs secondary to estimated short LOS  Skilled Therapeutic Interventions/Progress Updates:    Pt greeted in the recliner, finishing up her salad. Note that pt was using her Rt hand at the dominant level to manage her fork, needed active assist from Lt to cut up her chicken using the fork alone. Pt did not want to use the knife to assist with this aspect of eating. Afterwards, she was agreeable to shower. Pt completed bathing (sit<stand from TTB), dressing (sit<stand from recliner), toileting (using standard toilet, +bladder void), and oral care/grooming tasks (standing at the sink) during session. Tx focus was placed on Rt NMR, functional transfers, dynamic balance, and ADL retraining. All functional transfers completed with HHA on the Rt side, vcs for improving upright posture. She required min cues for sequencing during stated tasks, noted some delayed processing at times. Rt UE used at dominant/nondominant level with min cuing, increased time required to meet task demands due to slower movement. CGA for dynamic standing balance during shower and LB dressing tasks. At end of session pt remained sitting in her recliner with all needs within reach and chair alarm set.   Therapy Documentation Precautions:  Precautions Precautions: Fall Restrictions Weight Bearing Restrictions: No Vital Signs: Therapy Vitals Temp: 98.3 F (36.8 C) Pulse Rate: 66 Resp: 16 BP: (!) 129/91 Patient Position (if appropriate): Sitting Oxygen Therapy SpO2: 95 % O2 Device: Room Air Pain: pt denied having pain during tx   ADL:       Therapy/Group: Individual Therapy  Zaccheus Edmister A Elen Acero 04/09/2020, 3:41 PM

## 2020-04-09 NOTE — Progress Notes (Signed)
Speech Language Pathology Daily Session Note  Patient Details  Name: Jessica [name] MRN: [id number] Date of Birth: 10-02-1961  Today's Date: 04/09/2020 SLP Individual Time: 0935-1030 SLP Individual Time Calculation (min): 55 min  Short Term Goals: Week 1: SLP Short Term Goal 1 (Week 1): STGs=LTGs due to ELOS  Skilled Therapeutic Interventions:  Pt was seen for skilled ST targeting goals for cognition.  Pt was received in bed awake, alert, and agreeable to participating in therapy. Pt requested to go to the bathroom when asked and benefited from min cues for safety with ambulation to and from the bathroom.  Pt was seated in recliner with chair pad alarm set for the remainder of treatment session.  SLP facilitated the session with functional money management tasks to address goals for problem solving, memory, and awareness.  Pt counted money, generated targeted values with coins and paper money, and calculated change with supervision cues.  Pt needed up to mod assist for functional problem solving and error awareness when completing functional math calculations from word problems.  Pt reports that she feels her thinking is "slow" but that her performance on targeted activities today was as expected in comparison to her baseline.  Pt was left in recliner with chair alarm set and call bell within reach.  Continue per current plan of care.    Pain Pain Assessment Pain Scale: 0-10 Pain Score: 0-No pain  Therapy/Group: Individual Therapy  Jessica [name], Melanee Spry 04/09/2020, 10:29 AM

## 2020-04-10 ENCOUNTER — Inpatient Hospital Stay (HOSPITAL_COMMUNITY): Payer: PRIVATE HEALTH INSURANCE | Admitting: Physical Therapy

## 2020-04-10 ENCOUNTER — Inpatient Hospital Stay (HOSPITAL_COMMUNITY): Payer: PRIVATE HEALTH INSURANCE | Admitting: Speech Pathology

## 2020-04-10 ENCOUNTER — Inpatient Hospital Stay (HOSPITAL_COMMUNITY): Payer: PRIVATE HEALTH INSURANCE | Admitting: Occupational Therapy

## 2020-04-10 NOTE — Progress Notes (Signed)
Occupational Therapy Session Note  Patient Details  Name: Jessica [name] MRN: [id number] Date of Birth: 11-Nov-1960  Today's Date: 04/10/2020 OT Individual Time: 3382-5053 and 1345-1444 OT Individual Time Calculation (min): 57 min 59 min  Short Term Goals: Week 1:  OT Short Term Goal 1 (Week 1): STG=LTGs secondary to estimated short LOS  Skilled Therapeutic Interventions/Progress Updates:    Pt greeted in bed with no c/o pain, requesting to start session by using the restroom. She first donned gripper socks while semi reclined in bed with min cuing. Supine<sit from flat bed without bedrail completed with setup assistance. HHA ambulatory transfer on the Rt side completed to toilet. Pt with continent bladder void, completed clothing mgt and hygiene tasks with Min A overall. Handwashing and oral care completed while standing at the sink afterwards with supervision for standing balance. Transitioned to IADL task of bedmaking, focusing on dynamic balance without AD and bimanual hand use. CGA-supervision for balance overall while sidestepping and ambulating around elevated bed, vcs for increasing functional use of Rt to meet fine motor and reaching demands of task vs overcompensate with the Lt UE. She then doffed her dress with CGA before putting on a hospital gown. She also doffed both gripper socks with supervision for balance while using figure 4 position. Pt transitioned to flat bed without bedrail once again with setup assistance, able to reach down for blankets herself. At end of session pt remained in bed with all needs within reach and bed alarm set.    2nd Session 1:1 tx (59 min) Pt greeted in bed with NT present. Per NT, pt had just transferred herself to the bathroom. Reeducated pt on importance of notifying staff for toileting needs, pt verbalizing understanding with questionable carryover. She was agreeable to go outside for tx. Pt was escorted to the outdoor patio via w/c. Worked on dynamic  standing balance by ambulating with RW and close supervision assistance, vcs for forward gaze and upright posture. She transferred to 2 community benches with supervision. While seated, set pt up with handwriting and tracing activities to work on [facility] of the Rt hand. Pt very frustrated that her signature is different post CVA, motivated to continue with practice so OT left sheets in her room after with a writing implement to do this. She rated perceived exertion of writing tasks as 7/10 in difficultly. Pt was returned to room via w/c and completed an ambulatory transfer to the bed with supervision assist using RW. Left her with all needs within reach and bed alarm set.    Therapy Documentation Precautions:  Precautions Precautions: Fall Restrictions Weight Bearing Restrictions: Yes ADL:     Therapy/Group: Individual Therapy  Searra Carnathan A Barb Shear 04/10/2020, 12:23 PM

## 2020-04-10 NOTE — Progress Notes (Signed)
[place] PHYSICAL MEDICINE & REHABILITATION PROGRESS NOTE   Subjective/Complaints: Per RN has skin area between breasts on RIght side Pt had noticed this a few weeks ago but did not see MD  Pt states that it is tender but not painful in general no drainage  No fevers   ROS- neg CP, SOB, N/V/D  Objective:   No results found. Recent Labs    04/08/20 0204  WBC 8.2  HGB 13.6  HCT 42.1  PLT 168   Recent Labs    04/08/20 0204  NA 141  K 3.6  CL 109  CO2 24  GLUCOSE 102*  BUN 20  CREATININE 0.53  CALCIUM 9.1    Intake/Output Summary (Last 24 hours) at 04/10/2020 1026 Last data filed at 04/09/2020 1900 Gross per 24 hour  Intake 362 ml  Output --  Net 362 ml     Physical Exam: Vital Signs Blood pressure (!) 147/57, pulse 68, temperature 97.9 F (36.6 C), temperature source Oral, resp. rate 18, height 5\' 5"  (1.651 m), weight 68.4 kg, SpO2 97 %.  General: No acute distress Mood and affect are appropriate Heart: Regular rate and rhythm no rubs murmurs or extra sounds Lungs: Clear to auscultation, breathing unlabored, no rales or wheezes Abdomen: Positive bowel sounds, soft nontender to palpation, nondistended Extremities: No clubbing, cyanosis, or edema Skin:right breast lesion ~1.5-2.0 cm diameter dark pink firm, tender, central area darker, no drainage no surrounding induration      Motor 4/5 RIght delt, bi, tri, grip, HF, KE, ADF, 5/5 Left delt bi tri grip  HF, KE ADF redcued FM Right hand Sensation to LT equal BUE   Assessment/Plan: 1. 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  Physiatrist is providing close team supervision and 24 hour management of active medical problems listed below.  Physiatrist and rehab team continue to assess barriers to discharge/monitor patient progress toward functional and medical goals  Care Tool:  Bathing     Body parts bathed by patient: Right arm, Left arm, Chest, Abdomen, Front perineal area, Buttocks, Right upper leg, Left upper leg, Right lower leg, Left lower leg, Face         Bathing assist Assist Level: Contact Guard/Touching assist     Upper Body Dressing/Undressing Upper body dressing   What is the patient wearing?: Dress    Upper body assist Assist Level: Contact Guard/Touching assist    Lower Body Dressing/Undressing Lower body dressing      What is the patient wearing?: Underwear/pull up     Lower body assist Assist for lower body dressing: Contact Guard/Touching assist     Toileting Toileting Toileting Activity did not occur (Clothing management and hygiene only): N/A (no void or bm)  Toileting assist Assist for toileting: Minimal Assistance - Patient > 75%     Transfers Chair/bed transfer  Transfers assist     Chair/bed transfer assist level: Minimal Assistance - Patient > 75%     Locomotion Ambulation   Ambulation assist      Assist level: Minimal Assistance - Patient > 75% Assistive device: Hand held assist Max distance: 150   Walk 10 feet activity   Assist     Assist level: Minimal Assistance - Patient > 75% Assistive device: No Device, Hand held assist   Walk 50 feet activity   Assist    Assist level: Minimal Assistance - Patient > 75% Assistive device:  Hand held assist, No Device    Walk 150 feet activity   Assist    Assist level: Minimal Assistance - Patient > 75% Assistive device: Hand held assist, No Device    Walk 10 feet on uneven surface  activity   Assist     Assist level: Minimal Assistance - Patient > 75% Assistive device: Hand held assist   Wheelchair     Assist Will patient use wheelchair at discharge?: No      Wheelchair assist level: Minimal Assistance - Patient > 75% Max wheelchair distance: 150    Wheelchair 50 feet with 2 turns activity    Assist        Assist Level: Minimal  Assistance - Patient > 75%   Wheelchair 150 feet activity     Assist      Assist Level: Minimal Assistance - Patient > 75%   Blood pressure (!) 147/57, pulse 68, temperature 97.9 F (36.6 C), temperature source Oral, resp. rate 18, height 5\' 5"  (1.651 m), weight 68.4 kg, SpO2 97 %.  Medical Problem List and Plan: 1.  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             CIR , PT, OT  2.  Antithrombotics: -DVT/anticoagulation: SCDs             -antiplatelet therapy: Aspirin 81 mg daily.  Plavix on hold for planned outpatient biopsy and bronchoscopy 3. Pain Management: Tylenol as needed 4. Mood: Provide emotional support             -antipsychotic agents: N/A 5. Neuropsych: This patient is capable of making decisions on her own behalf. 6. Skin/Wound Care: RIght breast lesion does not appear to be a cyst, , does not appear to be infected, on CT chest looks superficial, , we can ask plastics next week to eval for bx 7. Fluids/Electrolytes/Nutrition: Routine in and outs.  CMP ordered. 8.  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 Plan outpatient biopsy and bronchoscopy 9.  Tobacco abuse.  Counsel 10.  Hyperlipidemia.  Lipitor 11.  Constipation.  Adjust bowel meds as necessary    LOS: 3 days A FACE TO FACE EVALUATION WAS PERFORMED  04/10/2020, 10:26 AM

## 2020-04-10 NOTE — Progress Notes (Signed)
Speech Language Pathology Daily Session Note  Patient Details  Name: Jessica [name] MRN: [id number] Date of Birth: 1960/11/26  Today's Date: 04/10/2020 SLP Individual Time: 1530-1559 SLP Individual Time Calculation (min): 29 min  Short Term Goals: Week 1: SLP Short Term Goal 1 (Week 1): STGs=LTGs due to ELOS  Skilled Therapeutic Interventions:  Pt was seen for skilled ST targeting cognitive goals.  SLP initially planned to work on medication management tasks to address goals for problem solving; however, pt correctly identified that her home medication regimen has not been changed since being admitted and was able to correctly identify 2 out of 2 scheduled medications as well as their times for administration independently.  As a result, task was abandoned.  Pt continues to endorse "slowness" and identified her "concentration" as not yet returned to baseline.  As a result, SLP initiated skilled education regarding distraction management techniques to improve problem solving, awareness, and memory.  All questions were answered to pt's satisfaction at this time.  Pt would also benefit from skilled education regarding memory compensatory strategies given attention's role in storage of information.  Pt was left in bed with bed alarm set and visitors at bedside.  Continue per current plan of care.    Pain Pain Assessment Pain Scale: 0-10 Pain Score: 0-No pain  Therapy/Group: Individual Therapy  Abubakar Crispo, Melanee Spry 04/10/2020, 4:00 PM

## 2020-04-10 NOTE — Progress Notes (Signed)
Physical Therapy Session Note  Patient Details  Name: Jessica [name] MRN: [id number] Date of Birth: 12/23/1960  Today's Date: 04/10/2020 PT Individual Time: 1110-1203 PT Individual Time Calculation (min): 53 min   Short Term Goals: Week 1:  PT Short Term Goal 1 (Week 1): STG=LTG due ELOS  Skilled Therapeutic Interventions/Progress Updates:   Pt received supine in bed and agreeable to PT. Supine>sit transfer with increased time and supervision assist for safety with cues to improve scooting technique to EOB. PT assist pt don socks for time management.   Gait training through hall with RW and supervision assist from PT for safety n2 x 133f. Gait training also performed without AD x [id number] supervision assist for safety and cues for symmetry in step width. One near LOB to the R, but able to correct without Assist  From PT.   Dynamic balance training with Wii fit table tilts x 3 with min assist for COM control and improved use of ankle strategy to shift weight off RLE. Pt also engaged in Fine motor task of wii bowling while on airx pad. Min assist from PT to improved midline instance and prevent R/posterior LOB initially. Noted improvement in midline stance once corrected Patient returned to room and performed stand pivot to recliner with supervision assist and RW. Pt left sitting in recliner with call bell in reach and all needs met.            Therapy Documentation Precautions:  Precautions Precautions: Fall Restrictions Weight Bearing Restrictions: Yes Pain:   denies   Therapy/Group: Individual Therapy  AuLorie Phenix/12/2019, 3:04 PM

## 2020-04-11 NOTE — Progress Notes (Signed)
NT noted patient had gotten out of bed without calling for assistance. Patient stated she did not need help. NT reminded patient of safety plan in place. This RN reported to night shift.

## 2020-04-11 NOTE — Progress Notes (Signed)
[facility] PHYSICAL MEDICINE & REHABILITATION PROGRESS NOTE   Subjective/Complaints:   No pains, no breathing issue   ROS- neg CP, SOB, N/V/D  Objective:   No results found. No results for input(s): WBC, HGB, HCT, PLT in the last 72 hours. No results for input(s): NA, K, CL, CO2, GLUCOSE, BUN, CREATININE, CALCIUM in the last 72 hours.  Intake/Output Summary (Last 24 hours) at 04/11/2020 0924 Last data filed at 04/11/2020 0842 Gross per 24 hour  Intake 720 ml  Output --  Net 720 ml     Physical Exam: Vital Signs Blood pressure 136/82, pulse 62, temperature 98.6 F (37 C), temperature source Oral, resp. rate 18, height 5\' 5"  (1.651 m), weight 68.4 kg, SpO2 95 %.  General: No acute distress Mood and affect are appropriate Heart: Regular rate and rhythm no rubs murmurs or extra sounds Lungs: Clear to auscultation, breathing unlabored, no rales or wheezes Abdomen: Positive bowel sounds, soft nontender to palpation, nondistended Extremities: No clubbing, cyanosis, or edema Skin:right breast lesion ~1.5-2.0 cm diameter dark pink firm, tender, central area darker, no drainage no surrounding induration      Motor 4/5 RIght delt, bi, tri, grip, HF, KE, ADF, 5/5 Left delt bi tri grip  HF, KE ADF redcued FM Right hand Sensation to LT equal BUE   Assessment/Plan: 1. 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  Physiatrist is providing close team supervision and 24 hour management of active medical problems listed below.  Physiatrist and rehab team continue to assess barriers to discharge/monitor patient progress toward functional and medical goals  Care Tool:  Bathing    Body parts bathed by patient: Right arm, Left arm, Chest, Abdomen, Front perineal area, Buttocks, Right upper leg, Left upper leg, Right lower leg, Left lower leg, Face         Bathing assist  Assist Level: Contact Guard/Touching assist     Upper Body Dressing/Undressing Upper body dressing   What is the patient wearing?: Dress    Upper body assist Assist Level: Contact Guard/Touching assist    Lower Body Dressing/Undressing Lower body dressing      What is the patient wearing?: Underwear/pull up     Lower body assist Assist for lower body dressing: Contact Guard/Touching assist     Toileting Toileting Toileting Activity did not occur (Clothing management and hygiene only): N/A (no void or bm)  Toileting assist Assist for toileting: Independent     Transfers Chair/bed transfer  Transfers assist     Chair/bed transfer assist level: Minimal Assistance - Patient > 75%     Locomotion Ambulation   Ambulation assist      Assist level: Minimal Assistance - Patient > 75% Assistive device: Hand held assist Max distance: 150   Walk 10 feet activity   Assist     Assist level: Minimal Assistance - Patient > 75% Assistive device: No Device, Hand held assist   Walk 50 feet activity   Assist    Assist level: Minimal Assistance - Patient > 75% Assistive device: Hand held assist, No Device    Walk 150 feet activity   Assist    Assist level: Minimal Assistance - Patient > 75% Assistive device: Hand held assist, No Device    Walk 10 feet on uneven surface  activity   Assist     Assist level: Minimal Assistance - Patient > 75% Assistive device: Hand held  assist   Wheelchair     Assist Will patient use wheelchair at discharge?: No      Wheelchair assist level: Minimal Assistance - Patient > 75% Max wheelchair distance: 150    Wheelchair 50 feet with 2 turns activity    Assist        Assist Level: Minimal Assistance - Patient > 75%   Wheelchair 150 feet activity     Assist      Assist Level: Minimal Assistance - Patient > 75%   Blood pressure 136/82, pulse 62, temperature 98.6 F (37 C), temperature source  Oral, resp. rate 18, height 5\' 5"  (1.651 m), weight 68.4 kg, SpO2 95 %.  Medical Problem List and Plan: 1.  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             CIR , PT, OT  2.  Antithrombotics: -DVT/anticoagulation: SCDs             -antiplatelet therapy: Aspirin 81 mg daily.  Plavix on hold for planned outpatient biopsy and bronchoscopy 3. Pain Management: Tylenol as needed 4. Mood: Provide emotional support             -antipsychotic agents: N/A 5. Neuropsych: This patient is capable of making decisions on her own behalf. 6. Skin/Wound Care: RIght breast lesion does not appear to be a cyst, , does not appear to be infected, on CT chest looks superficial, , we can ask plastics next week to eval for bx 7. Fluids/Electrolytes/Nutrition: Routine in and outs.  CMP ordered.intake inconsistent  Filed Weights   04/07/20 1548  Weight: 68.4 kg  will check weekly weight  8.  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 Plan outpatient biopsy and bronchoscopy 9.  Tobacco abuse.  Counsel 10.  Hyperlipidemia.  Lipitor 11.  Constipation.  last BM 7/4    LOS: 4 days A FACE TO FACE EVALUATION WAS PERFORMED  04/09/20 04/11/2020, 9:24 AM

## 2020-04-12 ENCOUNTER — Inpatient Hospital Stay (HOSPITAL_COMMUNITY): Payer: PRIVATE HEALTH INSURANCE | Admitting: Physical Therapy

## 2020-04-12 ENCOUNTER — Inpatient Hospital Stay (HOSPITAL_COMMUNITY): Payer: PRIVATE HEALTH INSURANCE

## 2020-04-12 ENCOUNTER — Inpatient Hospital Stay (HOSPITAL_COMMUNITY): Payer: PRIVATE HEALTH INSURANCE | Admitting: Occupational Therapy

## 2020-04-12 DIAGNOSIS — I6340 Cerebral infarction due to embolism of unspecified cerebral artery: Secondary | ICD-10-CM

## 2020-04-12 NOTE — Progress Notes (Signed)
Occupational Therapy Session Note  Patient Details  Name: Jessica [name] MRN: [id number] Date of Birth: 1961-09-19  Today's Date: 04/12/2020 OT Individual Time: 0705-0800 and 1500-1555 OT Individual Time Calculation (min): 55 min and 55 min   Short Term Goals: Week 1:  OT Short Term Goal 1 (Week 1): STG=LTGs secondary to estimated short LOS  Skilled Therapeutic Interventions/Progress Updates:    Session 1:Upon entering the room, pt attempting to come EOB. OT reminding pt not to attempt to get up without assistance from staff for safety. Pt verbalizing need for toileting and ambulating with RW and close supervision . CGA for toilet transfer, hygiene, and clothing management. Pt exiting the bathroom and standing at sink for grooming tasks with close supervision and able to locate items on both sides of sink without cuing. Pt obtaining clothing items from dresser with CGA for balance and seated on bed to don. Supervision to don dress with cuing to sit for safety awareness. Pt donning B shoes for feet with supervision as well. CGA to don undergarmets with B UEs engaged in functional task. Pt sitting in recliner chair with chair alarm belt donned and activated. Breakfast tray placed in front of pt. Call bell within reach.   Session 2:  Upon entering the room, pt standing from recliner chair. OT again discussing need to call for assistance before attempting to stand and move on her own. Pt agreeable to OT intervention and ambulating from room to day room with RW and close supervision. Pt seated and engaged in activity book coordination task with hand writing and focus on R UE. Pt with more gross grasp noted and needed assistance for proper placement of pen in hand. Pt engaged in card task with increased time needed for palmar translation and flipping of cards with therapist having pt place L hand in lap to keep from attempting to use. Pt returning back to room in same manner as above. Pt transferred to commode  for toileting tasks at supervision level. Pt returning to bed at end of session. Call bell and all needed items within reach and bed alarm activated.   Therapy Documentation Precautions:  Precautions Precautions: Fall Restrictions Weight Bearing Restrictions: Yes General:   Vital Signs: Therapy Vitals Temp: 98.4 F (36.9 C) Pulse Rate: 64 Resp: 17 BP: (!) 160/84 Patient Position (if appropriate): Lying Oxygen Therapy SpO2: 93 %   Therapy/Group: Individual Therapy  Alen Bleacher 04/12/2020, 7:46 AM

## 2020-04-12 NOTE — Progress Notes (Signed)
Occupational Therapy Session Note  Patient Details  Name: Jessica [name] MRN: [id number] Date of Birth: October 26, 1960  Today's Date: 04/12/2020 OT Individual Time: 1033-1100 OT Individual Time Calculation (min): 27 min   Skilled Therapeutic Interventions/Progress Updates:    Pt greeted in the recliner with no c/o pain, ADL needs met. She wanted to look at a bag that her family brought that had NMR activities for her Rt hand. OT presented pt with this bag. It was full of coloring, word search, handwriting, and doodle booklets. Placed bag on the Rt side of her recliner where pt used the Rt hand to reach for booklets and coloring/writing implements of her choice. Discussed importance of daily engagement in these [facility] activities to progress with her Rt hand skills. OT provided her with a RW bag and encouraged pt to place her activity booklets in the bag so that she could easily transport them around the home post d/c. Pt appreciative. Left pt up in the recliner with all needs within reach, safety belt fastened, completing one of her word search puzzles.    Therapy Documentation Precautions:  Precautions Precautions: Fall Restrictions Weight Bearing Restrictions: No Vital Signs: Therapy Vitals Pulse Rate: 65 Resp: 17 Patient Position (if appropriate): Sitting Oxygen Therapy SpO2: 97 % O2 Device: Room Air ADL:     Therapy/Group: Individual Therapy  Raynelle Fujikawa A Ronasia Isola 04/12/2020, 12:18 PM

## 2020-04-12 NOTE — Progress Notes (Signed)
[place] PHYSICAL MEDICINE & REHABILITATION PROGRESS NOTE   Subjective/Complaints:  Up in chair. Talking on phone to family. No new complaints  ROS: Patient denies fever, rash, sore throat, blurred vision, nausea, vomiting, diarrhea, cough, shortness of breath or chest pain, joint or back pain, headache, or mood change.   Objective:   No results found. No results for input(s): WBC, HGB, HCT, PLT in the last 72 hours. No results for input(s): NA, K, CL, CO2, GLUCOSE, BUN, CREATININE, CALCIUM in the last 72 hours.  Intake/Output Summary (Last 24 hours) at 04/12/2020 1413 Last data filed at 04/12/2020 0900 Gross per 24 hour  Intake 380 ml  Output --  Net 380 ml     Physical Exam: Vital Signs Blood pressure (!) 160/84, pulse 65, temperature 98.4 F (36.9 C), resp. rate 17, height 5\' 5"  (1.651 m), weight 68.4 kg, SpO2 97 %.  Constitutional: No distress . Vital signs reviewed. HEENT: EOMI, oral membranes moist Neck: supple Cardiovascular: RRR without murmur. No JVD    Respiratory/Chest: CTA Bilaterally without wheezes or rales. Normal effort    GI/Abdomen: BS +, non-tender, non-distended Ext: no clubbing, cyanosis, or edema Psych: pleasant and cooperative Skin:right breast lesion as below--not witnessed today  ~1.5-2.0 cm diameter dark pink firm, tender, central area darker, no drainage no surrounding induration      Motor 4/5 RIght delt, bi, tri, grip, HF, KE, ADF, 5/5 Left delt bi tri grip  HF, KE ADF redcued FM Right hand--no change Sensation to LT equal BUE   Assessment/Plan: 1. 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  Physiatrist is providing close team supervision and 24 hour management of active medical problems listed below.  Physiatrist and rehab team continue to assess barriers to discharge/monitor patient progress toward functional and medical  goals  Care Tool:  Bathing    Body parts bathed by patient: Right arm, Left arm, Chest, Abdomen, Front perineal area, Buttocks, Right upper leg, Left upper leg, Right lower leg, Left lower leg, Face         Bathing assist Assist Level: Contact Guard/Touching assist     Upper Body Dressing/Undressing Upper body dressing   What is the patient wearing?: Dress    Upper body assist Assist Level: Supervision/Verbal cueing    Lower Body Dressing/Undressing Lower body dressing      What is the patient wearing?: Underwear/pull up     Lower body assist Assist for lower body dressing: Contact Guard/Touching assist     Toileting Toileting Toileting Activity did not occur (Clothing management and hygiene only): N/A (no void or bm)  Toileting assist Assist for toileting: Contact Guard/Touching assist     Transfers Chair/bed transfer  Transfers assist     Chair/bed transfer assist level: Supervision/Verbal cueing     Locomotion Ambulation   Ambulation assist      Assist level: Minimal Assistance - Patient > 75% Assistive device: Hand held assist Max distance: 150   Walk 10 feet activity   Assist     Assist level: Minimal Assistance - Patient > 75% Assistive device: No Device, Hand held assist   Walk 50 feet activity   Assist    Assist level: Minimal Assistance - Patient > 75% Assistive device: Hand held assist, No Device    Walk 150 feet activity   Assist    Assist level: Minimal Assistance - Patient > 75% Assistive device: Hand held  assist, No Device    Walk 10 feet on uneven surface  activity   Assist     Assist level: Minimal Assistance - Patient > 75% Assistive device: Hand held assist   Wheelchair     Assist Will patient use wheelchair at discharge?: No      Wheelchair assist level: Minimal Assistance - Patient > 75% Max wheelchair distance: 150    Wheelchair 50 feet with 2 turns activity    Assist         Assist Level: Minimal Assistance - Patient > 75%   Wheelchair 150 feet activity     Assist      Assist Level: Minimal Assistance - Patient > 75%   Blood pressure (!) 160/84, pulse 65, temperature 98.4 F (36.9 C), resp. rate 17, height 5\' 5"  (1.651 m), weight 68.4 kg, SpO2 97 %.  Medical Problem List and Plan: 1.  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             CIR , PT, OT  2.  Antithrombotics: -DVT/anticoagulation: SCDs             -antiplatelet therapy: Aspirin 81 mg daily.  Plavix on hold for planned outpatient biopsy and bronchoscopy 3. Pain Management: Tylenol as needed 4. Mood: Provide emotional support             -antipsychotic agents: N/A 5. Neuropsych: This patient is capable of making decisions on her own behalf. 6. Skin/Wound Care: RIght breast lesion does not appear to be a cyst, , does not appear to be infected, on CT chest looks superficial,  -plastics/derm/vs gen surg eval this week for biopsy per primary team   -?suspicious for cancerous lesion 7. Fluids/Electrolytes/Nutrition: Routine in and outs.  CMP ordered.intake inconsistent  Filed Weights   04/07/20 1548  Weight: 68.4 kg   weekly weight  8.  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 Plan outpatient biopsy and bronchoscopy 9.  Tobacco abuse.  Counsel 10.  Hyperlipidemia.  Lipitor 11.  Constipation.  last BM 7/4    LOS: 5 days A FACE TO FACE EVALUATION WAS PERFORMED  04/09/20 04/12/2020, 2:13 PM

## 2020-04-12 NOTE — Progress Notes (Signed)
Physical Therapy Session Note  Patient Details  Name: Jessica [name] MRN: [id number] Date of Birth: May 09, 1961  Today's Date: 04/12/2020 PT Individual Time: 6580-0634 PT Individual Time Calculation (min): 42 min   Short Term Goals: Week 1:  PT Short Term Goal 1 (Week 1): STG=LTG due ELOS  Skilled Therapeutic Interventions/Progress Updates: Pt presented in recliner agreeable to therapy. Pt denies pain throughout session. Pt performed transfer from recliner with supervision and when prompted agreeable to use bathroom prior to leaving room. Pt ambulated to bathroom and performed toilet transfer with supervision (+void). Pt then ambulated to rehab gym with supervision. Participated in static balance activities with dual task of performing ping pong ball activity. Pt was able to use BUE to arrange balls with no LOB however required min A for  Correct sequencing. Pt also participated in EO,EC, feet together, gentle perturbations with feet together, and alternating shoulder flexion while standing on Airex with no LOB. Pt also participated in walking with RW and picking up cones with supervision as well as ambulating/weaving through cones with supervision. Pt then ambulated to day room and participated in Cybex Kinetron 50cm/sec x 3 min in seated for general conditioning. Pt ambulated back to room at end of session with RW and supervision and requested to return to bed. Pt performed bed mobility with supervision and left with bed alarm on, call bell within reach and needs met.      Therapy Documentation Precautions:  Precautions Precautions: Fall Restrictions Weight Bearing Restrictions: No General:   Vital Signs: Therapy Vitals Temp: 98.2 F (36.8 C) Pulse Rate: 66 Resp: 20 BP: (!) 147/82 Patient Position (if appropriate): Sitting Oxygen Therapy SpO2: 96 % O2 Device: Room Air Pain: Pain Assessment Pain Score: 0-No pain    Therapy/Group: Individual Therapy  Icker Swigert  Reymundo Winship, PTA  04/12/2020, 4:23 PM

## 2020-04-12 NOTE — Plan of Care (Signed)
  Problem: Consults Goal: RH STROKE PATIENT EDUCATION Description: See Patient Education module for education specifics  Outcome: Progressing   Problem: RH BOWEL ELIMINATION Goal: RH STG MANAGE BOWEL WITH ASSISTANCE Description: STG Manage Bowel with mod I Assistance. Outcome: Progressing Goal: RH STG MANAGE BOWEL W/MEDICATION W/ASSISTANCE Description: STG Manage Bowel with Medication with MOD I Assistance. Outcome: Progressing   Problem: RH BLADDER ELIMINATION Goal: RH STG MANAGE BLADDER WITH ASSISTANCE Description: STG Manage Bladder With mod I Assistance Outcome: Progressing   Problem: RH SKIN INTEGRITY Goal: RH STG SKIN FREE OF INFECTION/BREAKDOWN Description: Skin will be free of infection/breakdown with MOD I assist Outcome: Progressing Goal: RH STG MAINTAIN SKIN INTEGRITY WITH ASSISTANCE Description: STG Maintain Skin Integrity With MOD I Assistance. Outcome: Progressing   Problem: RH SAFETY Goal: RH STG ADHERE TO SAFETY PRECAUTIONS W/ASSISTANCE/DEVICE Description: STG Adhere to Safety Precautions With cues/reminders Assistance/Device. Outcome: Progressing   Problem: RH COGNITION-NURSING Goal: RH STG USES MEMORY AIDS/STRATEGIES W/ASSIST TO PROBLEM SOLVE Description: STG Uses Memory Aids/Strategies With Assistance to Problem Solve with supervision/cues assit . Outcome: Progressing   Problem: RH PAIN MANAGEMENT Goal: RH STG PAIN MANAGED AT OR BELOW PT'S PAIN GOAL Description: Pt will report pain to staff < 4 Outcome: Progressing   Problem: RH KNOWLEDGE DEFICIT Goal: RH STG INCREASE KNOWLEDGE OF HYPERTENSION Description: Pt will be able to identify medications for hypertension, when to take medications and to identify pts normal b/p for home care using handouts/education independently Outcome: Progressing Goal: RH STG INCREASE KNOWLEDGE OF STROKE PROPHYLAXIS Description: Pt will be able to read material provided for stroke prophylaxis and identify medications for  stroke prophylaxis and when to start medication for home care with cues/reminders assist Outcome: Progressing

## 2020-04-12 NOTE — Progress Notes (Signed)
Speech Language Pathology Daily Session Note  Patient Details  Name: Rhiannan Kievit MRN: [id number] Date of Birth: 1961-04-24  Today's Date: 04/12/2020 SLP Individual Time: 0900-0930 SLP Individual Time Calculation (min): 30 min  Short Term Goals: Week 1: SLP Short Term Goal 1 (Week 1): STGs=LTGs due to ELOS  Skilled Therapeutic Interventions: Skilled ST services focused on cognitive skills. SLP facilitated education of recall strategies WRAP. Pt utilized repeat, association and visualization strategies in novel task, pt was able to recall 4 out 4 words utilizing association to unrelated symbols and recalled 1 out 5 words (5 out 5 with max A semantic cues) with 10 minute delay and 4 out 5 words (5 out 5 with x1 semantic cue) with a 5 minute delay. Pt required mod A verbal cues to generate a list of movies, but was independent at generating 5 animals, utilized in the task mentioned above.Pt was left in room with call bell within reach and chair alarm set. ST recommends to continue skilled ST services.      Pain Pain Assessment Pain Score: 0-No pain  Therapy/Group: Individual Therapy  Lynnzie Blackson  [facility] 04/12/2020, 12:31 PM

## 2020-04-13 ENCOUNTER — Inpatient Hospital Stay (HOSPITAL_COMMUNITY): Payer: PRIVATE HEALTH INSURANCE | Admitting: Occupational Therapy

## 2020-04-13 ENCOUNTER — Inpatient Hospital Stay (HOSPITAL_COMMUNITY): Payer: PRIVATE HEALTH INSURANCE | Admitting: Speech Pathology

## 2020-04-13 ENCOUNTER — Inpatient Hospital Stay (HOSPITAL_COMMUNITY): Payer: PRIVATE HEALTH INSURANCE | Admitting: Physical Therapy

## 2020-04-13 NOTE — Progress Notes (Signed)
Pt was seen by nurse tech returning to her bed by herself from the bathroom  with no assistance. Pt stated she did not know why there was a bed alarm on her bed. Will continue to monitor.

## 2020-04-13 NOTE — Progress Notes (Signed)
Speech Language Pathology Daily Session Note  Patient Details  Name: Jessica [name] MRN: [id number] Date of Birth: 16-Aug-1961  Today's Date: 04/13/2020 SLP Individual Time: 1001-1058 SLP Individual Time Calculation (min): 57 min  Short Term Goals: Week 1: SLP Short Term Goal 1 (Week 1): STGs=LTGs due to ELOS  Skilled Therapeutic Interventions: Pt was seen for skilled ST targeting cognition. SLP facilitated session with overall Min A verbal and visual cues for problem solving and error awareness during a semi-complex monthly scheduling/calendar task. Pt reports her handwriting has changed since acute CVA, however legibility increases when she can write in larger print; suggested husband assist with writing on calendar and/or use of a large print calendar at home. Pt also recalled compensatory memory strategies discussed in last ST session (WRAP) with Min A verbal and visual cues for recall. She also recalled 100% of medications Mod I. Pt reports her cognition as primary deficit this admission and is eager to continue to work toward progress returning to cognitive baseline in follow up therapies. Pt left sitting in recliner with alarm set and needs within reach. Continue per current plan of care.          Pain Pain Assessment Pain Scale: 0-10 Pain Score: 0-No pain  Therapy/Group: Individual Therapy  Little Ishikawa 04/13/2020, 7:13 AM

## 2020-04-13 NOTE — Progress Notes (Signed)
[facility]ICAL MEDICINE [facility] RE[facility]ABILITATION PROGRESS NOTE   Subjective/Complaints:  Seen in PT, no new issues, supervision level for standing , no foot drag  ROS: Patient denies CP, SOB, N/V/D  Objective:   No results found. No results for input(s): WBC, HGB, HCT, PLT in the last 72 hours. No results for input(s): NA, K, CL, CO2, GLUCOSE, BUN, CREATININE, CALCIUM in the last 72 hours.  Intake/Output Summary (Last 24 hours) at 04/13/2020 0821 Last data filed at 04/13/2020 0725 Gross per 24 hour  Intake 532 ml  Output --  Net 532 ml     Physical Exam: Vital Signs Blood pressure 128/76, pulse 62, temperature 98 F (36.7 C), resp. rate 18, height 5\' 5"  (1.651 m), weight 68.4 kg, SpO2 94 %.   General: No acute distress Mood and affect are appropriate Heart: Regular rate and rhythm no rubs murmurs or extra sounds Lungs: Clear to auscultation, breathing unlabored, no rales or wheezes Abdomen: Positive bowel sounds, soft nontender to palpation, nondistended Extremities: No clubbing, cyanosis, or edema Skin: No evidence of breakdown, no evidence of rash   ~1.5-2.0 cm diameter dark pink firm, tender, central area darker, no drainage no surrounding induration      Motor 4/5 RIght delt, bi, tri, grip, HF, KE, ADF, 5/5 Left delt bi tri grip  HF, KE ADF redcued FM Right hand--no change Sensation to LT equal BUE   Assessment/Plan: 1. 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  Physiatrist is providing close team supervision and 24 hour management of active medical problems listed below.  Physiatrist and rehab team continue to assess barriers to discharge/monitor patient progress toward functional and medical goals  Care Tool:  Bathing    Body parts bathed by patient: Right arm, Left arm, Chest, Abdomen, Front perineal area, Buttocks, Right upper leg, Left upper  leg, Right lower leg, Left lower leg, Face         Bathing assist Assist Level: Contact Guard/Touching assist     Upper Body Dressing/Undressing Upper body dressing   What is the patient wearing?: Dress    Upper body assist Assist Level: Supervision/Verbal cueing    Lower Body Dressing/Undressing Lower body dressing      What is the patient wearing?: Underwear/pull up     Lower body assist Assist for lower body dressing: Contact Guard/Touching assist     Toileting Toileting Toileting Activity did not occur (Clothing management and hygiene only): N/A (no void or bm)  Toileting assist Assist for toileting: Supervision/Verbal cueing     Transfers Chair/bed transfer  Transfers assist     Chair/bed transfer assist level: Supervision/Verbal cueing     Locomotion Ambulation   Ambulation assist      Assist level: Minimal Assistance - Patient > 75% Assistive device: Hand held assist Max distance: 150   Walk 10 feet activity   Assist     Assist level: Minimal Assistance - Patient > 75% Assistive device: No Device, Hand held assist   Walk 50 feet activity   Assist    Assist level: Minimal Assistance - Patient > 75% Assistive device: Hand held assist, No Device    Walk 150 feet activity   Assist    Assist level: Minimal Assistance - Patient > 75% Assistive device: Hand held assist, No Device    Walk 10 feet on uneven surface  activity   Assist     Assist  level: Minimal Assistance - Patient > 75% Assistive device: Hand held assist   Wheelchair     Assist Will patient use wheelchair at discharge?: No      Wheelchair assist level: Minimal Assistance - Patient > 75% Max wheelchair distance: 150    Wheelchair 50 feet with 2 turns activity    Assist        Assist Level: Minimal Assistance - Patient > 75%   Wheelchair 150 feet activity     Assist      Assist Level: Minimal Assistance - Patient > 75%   Blood  pressure 128/76, pulse 62, temperature 98 F (36.7 C), resp. rate 18, height 5\' 5"  (1.651 m), weight 68.4 kg, SpO2 94 %.  Medical Problem List and Plan: 1.  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             CIR , PT, OT  2.  Antithrombotics: -DVT/anticoagulation: SCDs             -antiplatelet therapy: Aspirin 81 mg daily.  Plavix on hold for planned outpatient biopsy and bronchoscopy 3. Pain Management: Tylenol as needed 4. Mood: Provide emotional support             -antipsychotic agents: N/A 5. Neuropsych: This patient is capable of making decisions on her own behalf. 6. Skin/Wound Care: RIght breast lesion does not appear to be a cyst, , does not appear to be infected, on CT chest looks superficial,Ask plastics to eval for bx - may be done as OP since likely discharging THurs   -plastics/derm/vs gen surg eval this week for biopsy per primary team   -?suspicious for cancerous lesion 7. Fluids/Electrolytes/Nutrition: Routine in and outs.  CMP ordered.intake inconsistent  Filed Weights   04/07/20 1548  Weight: 68.4 kg   weekly weight  8.  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 Plan outpatient biopsy and bronchoscopy 9.  Tobacco abuse.  Counsel 10.  Hyperlipidemia.  Lipitor 11.  Constipation.  last BM 7/4    LOS: 6 days A FACE TO FACE EVALUATION WAS PERFORMED  04/09/20 04/13/2020, 8:21 AM

## 2020-04-13 NOTE — Progress Notes (Signed)
Occupational Therapy Session Note  Patient Details  Name: Jessica [name] MRN: [id number] Date of Birth: Mar 03, 1961  Today's Date: 04/13/2020 OT Individual Time: 1400-1457 OT Individual Time Calculation (min): 57 min    Short Term Goals: Week 1:  OT Short Term Goal 1 (Week 1): STG=LTGs secondary to estimated short LOS  Skilled Therapeutic Interventions/Progress Updates:    Upon entering the room, pt seated in recliner chair with no c/o pain and agreeable to OT intervention. Pt performing sit >stand with supervision and ambulating with RW into bathroom for toileting needs. While seated on commode, pt removes clothing items with supervision and min cuing for safety awareness. Pt seated on TTB for bathing at shower level with lateral leans to wash buttocks and peri area. Pt drying self while seated and then exits bathroom . Pt ambulating with RW to dresser to obtain needed items. Pt returning to sit on EOB for dressing tasks. Pt wanting to stand and step into dress and needed cuing for safety to pull over head instead. Pt donning B socks and combing hair with supervision. Pt returning to sit in recliner chair with call bell and all needed items within reach.  Chair alarm belt donned and activated.   Therapy Documentation Precautions:  Precautions Precautions: Fall Restrictions Weight Bearing Restrictions: No General:   Vital Signs: Therapy Vitals Temp: 98.7 F (37.1 C) Temp Source: Oral Pulse Rate: 73 Resp: 17 BP: (!) 142/86 Patient Position (if appropriate): Sitting Oxygen Therapy SpO2: 96 % Pain: Pain Assessment Pain Scale: 0-10 Pain Score: 0-No pain   Therapy/Group: Individual Therapy  Alen Bleacher 04/13/2020, 3:03 PM

## 2020-04-13 NOTE — Progress Notes (Signed)
Physical Therapy Session Note  Patient Details  Name: Jessica [name] MRN: [id number] Date of Birth: Sep 05, 1961  Today's Date: 04/13/2020 PT Individual Time: 0805-0900 and 1300-1330  PT Individual Time Calculation (min): 55 min and 30 min  Short Term Goals: Week 1:  PT Short Term Goal 1 (Week 1): STG=LTG due ELOS  Skilled Therapeutic Interventions/Progress Updates: Pt presented in bed agreeable to therapy. Pt denies pain during session. Performed bed mobility mod I and pt ambulated to dresser to pick clothing for day. Pt ultimately decided to remain in gown as anticipate shower later today. Pt ambulated to rehab gym with RW and supervision and ascended descended x 8 step with 1 rail and supervision. Pt ascended with step through pattern and descended with step to pattern but no LOB. Pt participated in use of rebounder for dynamic balance forward and with truncal rotation. Pt had x 1 episode where pt unable to perform task and requiring time and verbal cues to perform task. Pt stated forgot what activity she was supposed to be doing. Pt also participated in obstacle course stepping onto 4in step, ambulation on uneven surface, stepping over threshold, and weaving through cones. Pt was able to complete task x 2 with supervision with RW. Pt then ambulated to day room and participated in x 2 bouts of Connect Four for balance and cognitive remediation without AD. Pt was able to perform reaching low and crossing midline without LOB or instability noted. Pt also demonstrated stepping backwards without LOB when observing game. Pt did require min cues for goal of game on first bout but demonstrated improved initiation and sustained task on second bout. Pt ambulated back to room without AD at end of session requiring min cues for using signs to locate room. Pt left in recliner at end of session and left with belt alarm on, call bell within reach and needs met.  Tx2: Pt presented in bed completing lunch and agreeable  to therapy. Pt denies pain during session. Pt donned clothes in standing with CGA. PTA recommended next time to don clothes in sitting for safety with pt verbalizing understanding. Pt then transported via w/c to [facility] entrance for energy conservation. Pt participated in ambulated on patio and atrium area with RW and close S. Pt required intermittent verbal cues for safety with RW to avoid obstacles and when entering hospital due to RW catching on entry mat. Pt was able to ambulate back to rehab gym with RW and standing rest break in elevator with supervision overall. Pt returned to room at end of session and sat in recliner. Pt left with belt alarm on, call bell within reach and needs met.      Therapy Documentation Precautions:  Precautions Precautions: Fall Restrictions Weight Bearing Restrictions: No General:   Vital Signs: Therapy Vitals Temp: 98.7 F (37.1 C) Temp Source: Oral Pulse Rate: 73 Resp: 17 BP: (!) 142/86 Patient Position (if appropriate): Sitting Oxygen Therapy SpO2: 96 % Pain: Pain Assessment Pain Scale: 0-10 Pain Score: 0-No pain Mobility:   Locomotion :    Trunk/Postural Assessment :    Balance:   Exercises:   Other Treatments:      Therapy/Group: Individual Therapy  Katilyn Miltenberger 04/13/2020, 3:59 PM

## 2020-04-14 ENCOUNTER — Inpatient Hospital Stay (HOSPITAL_COMMUNITY): Payer: PRIVATE HEALTH INSURANCE | Admitting: Occupational Therapy

## 2020-04-14 ENCOUNTER — Inpatient Hospital Stay (HOSPITAL_COMMUNITY): Payer: PRIVATE HEALTH INSURANCE | Admitting: Physical Therapy

## 2020-04-14 ENCOUNTER — Encounter (HOSPITAL_COMMUNITY): Payer: PRIVATE HEALTH INSURANCE | Admitting: Speech Pathology

## 2020-04-14 ENCOUNTER — Encounter (HOSPITAL_COMMUNITY): Payer: PRIVATE HEALTH INSURANCE | Admitting: Occupational Therapy

## 2020-04-14 ENCOUNTER — Ambulatory Visit (HOSPITAL_COMMUNITY): Payer: PRIVATE HEALTH INSURANCE | Admitting: Physical Therapy

## 2020-04-14 MED ORDER — ATORVASTATIN CALCIUM 80 MG PO TABS: 80.0000 mg | ORAL_TABLET | Freq: Every day | ORAL | 0 refills | Status: AC

## 2020-04-14 MED ORDER — ACETAMINOPHEN 325 MG PO TABS: 650.0000 mg | ORAL_TABLET | ORAL | Status: AC | PRN

## 2020-04-14 MED ORDER — UMECLIDINIUM BROMIDE 62.5 MCG/INH IN AEPB: 1.0000 | INHALATION_SPRAY | Freq: Every day | RESPIRATORY_TRACT | 0 refills | Status: AC

## 2020-04-14 NOTE — Discharge Summary (Signed)
Physician Discharge Summary  Patient ID: Jessica [name] MRN: [id number] DOB/AGE: 02/06/1961 59 y.o.  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 A more nodular masslike opacity is present in the right mid to lower lung. There is mild central vascular congestion and central airways thickening. No pneumothorax or visible effusion. The aorta is calcified. The remaining cardiomediastinal contours are unremarkable. No acute osseous or soft tissue abnormality. IMPRESSION: 1. Low lung volumes with streaky basilar atelectasis. 2. Additional central vascular congestion, airways thickening, and few septal lines could reflect developing interstitial edema. 3. Nodular opacity in the right mid to lower lung, recommend further evaluation with CT imaging. Electronically Signed   By: Kreg Shropshire M.D.   On: 04/03/2020 21:36   CT Head Wo Contrast  Result Date: 04/03/2020 CLINICAL DATA:  Right-sided weakness/numbness which has resolved. Previous history of coil embolization of intracerebral aneurysms. EXAM: CT HEAD WITHOUT CONTRAST TECHNIQUE: Contiguous axial images were obtained from the base of the skull through the vertex without intravenous contrast. COMPARISON:  01/20/2017 FINDINGS: Brain: Ventricles, cisterns and other CSF spaces are within normal. There is evidence of chronic ischemic microvascular disease. There is a small old left occipital infarct. Possible old lacunar infarct over the left cerebellum. Moderate streak artifact from  patient's previous aneurysm coiling involving bilateral posterior communicating arteries as well as region of the anterior communicating artery. No evidence of mass, mass effect or acute hemorrhage. No evidence of acute infarction. Vascular: Embolization coils as described. Skull: Normal. Negative for fracture or focal lesion. Sinuses/Orbits: No acute finding. Other: None. IMPRESSION: 1.  No acute findings. 2. 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 3. Chronic ischemic microvascular disease. Possible old left cerebellar lacunar infarct as well as small old left occipital infarct. Electronically Signed   By: Elberta Fortis M.D.   On: 04/03/2020 10:54   CT CHEST WO CONTRAST  Result Date: 04/04/2020 CLINICAL DATA:  Shortness of breath, right lung nodularity on chest x-ray EXAM: CT CHEST WITHOUT CONTRAST TECHNIQUE: Multidetector CT imaging of the chest was performed following the standard protocol without IV contrast. COMPARISON:  04/03/2020 FINDINGS: Cardiovascular: Unenhanced imaging of the heart and great vessels demonstrates no pericardial effusion. Mild atherosclerosis of the thoracic aorta. Mediastinum/Nodes: Pathologic mediastinal adenopathy is identified. Largest lymph node in the AP window measures 13 mm in short axis, reference image 52/3. Precarinal adenopathy measures up to 14 mm in short axis, reference image 56/3. Subcarinal and hilar adenopathy are also identified, more difficult to measure given lack of IV contrast. The thyroid, trachea, and esophagus are grossly unremarkable. Lungs/Pleura: Spiculated left upper lobe mass is identified with peripheral cavitation, measuring up to 3.3 x 2.2 cm reference image 48/4. There is a second cavitating nodule within the lateral segment right middle lobe measuring 2.0 x 1.9 cm reference image 98/4. This corresponds to chest x-ray finding. There is background emphysema. No effusion or pneumothorax. Dependent  hypoventilatory changes are noted. Upper Abdomen: Indeterminate 10 mm nodule within the left adrenal gland is noted. The remainder of the upper abdomen is unremarkable. Musculoskeletal: No acute or destructive bony  lesions. Reconstructed images demonstrate no additional findings. IMPRESSION: 1. 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 The left upper lobe nodule is spiculated. Malignancy is the diagnosis of exclusion. Far less likely cavitating infection or septic emboli could be considered in the appropriate setting. 2. Pathologic mediastinal and hilar adenopathy worrisome for nodal metastases. 3. Nonspecific left adrenal nodule, metastatic disease not excluded. 4. Aortic Atherosclerosis (ICD10-I70.0) and Emphysema (ICD10-J43.9). These results will be called to the ordering clinician or representative by the Radiologist Assistant, and communication documented in the PACS or [employer]. Electronically Signed   By: Sharlet Salina M.D.   On: 04/04/2020 01:41   MR ANGIO HEAD WO CONTRAST  Result Date: 04/03/2020 CLINICAL DATA:  TIA. Right-sided numbness and weakness. History of aneurysm coiling. EXAM: MRI HEAD WITHOUT AND WITH CONTRAST MRA HEAD WITHOUT   CONTRAST MRA NECK WITHOUT   CONTRAST TECHNIQUE: Multiplanar, multiecho pulse sequences of the brain and surrounding structures were obtained without and with intravenous contrast. Angiographic images of the head were obtained using MRA technique without contrast. Angiographic sequences of the neck and surrounding structures were obtained without intravenous contrast. CONTRAST:  7.33mL GADAVIST GADOBUTROL 1 MMOL/ML IV SOLN COMPARISON:  None. FINDINGS: MRI HEAD FINDINGS Brain: Multiple areas of recent infarction bilaterally. Acute/subacute infarct in the left frontal lobe over the convexity involving cortex and white matter. Cortical enhancement over the left frontal convexity without restricted diffusion suggesting late subacute  infarct. Small area of acute or subacute infarct right medial parietal lobe. Subacute infarct with mild enhancement in the left parietal lobe. Cluster of small acute infarcts in the right posterior cerebellum with mild enhancement. Moderate chronic microvascular ischemic change in the white matter and pons. Mild atrophy without hydrocephalus. No intracranial hemorrhage or mass. Vascular: Normal arterial flow voids. Skull and upper cervical spine: No focal skeletal lesion. Sinuses/Orbits: Paranasal sinuses clear.  Normal orbit Other: None MRA HEAD FINDINGS Both vertebral arteries patent to the basilar. Left PICA patent. Right PICA not visualized but there is a right AICA which may contribute to this territory. Basilar widely patent. Fetal origin right posterior cerebral artery. Mild stenosis right P2 segment. Left posterior cerebral artery widely patent. Internal carotid artery patent bilaterally without stenosis. Hypoplastic right A1 segment which is small. Left A1 widely patent. Occlusion left A2 segment. Mild stenosis right A2 segment. Middle cerebral arteries patent bilaterally without significant stenosis. MRA NECK FINDINGS Antegrade flow in carotid and vertebral arteries bilaterally. Carotid bifurcation patent bilaterally without stenosis. Both vertebral arteries are patent without stenosis. IMPRESSION: 1. Multiple areas of acute and subacute infarct in the brain bilaterally suggestive of emboli. 2. Moderate chronic microvascular ischemic change in the white matter and pons. 3. Occlusion left A2 segment and mild stenosis right A2 segment. Hypoplastic right A1 segment. 4. Mild stenosis right P2 segment. Middle cerebral arteries patent bilaterally. Electronically Signed   By: Marlan Palau M.D.   On: 04/03/2020 14:38   MR ANGIO NECK WO CONTRAST  Result Date: 04/03/2020 CLINICAL DATA:  TIA. Right-sided numbness and weakness. History of aneurysm coiling. EXAM: MRI HEAD WITHOUT AND WITH CONTRAST MRA HEAD  WITHOUT   CONTRAST MRA NECK WITHOUT   CONTRAST TECHNIQUE: Multiplanar, multiecho pulse sequences of the brain and surrounding structures were obtained without and with intravenous contrast. Angiographic images of the head were obtained using MRA technique without contrast. Angiographic sequences of the neck and surrounding structures were obtained without intravenous contrast. CONTRAST:  7.89mL GADAVIST GADOBUTROL 1 MMOL/ML IV SOLN COMPARISON:  None.  FINDINGS: MRI HEAD FINDINGS Brain: Multiple areas of recent infarction bilaterally. Acute/subacute infarct in the left frontal lobe over the convexity involving cortex and white matter. Cortical enhancement over the left frontal convexity without restricted diffusion suggesting late subacute infarct. Small area of acute or subacute infarct right medial parietal lobe. Subacute infarct with mild enhancement in the left parietal lobe. Cluster of small acute infarcts in the right posterior cerebellum with mild enhancement. Moderate chronic microvascular ischemic change in the white matter and pons. Mild atrophy without hydrocephalus. No intracranial hemorrhage or mass. Vascular: Normal arterial flow voids. Skull and upper cervical spine: No focal skeletal lesion. Sinuses/Orbits: Paranasal sinuses clear.  Normal orbit Other: None MRA HEAD FINDINGS Both vertebral arteries patent to the basilar. Left PICA patent. Right PICA not visualized but there is a right AICA which may contribute to this territory. Basilar widely patent. Fetal origin right posterior cerebral artery. Mild stenosis right P2 segment. Left posterior cerebral artery widely patent. Internal carotid artery patent bilaterally without stenosis. Hypoplastic right A1 segment which is small. Left A1 widely patent. Occlusion left A2 segment. Mild stenosis right A2 segment. Middle cerebral arteries patent bilaterally without significant stenosis. MRA NECK FINDINGS Antegrade flow in carotid and vertebral arteries  bilaterally. Carotid bifurcation patent bilaterally without stenosis. Both vertebral arteries are patent without stenosis. IMPRESSION: 1. Multiple areas of acute and subacute infarct in the brain bilaterally suggestive of emboli. 2. Moderate chronic microvascular ischemic change in the white matter and pons. 3. Occlusion left A2 segment and mild stenosis right A2 segment. Hypoplastic right A1 segment. 4. Mild stenosis right P2 segment. Middle cerebral arteries patent bilaterally. Electronically Signed   By: Marlan Palauharles  Clark M.D.   On: 04/03/2020 14:38   MR Brain W and Wo Contrast  Result Date: 04/03/2020 CLINICAL DATA:  TIA. Right-sided numbness and weakness. History of aneurysm coiling. EXAM: MRI HEAD WITHOUT AND WITH CONTRAST MRA HEAD WITHOUT   CONTRAST MRA NECK WITHOUT   CONTRAST TECHNIQUE: Multiplanar, multiecho pulse sequences of the brain and surrounding structures were obtained without and with intravenous contrast. Angiographic images of the head were obtained using MRA technique without contrast. Angiographic sequences of the neck and surrounding structures were obtained without intravenous contrast. CONTRAST:  7.653mL GADAVIST GADOBUTROL 1 MMOL/ML IV SOLN COMPARISON:  None. FINDINGS: MRI HEAD FINDINGS Brain: Multiple areas of recent infarction bilaterally. Acute/subacute infarct in the left frontal lobe over the convexity involving cortex and white matter. Cortical enhancement over the left frontal convexity without restricted diffusion suggesting late subacute infarct. Small area of acute or subacute infarct right medial parietal lobe. Subacute infarct with mild enhancement in the left parietal lobe. Cluster of small acute infarcts in the right posterior cerebellum with mild enhancement. Moderate chronic microvascular ischemic change in the white matter and pons. Mild atrophy without hydrocephalus. No intracranial hemorrhage or mass. Vascular: Normal arterial flow voids. Skull and upper cervical spine: No  focal skeletal lesion. Sinuses/Orbits: Paranasal sinuses clear.  Normal orbit Other: None MRA HEAD FINDINGS Both vertebral arteries patent to the basilar. Left PICA patent. Right PICA not visualized but there is a right AICA which may contribute to this territory. Basilar widely patent. Fetal origin right posterior cerebral artery. Mild stenosis right P2 segment. Left posterior cerebral artery widely patent. Internal carotid artery patent bilaterally without stenosis. Hypoplastic right A1 segment which is small. Left A1 widely patent. Occlusion left A2 segment. Mild stenosis right A2 segment. Middle cerebral arteries patent bilaterally without significant stenosis. MRA NECK FINDINGS Antegrade flow in  carotid and vertebral arteries bilaterally. Carotid bifurcation patent bilaterally without stenosis. Both vertebral arteries are patent without stenosis. IMPRESSION: 1. Multiple areas of acute and subacute infarct in the brain bilaterally suggestive of emboli. 2. Moderate chronic microvascular ischemic change in the white matter and pons. 3. Occlusion left A2 segment and mild stenosis right A2 segment. Hypoplastic right A1 segment. 4. Mild stenosis right P2 segment. Middle cerebral arteries patent bilaterally. Electronically Signed   By: Marlan Palau M.D.   On: 04/03/2020 14:38   US RENAL  Result Date: 04/04/2020 CLINICAL DATA:  Hematuria EXAM: RENAL / URINARY TRACT ULTRASOUND COMPLETE COMPARISON:  CT dated March 15, 2015 FINDINGS: Right Kidney: Renal measurements: 11.2 x 5.1 x 4.4 cm = volume: 130 mL . Echogenicity within normal limits. No mass or hydronephrosis visualized. A small 1.4 cm cyst is noted. Left Kidney: Renal measurements: 10.2 x 5.1 x 4.7 cm = volume: 126 mL. Echogenicity within normal limits. No mass or hydronephrosis visualized. Bladder: Appears normal for degree of bladder distention. Other: None. IMPRESSION: Unremarkable study. Electronically Signed   By: Katherine Mantle M.D.   On: 04/04/2020  21:46   ECHOCARDIOGRAM COMPLETE  Result Date: 04/04/2020    ECHOCARDIOGRAM REPORT   Patient Name:   CHARNESE FEDERICI Date of Exam: 04/04/2020 Medical Rec #:  [id number]    Height:       65.0 in Accession #:    [id number]   Weight:       162.8 lb Date of Birth:  1961-09-14    BSA:          1.812 m Patient Age:    59 years     BP:           162/87 mmHg Patient Gender: F            HR:           74 bpm. Exam Location:  Inpatient Procedure: 2D Echo, Color Doppler, Cardiac Doppler and Intracardiac            Opacification Agent Indications:    TIA  History:        Patient has no prior history of Echocardiogram examinations.  Sonographer:    Roosvelt Maser RDCS Referring Phys: [id number] SARA-MAIZ A THOMAS IMPRESSIONS  1. Left ventricular ejection fraction, by estimation, is 60 to 65%. The left ventricle has normal function. The left ventricle has no regional wall motion abnormalities. Left ventricular diastolic parameters are consistent with Grade I diastolic dysfunction (impaired relaxation).  2. Right ventricular systolic function is normal. The right ventricular size is normal.  3. The mitral valve is normal in structure. No evidence of mitral valve regurgitation. No evidence of mitral stenosis.  4. The aortic valve is normal in structure. Aortic valve regurgitation is mild. No aortic stenosis is present.  5. The inferior vena cava is normal in size with greater than 50% respiratory variability, suggesting right atrial pressure of 3 mmHg. FINDINGS  Left Ventricle: Left ventricular ejection fraction, by estimation, is 60 to 65%. The left ventricle has normal function. The left ventricle has no regional wall motion abnormalities. Definity contrast agent was given IV to delineate the left ventricular  endocardial borders. The left ventricular internal cavity size was normal in size. There is no left ventricular hypertrophy. Left ventricular diastolic parameters are consistent with Grade I diastolic dysfunction (impaired  relaxation). Normal left ventricular filling pressure. Right Ventricle: The right ventricular size is normal. No increase in right ventricular wall thickness. Right ventricular systolic  function is normal. Left Atrium: Left atrial size was normal in size. Right Atrium: Right atrial size was normal in size. Pericardium: There is no evidence of pericardial effusion. Mitral Valve: The mitral valve is normal in structure. Normal mobility of the mitral valve leaflets. No evidence of mitral valve regurgitation. No evidence of mitral valve stenosis. Tricuspid Valve: The tricuspid valve is normal in structure. Tricuspid valve regurgitation is not demonstrated. No evidence of tricuspid stenosis. Aortic Valve: The aortic valve is normal in structure. Aortic valve regurgitation is mild. No aortic stenosis is present. Pulmonic Valve: The pulmonic valve was normal in structure. Pulmonic valve regurgitation is not visualized. No evidence of pulmonic stenosis. Aorta: The aortic root is normal in size and structure. Venous: The inferior vena cava is normal in size with greater than 50% respiratory variability, suggesting right atrial pressure of 3 mmHg. IAS/Shunts: No atrial level shunt detected by color flow Doppler.  LEFT VENTRICLE PLAX 2D LVIDd:         4.30 cm  Diastology LVIDs:         2.80 cm  LV e' lateral:   6.31 cm/s LV PW:         0.80 cm  LV E/e' lateral: 8.7 LV IVS:        0.80 cm  LV e' medial:    5.00 cm/s LVOT diam:     1.90 cm  LV E/e' medial:  11.0 LVOT Area:     2.84 cm  RIGHT VENTRICLE          IVC RV Basal diam:  2.80 cm  IVC diam: 1.20 cm LEFT ATRIUM           Index       RIGHT ATRIUM           Index LA diam:      2.20 cm 1.21 cm/m  RA Area:     16.70 cm LA Vol (A4C): 68.6 ml 37.85 ml/m RA Volume:   45.60 ml  25.16 ml/m   AORTA Ao Root diam: 2.80 cm Ao Asc diam:  2.90 cm MITRAL VALVE MV Area (PHT): 4.31 cm    SHUNTS MV Decel Time: 176 msec    Systemic Diam: 1.90 cm MV E velocity: 54.80 cm/s MV A velocity:  79.70 cm/s MV E/A ratio:  0.69 Mihai Croitoru MD Electronically signed by Thurmon Fair MD Signature Date/Time: 04/04/2020/5:21:05 PM    Final    VAS [place] LOWER EXTREMITY VENOUS (DVT)  Result Date: 04/04/2020  Lower Venous DVTStudy Indications: Stroke.  Limitations: Positioning. Comparison Study: No prior study on file Performing Technologist: Sherren Kerns RVS  Examination Guidelines: A complete evaluation includes B-mode imaging, spectral Doppler, color Doppler, and power Doppler as needed of all accessible portions of each vessel. Bilateral testing is considered an integral part of a complete examination. Limited examinations for reoccurring indications may be performed as noted. The reflux portion of the exam is performed with the patient in reverse Trendelenburg.  +---------+---------------+---------+-----------+----------+--------------+ RIGHT    CompressibilityPhasicitySpontaneityPropertiesThrombus Aging +---------+---------------+---------+-----------+----------+--------------+ CFV      Full           Yes      Yes                                 +---------+---------------+---------+-----------+----------+--------------+ SFJ      Full                                                        +---------+---------------+---------+-----------+----------+--------------+  FV Prox  Full                                                        +---------+---------------+---------+-----------+----------+--------------+ FV Mid   Full                                                        +---------+---------------+---------+-----------+----------+--------------+ FV DistalFull                                                        +---------+---------------+---------+-----------+----------+--------------+ PFV      Full                                                        +---------+---------------+---------+-----------+----------+--------------+ POP      Full            Yes      Yes                                 +---------+---------------+---------+-----------+----------+--------------+ PTV      Full                                                        +---------+---------------+---------+-----------+----------+--------------+ PERO     Full                                                        +---------+---------------+---------+-----------+----------+--------------+   +---------+---------------+---------+-----------+----------+--------------+ LEFT     CompressibilityPhasicitySpontaneityPropertiesThrombus Aging +---------+---------------+---------+-----------+----------+--------------+ CFV      Full           Yes      Yes                                 +---------+---------------+---------+-----------+----------+--------------+ SFJ      Full                                                        +---------+---------------+---------+-----------+----------+--------------+ FV Prox  Full                                                        +---------+---------------+---------+-----------+----------+--------------+   FV Mid   Full                                                        +---------+---------------+---------+-----------+----------+--------------+ FV DistalFull                                                        +---------+---------------+---------+-----------+----------+--------------+ PFV      Full                                                        +---------+---------------+---------+-----------+----------+--------------+ POP      Full           Yes      Yes                                 +---------+---------------+---------+-----------+----------+--------------+ PTV      Full                                                        +---------+---------------+---------+-----------+----------+--------------+ PERO     Full                                                         +---------+---------------+---------+-----------+----------+--------------+     Summary: BILATERAL: - No evidence of deep vein thrombosis seen in the lower extremities, bilaterally. -   *Jessica table(s) above for measurements and observations. Electronically signed by Waverly Ferrari MD on 04/04/2020 at 6:48:44 PM.    Final     Labs:  Basic Metabolic Panel: No results for input(s): NA, K, CL, CO2, GLUCOSE, BUN, CREATININE, CALCIUM, MG, PHOS in the last 168 hours.  CBC: No results for input(s): WBC, NEUTROABS, HGB, HCT, MCV, PLT in the last 168 hours.  CBG: No results for input(s): GLUCAP in the last 168 hours.  Family history.  Hypertension hyperlipidemia.  Denies any colon cancer, esophageal cancer or rectal cancer  Brief HPI:   Jessica [name] is a 59 y.o. 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  Patient lives with spouse independent prior to admission working as an Psychologist, counselling.  Presented 04/03/2020 with right leg weakness and numbness.  CT and MRI as well as MRA of the head and neck showed multiple areas of acute and subacute bilateral infarctions suggestive of embolic source.  Moderate chronic microvascular ischemic change in the white matter and pons.  Occlusion left A2 segment and mild stenosis right A2 segment.  Patient did not receive TPA.  Echocardiogram ejection fraction 65% grade 1 diastolic dysfunction no regional wall motion abnormalities.  Chest CT showed a left upper lobe and right middle lobe cavitating pulmonary nodule, pathologic mediastinal and hilar adenopathy worrisome for nodal metastasis.  Admission chemistries unremarkable except BNP 144.  Maintained on aspirin and Plavix for CVA prophylaxis.  Plan outpatient biopsy of pulmonary nodules.  Plavix was placed on hold for anticipation of upcoming biopsy.  Patient tolerating a regular diet.  Patient was admitted for a comprehensive rehab program   Hospital  Course: Jessica [name] was admitted to rehab 04/07/2020 for inpatient therapies to consist of PT, ST and OT at least three hours five days a week. Past admission physiatrist, therapy team and rehab RN have worked together to provide customized collaborative inpatient rehab.  Pertaining to patient's bilateral anterior posterior infarcts remained stable she would follow neurology services.  She remained on aspirin therapy.  Plavix had been on hold for planned outpatient biopsy and bronchoscopy for work-up of left upper lobe and right middle lobe cavitating pulmonary nodules.  Blood pressure remains well controlled on no antihypertensive medications..  She did have a history of tobacco abuse receiving counsel regards to cessation of nicotine products.  Lipitor for hyperlipidemia.  Patient did have a right breast lesion did not appear to be infected and would need to be followed as outpatient with plastics versus dermatology versus general surgery and consideration of possible biopsy.   Blood pressures were monitored on TID basis and controlled      Rehab course: During patient's stay in rehab weekly team conferences were held to monitor patient's progress, set goals and discuss barriers to discharge. At admission, patient required minimal assist 200 feet, minimal assist sit to stand, supervision supine to sit.  Supervision upper body bathing minimal assist lower body bathing set up upper body bathing minimal assist lower body dressing  Physical exam Blood pressure 129/90 pulse 63 temperature 93 respirations 18 oxygen saturation 90% HEENT Head.  Normocephalic and atraumatic Eyes.  Pupils round and reactive to light no discharge.nystagmus Neck.  Supple nontender no JVD without thyromegaly Cardiac regular rate rhythm without any extra sounds or murmur heard Abdomen.  Soft nontender positive bowel sounds without rebound Respiratory effort normal no respiratory distress without wheeze Motor.  Right upper  right lower extremity 3/5 proximal distal left upper left lower extremity 5/5 proximal distal Neurological.  Alert oriented x3 fair awareness of deficits   /She  has had improvement in activity tolerance, balance, postural control as well as ability to compensate for deficits. Francis Dowse has had improvement in functional use RUE/LUE  and RLE/LLE as well as improvement in awareness.  Sessions focused on family education patient performed transfers ambulation car transfers and stairs.  Bed mobility modified independent ambulates to the dresser to pick up clothing for the day.  Up and down stairs with close supervision.  Gather his belongings for activities delivered and homemaking.  Patient did require minimal assist for basic to mildly complex task due to impairments impacting her basic problem solving and short-term memory.  Full teaching completed plan discharged home       Disposition: Discharge to home    Diet: Regular  Special Instructions: No driving smoking or alcohol  Follow-up pulmonary services in regards to lung nodule biopsy [phone]  Medications at discharge 1.  Tylenol as needed 2.  Aspirin 81 mg p.o. daily 3.  Lipitor 80 mg p.o. nightly 4.  Ellipta 1 puff daily   Discharge Instructions    Ambulatory referral to Neurology   Complete by: As directed    An  appointment is requested in approximately 4 weeks multiple bilateral anterior posterior infarction   Ambulatory referral to Physical Medicine Rehab   Complete by: As directed    Moderate complexity follow-up 1 to 2 weeks multiple bilateral anterior posterior infarction   Ambulatory referral to Pulmonology   Complete by: As directed    Referral for pulmonary nodule biopsies   Reason for referral: Lung Mass/Lung Nodule       Follow-up Information    Kirsteins, Victorino Sparrow, MD Follow up.   Specialty: Physical Medicine and Rehabilitation Why: Office to call for appointment Contact information: [address] [place] [address] [phone]        Kalman Shan, MD Follow up.   Specialty: Pulmonary Disease Why: Call for appointment to schedule lung nodule biopsy Contact information: [address] [place] [address] [place] [address] [phone]               Signed: Mcarthur Rossetti Dalyn Becker 04/15/2020, 5:08 AM

## 2020-04-14 NOTE — Progress Notes (Signed)
Speech Language Pathology Discharge Summary  Patient Details  Name: Jessica [name] MRN: [id number] Date of Birth: Mar 15, 1961     Patient has met 3 of 3 long term goals.  Patient to discharge at [facility] level.  Reasons goals not met: n/a   Clinical Impression/Discharge Summary:   Pt made functional gains and met 3 out of 3 long term goals this admission. Pt currently requires Min assist for basic to mildly complex tasks due to impairments impacting her basic problem solving, short term memory, and emergent awareness; as a result she will require 24/7 supervision at discharge for greatest safety. Pt has demonstrated improved basic problem solving and use of compensatory memory strategies. However, given cognitive deficits still present, recommend pt continue to receive skilled ST services upon discharge. Pt and family education is complete at this time.     Care Partner:  Caregiver Able to Provide Assistance: Yes  Type of Caregiver Assistance: Cognitive  Recommendation:  Home Health SLP;24 hour supervision/assistance  Rationale for SLP Follow Up: Maximize cognitive function and independence;Reduce caregiver burden   Equipment: none   Reasons for discharge: Discharged from hospital   Patient/Family Agrees with Progress Made and Goals Achieved: Yes    Arbutus Leas 04/14/2020, 7:31 AM

## 2020-04-14 NOTE — Patient Care Conference (Signed)
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 Claudette Laws Care Coodinator Present: Chana Bode, RN, BSN, CRRN;Christina Vita Barley, BSW Nurse Present: Margot Ables, LPN PT Present: Harless Litten, PTA;Grier Rocher, PT OT Present: Jackquline Denmark, OT SLP Present: Feliberto Gottron, SLP PPS Coordinator present : Fae Pippin, SLP     Current Status/Progress Goal Weekly Team Focus  Bowel/Bladder   Pt continent of B and B   remain continent of b and b  assess q shift and prn   Swallow/Nutrition/ Hydration             ADL's   supervision with RW and supervision - CGA without AD for self care and all functional transfers  supervision overall  self care retraining, cognition, family edu, d/c planning   Mobility   mod I bed mobility, supervision transfers and gait with RW  mod I bed mobility and transfers, supervision gait  higher level balance, d/c planning   Communication             Safety/Cognition/ Behavioral Observations  Min A  Min-Mod  family education 7/7, recall with strategies, functional problem solving and error awareness   Pain   pt c/o no pain at this time  remain pain free  asess pain q shift and prn    Skin   raised wound on breast open to air, skin tear on left leg foam in place   free of infection and break down  assess skin q shift and prn      Team Discussion:  Discharge Planning/Teaching  Needs:  Goal to discharge home  Will scedule if recc   Current Update: on target  Current Barriers to Discharge:  None  Possible Resolutions to Barriers:   Patient on target to meet rehab goals: yes Family education completed 04/14/20; very impulsive with decreased safety awareness. Husband to provide cues for safety. *See Care Plan and progress notes for long and short-term goals.   Revisions to Treatment Plan:  Recommendations for [facility] changed to OP therapies    Medical Summary Current Status: Will need oncology eval post d/c , needs skin biopsy as OP Weekly Focus/Goal: family ed , d/c planning  Barriers to Discharge: Other (comments)  Barriers to Discharge Comments: impulsiviy Possible Resolutions to Barriers: teach husband impulsivity management   Continued Need for Acute Rehabilitation Level of Care: The patient requires daily medical management by a physician with specialized training in physical medicine and rehabilitation for the following reasons: Direction of a multidisciplinary physical rehabilitation program to maximize functional independence : Yes Medical management of patient stability for increased activity during participation in an intensive rehabilitation regime.: Yes Analysis of laboratory values and/or radiology reports with any subsequent need for medication adjustment and/or medical intervention. : Yes   I attest that I was present, lead the team conference, and concur with the assessment and plan of the team.   Pamelia Hoit 04/14/2020,  1:06 PM

## 2020-04-14 NOTE — Progress Notes (Signed)
[facility] PHYSICAL MEDICINE & REHABILITATION PROGRESS NOTE   Subjective/Complaints:  Husband in for family training , no issues overnite  ROS: Patient denies CP, SOB, N/V/D  Objective:   No results found. No results for input(s): WBC, HGB, HCT, PLT in the last 72 hours. No results for input(s): NA, K, CL, CO2, GLUCOSE, BUN, CREATININE, CALCIUM in the last 72 hours.  Intake/Output Summary (Last 24 hours) at 04/14/2020 1114 Last data filed at 04/14/2020 0830 Gross per 24 hour  Intake 462 ml  Output --  Net 462 ml     Physical Exam: Vital Signs Blood pressure (!) 148/75, pulse 71, temperature 98.4 F (36.9 C), resp. rate 18, height 5' 5" (1.651 m), weight 68.4 kg, SpO2 91 %.    General: No acute distress Mood and affect are appropriate Heart: Regular rate and rhythm no rubs murmurs or extra sounds Lungs: Clear to auscultation, breathing unlabored, no rales or wheezes Abdomen: Positive bowel sounds, soft nontender to palpation, nondistended Extremities: No clubbing, cyanosis, or edema Skin: No evidence of breakdown, no evidence of rash      ~1.5-2.0 cm diameter dark pink firm, tender, central area darker, no drainage no surrounding induration      Motor 4/5 RIght delt, bi, tri, grip, HF, KE, ADF, 5/5 Left delt bi tri grip  HF, KE ADF redcued FM Right hand--no change Sensation to LT equal BUE   Assessment/Plan: 1. 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  Physiatrist is providing close team supervision and 24 hour management of active medical problems listed below.  Physiatrist and rehab team continue to assess barriers to discharge/monitor patient progress toward functional and medical goals  Care Tool:  Bathing    Body parts bathed by patient: Right arm, Left arm, Chest, Abdomen, Front perineal area, Buttocks, Right upper leg, Left upper leg,  Right lower leg, Left lower leg, Face         Bathing assist Assist Level: Supervision/Verbal cueing     Upper Body Dressing/Undressing Upper body dressing   What is the patient wearing?: Dress    Upper body assist Assist Level: Supervision/Verbal cueing    Lower Body Dressing/Undressing Lower body dressing      What is the patient wearing?: Underwear/pull up     Lower body assist Assist for lower body dressing: Supervision/Verbal cueing     Toileting Toileting Toileting Activity did not occur (Clothing management and hygiene only): N/A (no void or bm)  Toileting assist Assist for toileting: Supervision/Verbal cueing     Transfers Chair/bed transfer  Transfers assist     Chair/bed transfer assist level: Supervision/Verbal cueing     Locomotion Ambulation   Ambulation assist      Assist level: Supervision/Verbal cueing Assistive device: Walker-rolling Max distance: 247f   Walk 10 feet activity   Assist     Assist level: Supervision/Verbal cueing Assistive device: Walker-rolling   Walk 50 feet activity   Assist    Assist level: Supervision/Verbal cueing Assistive device: Walker-rolling    Walk 150 feet activity   Assist    Assist level: Supervision/Verbal cueing Assistive device: Walker-rolling    Walk 10 feet on uneven surface  activity   Assist     Assist level: Supervision/Verbal cueing Assistive device: WAeronautical engineerWill patient use wheelchair at discharge?: No      Wheelchair assist level: Minimal Assistance -  Patient > 75% Max wheelchair distance: 150    Wheelchair 50 feet with 2 turns activity    Assist        Assist Level: Minimal Assistance - Patient > 75%   Wheelchair 150 feet activity     Assist      Assist Level: Minimal Assistance - Patient > 75%   Blood pressure (!) 148/75, pulse 71, temperature 98.4 F (36.9 C), resp. rate 18, height 5' 5" (1.651 m),  weight 68.4 kg, SpO2 91 %.  Medical Problem List and Plan: 1.  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             CIR , PT, OT , Team conference today please see physician documentation under team conference tab, met with team  to discuss problems,progress, and goals. Formulized individual treatment plan based on medical history, underlying problem and comorbidities. Plan D/C in am  2.  Antithrombotics: -DVT/anticoagulation: SCDs             -antiplatelet therapy: Aspirin 81 mg daily.  Plavix on hold for planned outpatient biopsy and bronchoscopy 3. Pain Management: Tylenol as needed 4. Mood: Provide emotional support             -antipsychotic agents: N/A 5. Neuropsych: This patient is capable of making decisions on her own behalf. 6. Skin/Wound Care: RIght breast lesion does not appear to be a cyst, , does not appear to be infected, on CT chest looks superficial,Ask plastics to eval for bx - may be done as OP since likely discharging THurs   -plastics/derm/vs gen surg eval this week for biopsy per primary team   -?suspicious for cancerous lesion 7. Fluids/Electrolytes/Nutrition: Routine in and outs.  CMP ordered.intake inconsistent  Filed Weights   04/07/20 1548  Weight: 68.4 kg   weekly weight  8.  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 Plan outpatient biopsy and bronchoscopy 9.  Tobacco abuse.  Counsel 10.  Hyperlipidemia.  Lipitor 11.  Constipation.  last BM 7/4    LOS: 7 days A FACE TO FACE EVALUATION WAS PERFORMED  Charlett Blake 04/14/2020, 11:14 AM

## 2020-04-14 NOTE — Progress Notes (Signed)
Patient ID: Jessica [name], female   DOB: 1961-09-16, 59 y.o.   MRN: [id number]   TTB ordered through [facility]

## 2020-04-14 NOTE — Progress Notes (Signed)
Patient ID: Jessica [name], female   DOB: 1961/04/19, 59 y.o.   MRN: [id number] Team Conference Report to Patient/Family  Team Conference discussion was reviewed with the patient and caregiver, including goals, any changes in plan of care and target discharge date.  Patient and caregiver express understanding and are in agreement.  The patient has a target discharge date of 04/15/20.  Andria Rhein 04/14/2020, 1:53 PM

## 2020-04-14 NOTE — Progress Notes (Signed)
Physical Therapy Discharge Summary  Patient Details  Name: Jessica [name] MRN: [id number] Date of Birth: [birth year]  Today's Date: 04/14/2020 PT Individual Time:1553-1646 53 min       Patient has met 9 of 9 long term goals due to improved activity tolerance, improved balance, increased strength, ability to compensate for deficits, improved awareness and improved coordination.  Patient to discharge at an ambulatory level Supervision.   Patient's care partner is independent to provide the necessary physical and cognitive assistance at discharge.  Reasons goals not met: all PT goals met   Recommendation:  Patient will benefit from ongoing skilled PT services in outpatient setting to continue to advance safe functional mobility, address ongoing impairments in balance, safety, coordination, awareness , and minimize fall risk.  Equipment: No equipment provided  Reasons for discharge: treatment goals met and discharge from hospital  Patient/family agrees with progress made and goals achieved: Yes   PT treatment.   Pt received sitting in WC and agreeable to PT. Gait training through hall without AD x 268f, 30101f and 15070fith supervision assist and cues for attention to obstacles on the R. PT instructed pt in Grad day assessment to measure progress toward goals. See below for details.   Patient demonstrates increased fall risk as noted by score of  48 /56 on Berg Balance Scale.  (<36= high risk for falls, close to 100%; 37-45 significant >80%; 46-51 moderate >50%; 52-55 lower >25%)   PT instructed pt in DGI. See below for results. Demonstrates increased fall risk with score of 18/24. (<19 indicates increased fall risk)   [place] level A modified balance program instructed by PT with hand out provided by PT.   Patient returned to room and left sitting in [facility] in reach and all needs met.      PT Discharge Precautions/Restrictions Precautions Precautions: Fall Vital  Signs Therapy Vitals Pulse Rate: 87 Resp: 14 BP: 119/76 Patient Position (if appropriate): Sitting Oxygen Therapy SpO2: 97 % O2 Device: Room Air Pain Pain Assessment Pain Scale: 0-10 Pain Score: 0-No pain Vision/Perception  Vision - Assessment Eye Alignment: Within Functional Limits Ocular Range of Motion: Within Functional Limits Alignment/Gaze Preference: Within Defined Limits Tracking/Visual Pursuits: Decreased smoothness of eye movement to RIGHT inferior field;Impaired - to be further tested in functional context Saccades: Within functional limits Perception Perception: Within Functional Limits Praxis Praxis: Intact  Cognition Overall Cognitive Status: Impaired/Different from baseline Arousal/Alertness: Awake/alert Orientation Level: Oriented X4 Sustained Attention: Appears intact Memory: Impaired Memory Impairment: Retrieval deficit;Decreased recall of new information;Decreased short term memory Decreased Short Term Memory: Verbal basic;Functional basic Awareness: Impaired Awareness Impairment: Emergent impairment Problem Solving: Impaired Problem Solving Impairment: Verbal basic;Functional basic Executive Function: Reasoning;Organizing Reasoning: Impaired Organizing: Impaired Organizing Impairment: Functional complex Safety/Judgment: Impaired Sensation Sensation Light Touch: Appears Intact Hot/Cold: Appears Intact Coordination Fine Motor Movements are Fluid and Coordinated: No Coordination and Movement Description: mild dysmetria in the RUE but improved since initially evaluation Heel Shin Test: decreased speed Bil and required x2 demonstration to complete Motor  Motor Motor: Hemiplegia Motor - Discharge Observations: mild R sided hemiplegia, improved from eval  Mobility Bed Mobility Bed Mobility: Rolling Right;Rolling Left;Sit to Supine;Supine to Sit Rolling Right: Independent Rolling Left: Independent Supine to Sit: Independent Sit to Supine:  Independent Transfers Transfers: Sit to StaBank of Americaansfers Sit to Stand: Independent with assistive device Stand Pivot Transfers: Independent with assistive device Stand Pivot Transfer Details: Verbal cues for precautions/safety Transfer (Assistive device): None Locomotion  Gait Ambulation: Yes  Gait Assistance: Supervision/Verbal cueing Gait Distance (Feet): 200 Feet Assistive device: None Gait Assistance Details: Verbal cues for technique Gait Assistance Details: pt demonstrated hitting doot frame on R side with R side turn without VC Gait Gait: Yes Gait velocity: decreased cadence  Trunk/Postural Assessment  Cervical Assessment Cervical Assessment: Exceptions to [facility] (forward head) Thoracic Assessment Thoracic Assessment: Exceptions to [facility] (rounded shoulders) Lumbar Assessment Lumbar Assessment: Exceptions to [facility] Postural Control Postural Control: Deficits on evaluation  Balance Balance Balance Assessed: Yes Berg Balance Test Sit to Stand: Able to stand without using hands and stabilize independently Standing Unsupported: Able to stand safely 2 minutes Sitting with Back Unsupported but Feet Supported on Floor or Stool: Able to sit safely and securely 2 minutes Stand to Sit: Sits safely with minimal use of hands Transfers: Able to transfer safely, minor use of hands Standing Unsupported with Eyes Closed: Able to stand 10 seconds safely Standing Ubsupported with Feet Together: Able to place feet together independently and stand 1 minute safely From Standing, Reach Forward with Outstretched Arm: Can reach forward >12 cm safely (5") From Standing Position, Pick up Object from Floor: Able to pick up shoe safely and easily From Standing Position, Turn to Look Behind Over each Shoulder: Looks behind from both sides and weight shifts well Turn 360 Degrees: Able to turn 360 degrees safely in 4 seconds or less Standing Unsupported, Alternately Place Feet on Step/Stool: Able  to complete 4 steps without aid or supervision Standing Unsupported, One Foot in Front: Able to take small step independently and hold 30 seconds Standing on One Leg: Tries to lift leg/unable to hold 3 seconds but remains standing independently Total Score: 48 Dynamic Gait Index Level Surface: Normal Change in Gait Speed: Mild Impairment Gait with Horizontal Head Turns: Mild Impairment Gait with Vertical Head Turns: Mild Impairment Gait and Pivot Turn: Normal Step Over Obstacle: Moderate Impairment Step Around Obstacles: Normal Steps: Mild Impairment Total Score: 18 Static Sitting Balance Static Sitting - Level of Assistance: 7: Independent Dynamic Sitting Balance Dynamic Sitting - Level of Assistance: 7: Independent Static Standing Balance Static Standing - Balance Support: No upper extremity supported Static Standing - Level of Assistance: 6: Modified independent (Device/Increase time) Dynamic Standing Balance Dynamic Standing - Balance Support: During functional activity Dynamic Standing - Level of Assistance: 6: Modified independent (Device/Increase time) Extremity Assessment  RUE Assessment RUE Assessment: Exceptions to [facility] Passive Range of Motion (PROM) Comments: WFLs Active Range of Motion (AROM) Comments: WFLs General Strength Comments: 3+/5 LUE Assessment LUE Assessment: Within Functional Limits RLE Assessment RLE Assessment: Within Functional Limits General Strength Comments: grossly 4+/5 except knee flexion 4/5 LLE Assessment LLE Assessment: Within Functional Limits General Strength Comments: grossly 4+/5 to 5/5    Lorie Phenix 04/14/2020, 4:31 PM

## 2020-04-14 NOTE — Progress Notes (Signed)
Physical Therapy Session Note  Patient Details  Name: Jessica [name] MRN: [id number] Date of Birth: 1961/07/25  Today's Date: 04/14/2020 PT Individual Time: 0903-0930 PT Individual Time Calculation (min): 27 min   Short Term Goals: Week 1:  PT Short Term Goal 1 (Week 1): STG=LTG due ELOS  Skilled Therapeutic Interventions/Progress Updates: Pt presented in recliner with husband present agreeable to therapy. Pt denies pain during session. Session focused on family education with family. Pt performed transfers, gait, car transfer, and stairs. Husband was education on impulsivity and decreased safety awareness. Pt returned to room at end of session and remained in recliner. Pt remained in recliner with hand off to OT at end of session.      Therapy Documentation Precautions:  Precautions Precautions: Fall Restrictions Weight Bearing Restrictions: No General:   Vital Signs:  Pain: Pain Assessment Pain Scale: 0-10 Pain Score: 0-No pain    Therapy/Group: Individual Therapy  Balen Woolum  Elchonon Maxson, PTA  04/14/2020, 10:06 AM

## 2020-04-14 NOTE — Progress Notes (Signed)
Patient seen getting out of bed by nurse tech and another nurse. When the nurse tried to assist patient with walker, pt raised hands in an attempt to push nurse back. The nurse stepped back before contact. Nurse tech arrived and assisted patient to the bathroom. This is the second time the patient has gotten out of bed without assistance since beginning of shift 7pm. It has been documented patient has not complied with fall risk prevention plan. Patient stated she was aware that she needed to call the front before getting out of bed, but refuses to comply. This nurse has educated patient. Telesitter ordered for safety.    Wenonah Milo, lpn

## 2020-04-14 NOTE — Progress Notes (Signed)
Occupational Therapy Discharge Summary  Patient Details  Name: Jessica [name] MRN: [id number] Date of Birth: Feb 07, 1961  Today's Date: 04/14/2020 OT Individual Time: 0933-1000 and 1300-1408 OT Individual Time Calculation (min): 27 mins and 68 min    Patient has met 12 of 12 long term goals due to improved activity tolerance, improved balance, postural control, ability to compensate for deficits, functional use of  RIGHT upper and RIGHT lower extremity, improved attention, improved awareness and improved coordination.  Patient to discharge at overall Supervision level.  Patient's care partner is independent to provide the necessary cognitive assistance at discharge.    Reasons goals not met: all goals met  Recommendation:  Patient will benefit from ongoing skilled OT services in outpatient setting to continue to advance functional skills in the area of BADL and iADL.  Equipment: TTB  Reasons for discharge: treatment goals met  Patient/family agrees with progress made and goals achieved: Yes   OT Intervention: Session 1: Upon entering the room, pt seated in recliner chair with husband present for hands on family training. OT reviewed pt's goals and verbalized need for 24/7 supervision for safety secondary to cognition deficits. He verbalized understanding. Pt ambulating with RW 150' to ADL apartment with RW and supervision from spouse. Pt demonstrating transfer onto TTB with use of RW. Pt needing cuing to not attempt to step over ledge in order to decrease fall risk. Caregiver verbalized this equipment would be beneficial secondary to home environment set up. OT also educating pt and caregiver on importance of forced R UE use at home in order to continue to increase coordination and strength. Pt ambulating back to room and caregiver with no further questions at this time. OT did review recommendation for outpatient therapy and they were agreeable. Pt seated on EOB with bed alarm activated and  spouse present in room.   Session 2: Upon entering the room, pt supine in bed awaiting OT arrival and agreeable to OT intervention. Pt ambulating with supervision to bathroom for toileting needs. Pt managed clothing management and hygiene without assistance needed. Pt exiting the bathroom and ambulates 100' to day room. OT provided pt with paper handout for R UE coordination and strengthening HEP with use of pink theraputty. Pt needing rest breaks secondary to hand fatigue but completes exercises 1-20 as listed with min cuing for technique. Pt ambulating back to room and performs hygiene tasks at sink while standing with supervision before returning back to recliner chair. Chair alarm belt donned and activated. Call bell and all needed items within reach upon exiting the room.   OT Discharge Precautions/Restrictions  Precautions Precautions: Fall Pain Pain Assessment Pain Scale: 0-10 Pain Score: 0-No pain Vision Baseline Vision/History: Wears glasses Wears Glasses: Reading only Patient Visual Report: No change from baseline Vision Assessment?: Yes Eye Alignment: Within Functional Limits Ocular Range of Motion: Within Functional Limits Alignment/Gaze Preference: Within Defined Limits Tracking/Visual Pursuits: Decreased smoothness of eye movement to RIGHT inferior field;Impaired - to be further tested in functional context Saccades: Within functional limits Visual Fields: Right visual field deficit Cognition Overall Cognitive Status: Impaired/Different from baseline Arousal/Alertness: Awake/alert Orientation Level: Oriented X4 Sustained Attention: Appears intact Memory: Impaired Memory Impairment: Retrieval deficit;Decreased recall of new information;Decreased short term memory Decreased Short Term Memory: Verbal basic;Functional basic Awareness: Impaired Awareness Impairment: Emergent impairment Problem Solving: Impaired Problem Solving Impairment: Verbal basic;Functional  basic Executive Function: Reasoning;Organizing Reasoning: Impaired Organizing: Impaired Organizing Impairment: Functional complex Safety/Judgment: Impaired Sensation Sensation Light Touch: Appears Intact Hot/Cold: Appears Intact Coordination  Fine Motor Movements are Fluid and Coordinated: No Coordination and Movement Description: mild dysmetria in the RUE but improved since initially evaluation Mobility  Bed Mobility Bed Mobility: Rolling Right;Rolling Left;Sit to Supine;Supine to Sit Rolling Right: Independent Rolling Left: Independent Supine to Sit: Independent Sit to Supine: Independent Transfers Sit to Stand: Supervision/Verbal cueing  Trunk/Postural Assessment  Cervical Assessment Cervical Assessment: Exceptions to [facility] (forward head) Thoracic Assessment Thoracic Assessment: Exceptions to [facility] (rounded shoulders) Lumbar Assessment Lumbar Assessment: Exceptions to [facility] Postural Control Postural Control: Deficits on evaluation  Balance Balance Balance Assessed: Yes Static Sitting Balance Static Sitting - Level of Assistance: 7: Independent Dynamic Sitting Balance Dynamic Sitting - Level of Assistance: 6: Modified independent (Device/Increase time) Static Standing Balance Static Standing - Balance Support: No upper extremity supported Static Standing - Level of Assistance: 5: Stand by assistance Dynamic Standing Balance Dynamic Standing - Balance Support: During functional activity Dynamic Standing - Level of Assistance: 5: Stand by assistance Extremity/Trunk Assessment RUE Assessment RUE Assessment: Exceptions to [facility] Passive Range of Motion (PROM) Comments: WFLs Active Range of Motion (AROM) Comments: WFLs General Strength Comments: 3+/5 LUE Assessment LUE Assessment: Within Functional Limits   Gypsy Decant 04/14/2020, 2:26 PM

## 2020-04-14 NOTE — Progress Notes (Signed)
Speech Language Pathology Daily Session Note  Patient Details  Name: Aiesha Leland MRN: [id number] Date of Birth: 1961/06/22  Today's Date: 04/14/2020 SLP Individual Time: 1001-1028 SLP Individual Time Calculation (min): 27 min  Short Term Goals: Week 1: SLP Short Term Goal 1 (Week 1): STGs=LTGs due to ELOS  Skilled Therapeutic Interventions: Pt was seen for skilled ST targeting education with pt and her husband (caregiver). SLP facilitated session with functional conversation reviewing ST goals and progress. Discussed impact of reduced problem solving, short term memory, and emergent awareness on functioning. Verbal review and handout for compensatory memory strategies provided with focus on routines, avoiding multitasking and distractions, use of external aids, etc. Pt's husband acknowledged recommendation from ST for 24/7 supervision for greatest safety at home, as well as recommendation to assist with medications, finance management, and cooking. All questions answered to pt and husband's satisfaction. Pt left sitting edge of bed with alarm set and husband still present. Continue per current plan of care.        Pain Pain Assessment Pain Scale: 0-10 Pain Score: 0-No pain  Therapy/Group: Individual Therapy  Little Ishikawa 04/14/2020, 7:30 AM

## 2020-04-15 ENCOUNTER — Inpatient Hospital Stay (HOSPITAL_COMMUNITY): Payer: PRIVATE HEALTH INSURANCE | Admitting: Occupational Therapy

## 2020-04-15 ENCOUNTER — Inpatient Hospital Stay (HOSPITAL_COMMUNITY): Payer: PRIVATE HEALTH INSURANCE | Admitting: Physical Therapy

## 2020-04-15 ENCOUNTER — Inpatient Hospital Stay (HOSPITAL_COMMUNITY): Payer: PRIVATE HEALTH INSURANCE

## 2020-04-15 NOTE — Discharge Instructions (Signed)
STROKE/TIA DISCHARGE INSTRUCTIONS SMOKING Cigarette smoking nearly doubles your risk of having a stroke & is the single most alterable risk factor  If you smoke or have smoked in the last 12 months, you are advised to quit smoking for your health.  Most of the excess cardiovascular risk related to smoking disappears within a year of stopping.  Ask you doctor about anti-smoking medications  [place] Quit Line: 1-800-QUIT NOW  Free Smoking Cessation Classes [phone]  CHOLESTEROL Know your levels; limit fat & cholesterol in your diet  Lipid Panel     Component Value Date/Time   CHOL 139 04/04/2020 0321   TRIG 98 04/04/2020 0321   HDL 37 (L) 04/04/2020 0321   CHOLHDL 3.8 04/04/2020 0321   VLDL 20 04/04/2020 0321   LDLCALC 82 04/04/2020 0321      Many patients benefit from treatment even if their cholesterol is at goal.  Goal: Total Cholesterol (CHOL) less than 160  Goal:  Triglycerides (TRIG) less than 150  Goal:  HDL greater than 40  Goal:  LDL (LDLCALC) less than 100   BLOOD PRESSURE American Stroke Association blood pressure target is less that 120/80 mm/Hg  Your discharge blood pressure is:  BP: (!) 152/71  Monitor your blood pressure  Limit your salt and alcohol intake  Many individuals will require more than one medication for high blood pressure  DIABETES (A1c is a blood sugar average for last 3 months) Goal HGBA1c is under 7% (HBGA1c is blood sugar average for last 3 months)  Diabetes: No known diagnosis of diabetes    Lab Results  Component Value Date   HGBA1C 6.0 (H) 04/04/2020     Your HGBA1c can be lowered with medications, healthy diet, and exercise.  Check your blood sugar as directed by your physician  Call your physician if you experience unexplained or low blood sugars.  PHYSICAL ACTIVITY/REHABILITATION Goal is 30 minutes at least 4 days per week  Activity: Increase activity slowly, Therapies: Physical Therapy: Home Health Return to work:    Activity decreases your risk of heart attack and stroke and makes your heart stronger.  It helps control your weight and blood pressure; helps you relax and can improve your mood.  Participate in a regular exercise program.  Talk with your doctor about the best form of exercise for you (dancing, walking, swimming, cycling).  DIET/WEIGHT Goal is to maintain a healthy weight  Your discharge diet is:  Diet Order            Diet Heart Room service appropriate? Yes with Assist; Fluid consistency: Thin  Diet effective 1000                 liquids Your height is:    Your current weight is: Weight: 68.4 kg Your Body Mass Index (BMI) is:  BMI (Calculated): 25.09  Following the type of diet specifically designed for you will help prevent another stroke.  Your goal weight range is:    Your goal Body Mass Index (BMI) is 19-24.  Healthy food habits can help reduce 3 risk factors for stroke:  High cholesterol, hypertension, and excess weight.  RESOURCES Stroke/Support Group:  Call [phone]   STROKE EDUCATION PROVIDED/REVIEWED AND GIVEN TO PATIENT Stroke warning signs and symptoms How to activate emergency medical system (call 911). Medications prescribed at discharge. Need for follow-up after discharge. Personal risk factors for stroke. Pneumonia vaccine given:  Flu vaccine given:  My questions have been answered, the writing is legible, and  I understand these instructions.  I will adhere to these goals & educational materials that have been provided to me after my discharge from the hospital.   Inpatient Rehab Discharge Instructions  Austen Wygant Discharge date and time: No discharge date for patient encounter.   Activities/Precautions/ Functional Status: Activity: activity as tolerated Diet: regular diet Wound Care: none needed Functional status:  ___ No restrictions     ___ Walk up steps independently ___ 24/7 supervision/assistance   ___ Walk up steps with assistance ___  Intermittent supervision/assistance  ___ Bathe/dress independently ___ Walk with walker     _x__ Bathe/dress with assistance ___ Walk Independently    ___ Shower independently ___ Walk with assistance    ___ Shower with assistance ___ No alcohol     ___ Return to work/school ________ COMMUNITY REFERRALS UPON DISCHARGE:    Outpatient: PT     OT    ST                Agency: [facility] Phone: [phone]              Appointment Date/Time: Facility to Determine   Medical Equipment/Items Ordered: Tub Transfer Bench                                                 Agency/Supplier: [employer]   Special Instructions: No driving smoking or alcohol  Follow-up pulmonary services in regards to lung nodule biopsy [phone]   My questions have been answered and I understand these instructions. I will adhere to these goals and the provided educational materials after my discharge from the hospital.  Patient/Caregiver Signature _______________________________ Date __________  Clinician Signature _______________________________________ Date __________  Please bring this form and your medication list with you to all your follow-up doctor's appointments.

## 2020-04-15 NOTE — Progress Notes (Signed)
Patient discharged off of unit with all belongings. Discharge papers/instructions explained by physician assistant to family. Patient and family have no further questions at time of discharge. No complications noted at this time.  Jessica [name]  

## 2020-04-15 NOTE — Progress Notes (Signed)
[place] PHYSICAL MEDICINE & REHABILITATION PROGRESS NOTE   Subjective/Complaints:  Completed family ed , no new issues overnite , no breathing problems   ROS: Patient denies CP, SOB, N/V/D  Objective:   No results found. No results for input(s): WBC, HGB, HCT, PLT in the last 72 hours. No results for input(s): NA, K, CL, CO2, GLUCOSE, BUN, CREATININE, CALCIUM in the last 72 hours.  Intake/Output Summary (Last 24 hours) at 04/15/2020 0834 Last data filed at 04/14/2020 1700 Gross per 24 hour  Intake 480 ml  Output --  Net 480 ml     Physical Exam: Vital Signs Blood pressure 108/80, pulse (!) 103, temperature 98.3 F (36.8 C), resp. rate 16, height 5\' 5"  (1.651 m), weight 68.4 kg, SpO2 93 %.    General: No acute distress Mood and affect are appropriate Heart: Regular rate and rhythm no rubs murmurs or extra sounds Lungs: Clear to auscultation, breathing unlabored, no rales or wheezes Abdomen: Positive bowel sounds, soft nontender to palpation, nondistended Extremities: No clubbing, cyanosis, or edema Skin: No evidence of breakdown, no evidence of rash      ~1.5-2.0 cm diameter dark pink firm, tender, central area darker, no drainage no surrounding induration      Motor 4/5 RIght delt, bi, tri, grip, HF, KE, ADF, 5/5 Left delt bi tri grip  HF, KE ADF redcued FM Right hand--no change Sensation to LT equal BUE   Assessment/Plan: 1. 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   Stable for D/C today F/u PCP in 3-4 weeks F/u PM&R 2 weeks F/u pulmonary 1-2 wks Dr See D/C summary See D/C instructions Care Tool:  Bathing    Body parts bathed by patient: Right arm, Left arm, Chest, Abdomen, Front perineal area, Buttocks, Right upper leg, Left upper leg, Right lower leg, Left lower leg, Face         Bathing assist Assist Level: Supervision/Verbal cueing     Upper Body Dressing/Undressing Upper body dressing   What is the patient wearing?: Dress     Upper body assist Assist Level: Supervision/Verbal cueing    Lower Body Dressing/Undressing Lower body dressing      What is the patient wearing?: Underwear/pull up     Lower body assist Assist for lower body dressing: Supervision/Verbal cueing     Toileting Toileting Toileting Activity did not occur (Clothing management and hygiene only): N/A (no void or bm)  Toileting assist Assist for toileting: Supervision/Verbal cueing     Transfers Chair/bed transfer  Transfers assist     Chair/bed transfer assist level: Supervision/Verbal cueing     Locomotion Ambulation   Ambulation assist      Assist level: Supervision/Verbal cueing Assistive device: Walker-rolling Max distance: 272ft   Walk 10 feet activity   Assist     Assist level: Supervision/Verbal cueing Assistive device: Walker-rolling   Walk 50 feet activity   Assist    Assist level: Supervision/Verbal cueing Assistive device: Walker-rolling    Walk 150 feet activity   Assist    Assist level: Supervision/Verbal cueing Assistive device: Walker-rolling    Walk 10 feet on uneven surface  activity   Assist     Assist level: Supervision/Verbal cueing Assistive device: 120f Will patient use wheelchair at discharge?: No      Wheelchair assist level: Minimal Assistance - Patient > 75% Max wheelchair distance: 150    Wheelchair 50 feet with 2 turns activity    Assist  Assist Level: Minimal Assistance - Patient > 75%   Wheelchair 150 feet activity     Assist      Assist Level: Minimal Assistance - Patient > 75%   Blood pressure 108/80, pulse (!) 103, temperature 98.3 F (36.8 C), resp. rate 16, height 5\' 5"  (1.651 m), weight 68.4 kg, SpO2 93 %.  Medical Problem List and Plan: 1.  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  ?cardioembolic vs hypercoagulable state related to  pulmonary malignancy Left frontal most symptomatic also with  RIght parietal              -discharge home today with husband  2.  Antithrombotics: -DVT/anticoagulation: SCDs             -antiplatelet therapy: Aspirin 81 mg daily.  Plavix on hold for planned outpatient biopsy and bronchoscopy 3. Pain Management: Tylenol as needed 4. Mood: Provide emotional support             -antipsychotic agents: N/A 5. Neuropsych: This patient is capable of making decisions on her own behalf. 6. Skin/Wound Care: RIght breast lesion does not appear to be a cyst, , does not appear to be infected, on CT chest looks superficial, Derm as OP tod eval  for biopsy per primary team    7. Fluids/Electrolytes/Nutrition: Routine in and outs.  CMP ordered.intake inconsistent  Filed Weights   04/07/20 1548  Weight: 68.4 kg   weekly weight  8.  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 Plan outpatient biopsy and bronchoscopy 9.  Tobacco abuse.  Counsel 10.  Hyperlipidemia.  Lipitor 11.  Constipation.  last BM 7/4    LOS: 8 days A FACE TO FACE EVALUATION WAS PERFORMED  04/09/20 04/15/2020, 8:34 AM

## 2020-04-15 NOTE — Progress Notes (Signed)
Inpatient Rehabilitation Care Coordinator  Discharge Note  The overall goal for the admission was met for:   Discharge location: Yes, Home  Length of Stay: Yes, 8 Days  Discharge activity level: Yes, ambulatory supervision   Home/community participation: Yes  Services provided included: MD, RD, PT, OT, SLP, RN, CM, TR, Pharmacy, Neuropsych and SW  Financial Services: Other: [employer]  Follow-up services arranged: Outpatient: [facility]  Comments (or additional information): PT OT ST  Patient/Family verbalized understanding of follow-up arrangements: Yes  Individual responsible for coordination of the follow-up plan: Felicity Coyer [phone]  Confirmed correct DME delivered: Dyanne Iha 04/15/2020    Dyanne Iha

## 2020-04-15 NOTE — Plan of Care (Signed)
  Problem: Consults Goal: RH STROKE PATIENT EDUCATION Description: See Patient Education module for education specifics  Outcome: Completed/Met   Problem: RH BOWEL ELIMINATION Goal: RH STG MANAGE BOWEL WITH ASSISTANCE Description: STG Manage Bowel with mod I Assistance. Outcome: Completed/Met Goal: RH STG MANAGE BOWEL W/MEDICATION W/ASSISTANCE Description: STG Manage Bowel with Medication with MOD I Assistance. Outcome: Completed/Met   Problem: RH BLADDER ELIMINATION Goal: RH STG MANAGE BLADDER WITH ASSISTANCE Description: STG Manage Bladder With mod I Assistance Outcome: Completed/Met   Problem: RH SKIN INTEGRITY Goal: RH STG SKIN FREE OF INFECTION/BREAKDOWN Description: Skin will be free of infection/breakdown with MOD I assist Outcome: Completed/Met Goal: RH STG MAINTAIN SKIN INTEGRITY WITH ASSISTANCE Description: STG Maintain Skin Integrity With MOD I Assistance. Outcome: Completed/Met   Problem: RH SAFETY Goal: RH STG ADHERE TO SAFETY PRECAUTIONS W/ASSISTANCE/DEVICE Description: STG Adhere to Safety Precautions With cues/reminders Assistance/Device. Outcome: Completed/Met   Problem: RH COGNITION-NURSING Goal: RH STG USES MEMORY AIDS/STRATEGIES W/ASSIST TO PROBLEM SOLVE Description: STG Uses Memory Aids/Strategies With Assistance to Problem Solve with supervision/cues assit . Outcome: Completed/Met   Problem: RH PAIN MANAGEMENT Goal: RH STG PAIN MANAGED AT OR BELOW PT'S PAIN GOAL Description: Pt will report pain to staff < 4 Outcome: Completed/Met   Problem: RH KNOWLEDGE DEFICIT Goal: RH STG INCREASE KNOWLEDGE OF HYPERTENSION Description: Pt will be able to identify medications for hypertension, when to take medications and to identify pts normal b/p for home care using handouts/education independently Outcome: Completed/Met Goal: RH STG INCREASE KNOWLEDGE OF STROKE PROPHYLAXIS Description: Pt will be able to read material provided for stroke prophylaxis and identify  medications for stroke prophylaxis and when to start medication for home care with cues/reminders assist Outcome: Completed/Met

## 2020-04-19 ENCOUNTER — Telehealth: Payer: Self-pay | Admitting: *Deleted

## 2020-04-19 NOTE — Telephone Encounter (Signed)
I called to do Transitional Care Call. Jessica [name]'s daughter answered and said that her mother is back in the hospital at [facility]. She reports that she had her dad take her to the hospital at 4 am this morning because something was wrong with her lungs and she reports that they feel she was released from [facility] too soon. She says they are not sure she "will make it" and was very tearful. I told her I was so sorry for her situation and will let the physicians know.

## 2020-04-21 NOTE — Telephone Encounter (Signed)
Pt with known pulmonary malignancy and is awaiting Pulmonary f/u.

## 2020-04-28 ENCOUNTER — Encounter: Payer: PRIVATE HEALTH INSURANCE | Admitting: Registered Nurse

## 2020-08-09 DEATH — deceased
